# Patient Record
Sex: Male | Born: 1955
Health system: Southern US, Community
[De-identification: ages and names within clinical notes are randomized; demographics above are authoritative.]

## PROBLEM LIST (undated history)

## (undated) DIAGNOSIS — F329 Major depressive disorder, single episode, unspecified: Secondary | ICD-10-CM

## (undated) DIAGNOSIS — G4730 Sleep apnea, unspecified: Secondary | ICD-10-CM

## (undated) DIAGNOSIS — M75102 Unspecified rotator cuff tear or rupture of left shoulder, not specified as traumatic: Secondary | ICD-10-CM

## (undated) DIAGNOSIS — M1990 Unspecified osteoarthritis, unspecified site: Secondary | ICD-10-CM

## (undated) DIAGNOSIS — E039 Hypothyroidism, unspecified: Secondary | ICD-10-CM

## (undated) DIAGNOSIS — M5030 Other cervical disc degeneration, unspecified cervical region: Secondary | ICD-10-CM

## (undated) DIAGNOSIS — R7303 Prediabetes: Secondary | ICD-10-CM

## (undated) DIAGNOSIS — F32A Depression, unspecified: Secondary | ICD-10-CM

## (undated) DIAGNOSIS — I8390 Asymptomatic varicose veins of unspecified lower extremity: Secondary | ICD-10-CM

## (undated) DIAGNOSIS — G5603 Carpal tunnel syndrome, bilateral upper limbs: Secondary | ICD-10-CM

## (undated) DIAGNOSIS — N400 Enlarged prostate without lower urinary tract symptoms: Secondary | ICD-10-CM

## (undated) DIAGNOSIS — E785 Hyperlipidemia, unspecified: Secondary | ICD-10-CM

## (undated) DIAGNOSIS — F909 Attention-deficit hyperactivity disorder, unspecified type: Secondary | ICD-10-CM

## (undated) DIAGNOSIS — R000 Tachycardia, unspecified: Secondary | ICD-10-CM

## (undated) DIAGNOSIS — R7301 Impaired fasting glucose: Secondary | ICD-10-CM

## (undated) DIAGNOSIS — M069 Rheumatoid arthritis, unspecified: Secondary | ICD-10-CM

## (undated) DIAGNOSIS — F419 Anxiety disorder, unspecified: Secondary | ICD-10-CM

## (undated) DIAGNOSIS — R9439 Abnormal result of other cardiovascular function study: Secondary | ICD-10-CM

## (undated) DIAGNOSIS — D649 Anemia, unspecified: Secondary | ICD-10-CM

## (undated) HISTORY — DX: Rheumatoid arthritis, unspecified: M06.9

## (undated) HISTORY — PX: HAMMER TOE SURGERY: SHX385

## (undated) HISTORY — DX: Tachycardia, unspecified: R00.0

## (undated) HISTORY — DX: Unspecified rotator cuff tear or rupture of left shoulder, not specified as traumatic: M75.102

## (undated) HISTORY — DX: Unspecified osteoarthritis, unspecified site: M19.90

## (undated) HISTORY — DX: Carpal tunnel syndrome, bilateral upper limbs: G56.03

## (undated) HISTORY — DX: Depression, unspecified: F32.A

## (undated) HISTORY — DX: Other cervical disc degeneration, unspecified cervical region: M50.30

## (undated) HISTORY — DX: Anxiety disorder, unspecified: F41.9

## (undated) HISTORY — PX: ELBOW SURGERY: SHX618

## (undated) HISTORY — DX: Abnormal result of other cardiovascular function study: R94.39

## (undated) HISTORY — DX: Prediabetes: R73.03

## (undated) HISTORY — DX: Attention-deficit hyperactivity disorder, unspecified type: F90.9

## (undated) HISTORY — DX: Hypothyroidism, unspecified: E03.9

## (undated) HISTORY — PX: WRIST SURGERY: SHX841

## (undated) HISTORY — DX: Anemia, unspecified: D64.9

## (undated) HISTORY — DX: Sleep apnea, unspecified: G47.30

## (undated) HISTORY — DX: Benign prostatic hyperplasia without lower urinary tract symptoms: N40.0

## (undated) HISTORY — DX: Major depressive disorder, single episode, unspecified: F32.9

## (undated) HISTORY — DX: Asymptomatic varicose veins of unspecified lower extremity: I83.90

## (undated) HISTORY — DX: Hyperlipidemia, unspecified: E78.5

## (undated) HISTORY — PX: HUMERUS SURGERY: SHX672

## (undated) HISTORY — PX: BUNIONECTOMY: SHX129

## (undated) HISTORY — DX: Impaired fasting glucose: R73.01

---

## 2002-11-12 ENCOUNTER — Encounter: Admission: RE | Admit: 2002-11-12 | Discharge: 2002-11-12 | Payer: Self-pay | Admitting: Family Medicine

## 2002-11-12 ENCOUNTER — Encounter: Payer: Self-pay | Admitting: Family Medicine

## 2003-04-28 ENCOUNTER — Ambulatory Visit (HOSPITAL_COMMUNITY): Admission: RE | Admit: 2003-04-28 | Discharge: 2003-04-28 | Payer: Self-pay | Admitting: Gastroenterology

## 2004-01-18 ENCOUNTER — Encounter: Admission: RE | Admit: 2004-01-18 | Discharge: 2004-04-17 | Payer: Self-pay | Admitting: Family Medicine

## 2010-12-03 HISTORY — PX: SHOULDER ARTHROSCOPY W/ ROTATOR CUFF REPAIR: SHX2400

## 2010-12-19 ENCOUNTER — Ambulatory Visit
Admission: RE | Admit: 2010-12-19 | Discharge: 2010-12-20 | Payer: Self-pay | Source: Home / Self Care | Attending: Orthopedic Surgery | Admitting: Orthopedic Surgery

## 2011-04-03 HISTORY — PX: SHOULDER ARTHROSCOPY W/ ROTATOR CUFF REPAIR: SHX2400

## 2011-04-05 ENCOUNTER — Other Ambulatory Visit: Payer: Self-pay | Admitting: Orthopedic Surgery

## 2011-04-05 ENCOUNTER — Ambulatory Visit (HOSPITAL_BASED_OUTPATIENT_CLINIC_OR_DEPARTMENT_OTHER)
Admission: RE | Admit: 2011-04-05 | Discharge: 2011-04-06 | Disposition: A | Discharge status: Home / Self Care | Payer: BC Managed Care – PPO | Source: Ambulatory Visit | Attending: Orthopedic Surgery | Admitting: Orthopedic Surgery

## 2011-04-05 DIAGNOSIS — M75110 Incomplete rotator cuff tear or rupture of unspecified shoulder, not specified as traumatic: Secondary | ICD-10-CM | POA: Insufficient documentation

## 2011-04-05 DIAGNOSIS — Z01812 Encounter for preprocedural laboratory examination: Secondary | ICD-10-CM | POA: Insufficient documentation

## 2011-04-05 DIAGNOSIS — Z5333 Arthroscopic surgical procedure converted to open procedure: Secondary | ICD-10-CM | POA: Insufficient documentation

## 2011-04-05 LAB — POCT HEMOGLOBIN-HEMACUE: Hemoglobin: 14.1 g/dL (ref 13.0–17.0)

## 2011-04-12 NOTE — Op Note (Signed)
Paul [name], [name] NO.:  [id number]  MEDICAL RECORD NO.:  [id number]          PATIENT TYPE:  LOCATION:                                 FACILITY:  PHYSICIAN:  Katy Fitch. Kein Carlberg, M.D. DATE OF BIRTH:  [birth year]  DATE OF PROCEDURE:  04/05/2011 DATE OF DISCHARGE:                              OPERATIVE REPORT   PREOPERATIVE DIAGNOSIS:  Left rotator cuff tear with MRI documented partial tear of long head of biceps.  POSTOPERATIVE DIAGNOSES: 1. Grade 1-2 tear of subscapularis with minimal retraction. 2. Complete tear of supraspinatus with more than 3-cm retraction. 3. Partial tear of infraspinatus with more than 2-cm retraction with a     chronic degenerative pattern. 4. Also, inspection of acromioclavicular joint identifying no evidence     of acromioclavicular impingement.  OPERATIONS: 1. Diagnostic arthroscopy, left shoulder. 2. Arthroscopic debridement of intraarticular fragments of long head     of biceps and deep surface of rotator cuff tear including     subscapularis, supraspinatus, infraspinatus. 3. Subacromial decompression with bursectomy and partial     coracoacromial ligament release. 4. Open long head of biceps tenodesis to bicipital groove with a     grasping suture technique to rotator cuff. 5. Open reconstruction of retracted 2 tendon rotator cuff tear to     decorticated and profile-lowered greater tuberosity.  OPERATIONS:  Katy Fitch. Elvert Cumpton, MD  ASSISTANT:  Marveen Reeks Dasnoit, PA  ANESTHESIA:  General endotracheal supplemented by a left interscalene block.  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 Catha Gosselin for evaluation and management of bilateral shoulder pain.  Paul [name] was noted to have significant weakness of abduction, external rotation of both shoulders, and underwent in a staged manner bilateral  MRIs.  On the right, he had a retracted rotator cuff tear with subscapularis partial tear, very unfavorable AC anatomy, and underwent an arthroscopic decompression, subacromial debridement, open reconstruction of the rotator cuff, and open distal clavicle resection. He is doing very well on the right.  He then requested similar surgery on the left side after an MRI documented a large retracted rotator cuff tear.  His AC anatomy was more favorable on the left.  After detailed informed consent, he was brought to the operating room at this time, anticipating diagnostic arthroscopy of the left shoulder. Arthroscopic debridement of his long head of biceps, open reconstruction of the rotator cuff, and management of labral pathology and tendon pathology as our findings indicate.  Preoperatively, he was reminded of the potential risks and benefits of surgery.  Paul [name] is a Jehovah's Witness, and we have agreed to respect his choice to have no blood products.  We had discussed in detail with Paul [name] the plan for surgery which gently does not ever require blood transfusion.  Should we encounter very difficult bleeding, our choice would be to terminate surgery rather than to proceed with use of blood products.  Questions were invited and answered in detail at the office and in the holding area prior to surgery.  DESCRIPTION OF PROCEDURE:  Paul [name] was interviewed by Dr. Gypsy Balsam of Anesthesia and after detailed informed consent, general anesthesia by endotracheal technique supplemented by a left interscalene block was recommended and accepted by Paul [name].  He was brought to room #1 of the [facility] where under Dr. Burnett Corrente direct supervision, general anesthesia by endotracheal technique was induced.  He was carefully positioned in a beach-chair position with aid of a torso and head holder designed for shoulder arthroscopy.  One gram of Ancef was administered as  an IV prophylactic antibiotic.  Paul [name] was carefully positioned in a beach-chair position with aid of a torso and head holder designed for shoulder arthroscopy with passive compression devices on his calves for prevention of deep vein thrombosis.  The entire left upper extremity forequarter was prepped with DuraPrep and draped with impervious arthroscopy drapes.  The procedure was commenced with a routine surgical time-out.  Once this was accomplished, the arthroscope was introduced through a standard posterior viewing portal.  Diagnostic arthroscopy confirmed a near complete tear of long head of the biceps with fragments of the tendon hanging within the joint.  The rotator cuff was inspected and the subscapularis noted to have a 25% partial-thickness tear superiorly that was retracted.  There is reactive synovitis.  The supraspinatus was completely avulsed and retracted about 3 cm.  The infraspinatus was degenerative and retracted with the anterior 50% off the greater tuberosity.  There was a large reactive osteophyte at the greater tuberosity.  An anterior portal was created under direct vision followed by use of a 5.5-mm suction shaver to debride the rotator cuff.  The stump of long head of the biceps and to perform an anterior synovectomy, the free margin of the subscapularis were smoothed to a stable margin.  We elected not to perform a proper subscapularis repair as it was a partial tear.  The scope was then removed glenohumeral joint and placed in a subacromial space.  Florid bursitis was noted.  Thorough bursectomy was accomplished followed by meticulous inspection of the [facility] joint and subacromial anatomy.  There was anterolateral osteophyte that was reactive.  This was leveled to a type 1 morphology with a suction shaver and hemostasis achieved with bipolar cautery.  We noted no contour issues with the distal clavicle or medial acromion.  Therefore, I elected not to  perform distal clavicle resection.  Preoperative plain films and MRI did not document AC impingement.  After hemostasis was achieved with bipolar cautery, the scope was removed from the subacromial space.  A 4-cm anterior middle third deltoid splitting incision was fashioned followed by identification of a very hypertrophic bursa.  We encountered some problematic bleeding from the acromial branch of the coracoacromial artery which was dissected free, identified, clamped, and electrocauterized.  The bursa was extremely hypertrophic, therefore a bursectomy was accomplished with scissors and forceps followed by hemostasis.  The feeding vessels of the bursa were identified and electrocauterized.  The greater tuberosity had a large reactive osteophyte which was lowered 3-4 mm with a power burr.  A bleeding bone surface was created from the bicipital groove posteriorly to the posterior third of the infraspinatus.  The cuff morphology was studied and found to be a wide retracted U- shaped tear.  We are able to converge the anterior leaflet and the posterior leaflet by placement of fiber tape, 2 mattress sutures that were then used as a baseball stitch to close the margins of the tear and lateralize the tear.  A bowel corkscrew was placed  at the posterior aspect of the supraspinatus insertion to create a medial footprint.  A pair of mattress sutures were placed for proper medial inset.  The rotator cuff was then advanced with margin convergence and lateralization followed by inset of the cuff with a swivel lock laterally.  The 2 mattress sutures were then tied, creating excellent medial footprint.  The tails were secured with swivel lock and push lock fixation laterally.  A very satisfactory low-profile repair was achieved.  A hand rasp was used to feather the edge of the lateral acromion to a smooth and blunt margin.  Hemostasis was confirmed followed by repair of the deltoid split with  interrupted suture of 0 Vicryl followed by repair of the skin and subcutaneous suture of 2-0 Vicryl and intradermal 3-0 Prolene with Steri-Strips.  There were no apparent complications.  For aftercare, Paul [name] was placed in a large sling.  He was awakened from general anesthesia and transferred to the recovery room with stable vital signs.  He will be admitted to the recovery care center for observation of his vital signs.  IV antibiotics in the form of Ancef 1 g IV q.8 hours x3 doses and appropriate p.o. and IV analgesics.     Katy Fitch Julien Oscar, M.D.     RVS/MEDQ  D:  04/05/2011  T:  04/06/2011  Job:  [id number]  cc:   Caryn Bee L. Little, M.D.  Electronically Signed by Josephine Igo M.D. on 04/12/2011 12:20:24 PM
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
## 2014-09-24 ENCOUNTER — Encounter: Payer: Self-pay | Admitting: *Deleted

## 2016-06-06 ENCOUNTER — Emergency Department (HOSPITAL_COMMUNITY): Payer: BLUE CROSS/BLUE SHIELD

## 2016-06-06 ENCOUNTER — Encounter (HOSPITAL_COMMUNITY): Payer: Self-pay | Admitting: Emergency Medicine

## 2016-06-06 ENCOUNTER — Emergency Department (HOSPITAL_COMMUNITY)
Admission: EM | Admit: 2016-06-06 | Discharge: 2016-06-06 | Disposition: A | Discharge status: Home / Self Care | Payer: BLUE CROSS/BLUE SHIELD | Attending: Emergency Medicine | Admitting: Emergency Medicine

## 2016-06-06 DIAGNOSIS — E785 Hyperlipidemia, unspecified: Secondary | ICD-10-CM | POA: Diagnosis not present

## 2016-06-06 DIAGNOSIS — Z87891 Personal history of nicotine dependence: Secondary | ICD-10-CM | POA: Insufficient documentation

## 2016-06-06 DIAGNOSIS — R0789 Other chest pain: Secondary | ICD-10-CM | POA: Diagnosis not present

## 2016-06-06 DIAGNOSIS — E039 Hypothyroidism, unspecified: Secondary | ICD-10-CM | POA: Insufficient documentation

## 2016-06-06 DIAGNOSIS — Z79899 Other long term (current) drug therapy: Secondary | ICD-10-CM | POA: Diagnosis not present

## 2016-06-06 DIAGNOSIS — R0602 Shortness of breath: Secondary | ICD-10-CM | POA: Diagnosis not present

## 2016-06-06 DIAGNOSIS — Z7982 Long term (current) use of aspirin: Secondary | ICD-10-CM | POA: Insufficient documentation

## 2016-06-06 LAB — CBC
HEMATOCRIT: 38.1 % — AB (ref 39.0–52.0)
HEMOGLOBIN: 13.3 g/dL (ref 13.0–17.0)
MCH: 31.1 pg (ref 26.0–34.0)
MCHC: 34.9 g/dL (ref 30.0–36.0)
MCV: 89 fL (ref 78.0–100.0)
Platelets: 269 10*3/uL (ref 150–400)
RBC: 4.28 MIL/uL (ref 4.22–5.81)
RDW: 12.6 % (ref 11.5–15.5)
WBC: 5.2 10*3/uL (ref 4.0–10.5)

## 2016-06-06 LAB — BASIC METABOLIC PANEL
ANION GAP: 5 (ref 5–15)
BUN: 19 mg/dL (ref 6–20)
CHLORIDE: 101 mmol/L (ref 101–111)
CO2: 28 mmol/L (ref 22–32)
Calcium: 9.1 mg/dL (ref 8.9–10.3)
Creatinine, Ser: 1.05 mg/dL (ref 0.61–1.24)
GFR calc non Af Amer: >60 mL/min (ref >60)
Glucose, Bld: 109 mg/dL — ABNORMAL HIGH (ref 65–99)
POTASSIUM: 5.1 mmol/L (ref 3.5–5.1)
Sodium: 134 mmol/L — ABNORMAL LOW (ref 135–145)

## 2016-06-06 LAB — I-STAT TROPONIN, ED: Troponin i, poc: 0 ng/mL (ref 0.00–0.08)

## 2016-06-06 NOTE — ED Notes (Signed)
Patient transported to X-ray 

## 2016-06-06 NOTE — ED Notes (Signed)
Pt c/o sharp centralized chest pain since Sunday. Pt reports SOB but denies N/V.

## 2016-06-06 NOTE — ED Provider Notes (Signed)
CSN: [id number]     Arrival date & time 06/06/16  1009 History   First MD Initiated Contact with Patient 06/06/16 1028     Chief Complaint  Patient presents with  . Chest Pain     (Consider location/radiation/quality/duration/timing/severity/associated sxs/prior Treatment) Patient is a 60 y.o. male presenting with chest pain. The history is provided by the patient.  Chest Pain Associated symptoms: no abdominal pain, no back pain, no cough, no fever, no headache, no palpitations, no shortness of breath and not vomiting   Patient c/o a few episodes sudden sharp pain in mid chest, in the past week, each episode lasting 2-3 seconds. Occurred at rest, once while driving, another time at rest. No exertional cp or discomfort. Felt mildly sob.  No palpitations. No syncope. No hx cad. Father w hx cabg in his mid 6750's. Patient reports neg stress test several yrs ago. No hx cath. Non smoker. No cough or uri c/o. No fever or chills.       Past Medical History  Diagnosis Date  . Dyslipidemia   . Hypothyroidism   . BPH (benign prostatic hyperplasia)   . Varicose veins    History reviewed. No pertinent past surgical history. Family History  Problem Relation Age of Onset  . Family history unknown: Yes   Social History  Substance Use Topics  . Smoking status: Former Games developermoker  . Smokeless tobacco: None  . Alcohol Use: Yes    Review of Systems  Constitutional: Negative for fever and chills.  HENT: Negative for sore throat.   Eyes: Negative for redness.  Respiratory: Negative for cough and shortness of breath.   Cardiovascular: Positive for chest pain. Negative for palpitations and leg swelling.  Gastrointestinal: Negative for vomiting, abdominal pain and diarrhea.  Genitourinary: Negative for flank pain.  Musculoskeletal: Negative for back pain and neck pain.  Skin: Negative for rash.  Neurological: Negative for headaches.  Hematological: Does not bruise/bleed easily.   Psychiatric/Behavioral: Negative for confusion.      Allergies  Review of patient's allergies indicates no known allergies.  Home Medications   Prior to Admission medications   Medication Sig Start Date End Date Taking? Authorizing Provider  ALPRAZolam Prudy Feeler(XANAX) 0.25 MG tablet Take 0.25 mg by mouth at bedtime as needed for anxiety.    Historical Provider, MD  aspirin 81 MG chewable tablet Chew by mouth daily.    Historical Provider, MD  atorvastatin (LIPITOR) 20 MG tablet Take 20 mg by mouth daily.    Historical Provider, MD  b complex vitamins tablet Take 1 tablet by mouth daily.    Historical Provider, MD  Coenzyme Q10 10 MG capsule Take 10 mg by mouth daily.    Historical Provider, MD  levothyroxine (SYNTHROID, LEVOTHROID) 100 MCG tablet Take 100 mcg by mouth daily before breakfast.    Historical Provider, MD  naproxen (NAPROSYN) 500 MG tablet Take 500 mg by mouth 2 (two) times daily with a meal.    Historical Provider, MD   BP 133/90 mmHg  Pulse 58  Temp(Src) 97.3 F (36.3 C) (Oral)  Resp 14  SpO2 99% Physical Exam  Constitutional: He is oriented to person, place, and time. He appears well-developed and well-nourished. No distress.  HENT:  Mouth/Throat: Oropharynx is clear and moist.  Eyes: Conjunctivae are normal. No scleral icterus.  Neck: Neck supple. No tracheal deviation present.  Cardiovascular: Normal rate, regular rhythm, normal heart sounds and intact distal pulses.  Exam reveals no gallop and no friction rub.  No murmur heard. Pulmonary/Chest: Effort normal and breath sounds normal. No accessory muscle usage. No respiratory distress. He exhibits no tenderness.  Abdominal: Soft. Bowel sounds are normal. He exhibits no distension. There is no tenderness.  Musculoskeletal: Normal range of motion. He exhibits no edema or tenderness.  Neurological: He is alert and oriented to person, place, and time.  Skin: Skin is warm and dry. He is not diaphoretic.  Psychiatric: He  has a normal mood and affect.  Nursing note and vitals reviewed.   ED Course  Procedures (including critical care time) Labs Review   Results for orders placed or performed during the hospital encounter of 06/06/16  Basic metabolic panel  Result Value Ref Range   Sodium 134 (L) 135 - 145 mmol/L   Potassium 5.1 3.5 - 5.1 mmol/L   Chloride 101 101 - 111 mmol/L   CO2 28 22 - 32 mmol/L   Glucose, Bld 109 (H) 65 - 99 mg/dL   BUN 19 6 - 20 mg/dL   Creatinine, Ser 1.611.05 0.61 - 1.24 mg/dL   Calcium 9.1 8.9 - 09.610.3 mg/dL   GFR calc non Af Amer >60 >60 mL/min   GFR calc Af Amer >60 >60 mL/min   Anion gap 5 5 - 15  CBC  Result Value Ref Range   WBC 5.2 4.0 - 10.5 K/uL   RBC 4.28 4.22 - 5.81 MIL/uL   Hemoglobin 13.3 13.0 - 17.0 g/dL   HCT 04.538.1 (L) 40.939.0 - 81.152.0 %   MCV 89.0 78.0 - 100.0 fL   MCH 31.1 26.0 - 34.0 pg   MCHC 34.9 30.0 - 36.0 g/dL   RDW 91.412.6 78.211.5 - 95.615.5 %   Platelets 269 150 - 400 K/uL  I-stat troponin, ED  Result Value Ref Range   Troponin i, poc 0.00 0.00 - 0.08 ng/mL   Comment 3           Dg Chest 2 View  06/06/2016  CLINICAL DATA:  Shortness of breath EXAM: CHEST  2 VIEW COMPARISON:  None. FINDINGS: Normal heart size and mediastinal contours. No acute infiltrate or edema. No effusion or pneumothorax. No acute osseous findings. IMPRESSION: No active cardiopulmonary disease. Electronically Signed   By: Marnee SpringJonathon  Watts M.D.   On: 06/06/2016 10:40       I have personally reviewed and evaluated these images and lab results as part of my medical decision-making.   EKG Interpretation   Date/Time:  Wednesday June 06 2016 10:19:39 EDT Ventricular Rate:  55 PR Interval:    QRS Duration: 104 QT Interval:  412 QTC Calculation: 394 R Axis:   26 Text Interpretation:  Sinus rhythm No significant change since last  tracing Confirmed by Denton LankSTEINL  MD, Caryn BeeKEVIN ([id number]) on 06/06/2016 10:29:20 AM  Also confirmed by Denton LankSTEINL  MD, Kyrel Leighton ([id number]), editor BREWER, CCT, GLENN  ([id number])  on  06/06/2016 10:30:12 AM      MDM   Iv ns. Monitor. Labs. Ecg.  Reviewed nursing notes and prior charts for additional history.   Recheck, no current cp or discomfort, and prior chest pain very atypical, lasting about 3 seconds.  Patient current symptom free, trop 0, and appears stable for d/c.   Given fam hx, will refer to cardiology f/u, possible outpatient stress testing.       Cathren LaineKevin Kayleena Eke, MD 06/06/16 [id number]

## 2016-06-06 NOTE — Discharge Instructions (Signed)
It was our pleasure to provide your ER care today - we hope that you feel better.  Follow up with cardiologist in the next 1-2 weeks - see referral - call office to arrange appointment.  Return to ER if worse, recurrent or persistent chest pain, increased difficulty breathing, weak/fainting, or other medical emergency.      Nonspecific Chest Pain  Chest pain can be caused by many different conditions. There is always a chance that your pain could be related to something serious, such as a heart attack or a blood clot in your lungs. Chest pain can also be caused by conditions that are not life-threatening. If you have chest pain, it is very important to follow up with your health care provider. CAUSES  Chest pain can be caused by:  Heartburn.  Pneumonia or bronchitis.  Anxiety or stress.  Inflammation around your heart (pericarditis) or lung (pleuritis or pleurisy).  A blood clot in your lung.  A collapsed lung (pneumothorax). It can develop suddenly on its own (spontaneous pneumothorax) or from trauma to the chest.  Shingles infection (varicella-zoster virus).  Heart attack.  Damage to the bones, muscles, and cartilage that make up your chest wall. This can include:  Bruised bones due to injury.  Strained muscles or cartilage due to frequent or repeated coughing or overwork.  Fracture to one or more ribs.  Sore cartilage due to inflammation (costochondritis). RISK FACTORS  Risk factors for chest pain may include:  Activities that increase your risk for trauma or injury to your chest.  Respiratory infections or conditions that cause frequent coughing.  Medical conditions or overeating that can cause heartburn.  Heart disease or family history of heart disease.  Conditions or health behaviors that increase your risk of developing a blood clot.  Having had chicken pox (varicella zoster). SIGNS AND SYMPTOMS Chest pain can feel like:  Burning or tingling on the  surface of your chest or deep in your chest.  Crushing, pressure, aching, or squeezing pain.  Dull or sharp pain that is worse when you move, cough, or take a deep breath.  Pain that is also felt in your back, neck, shoulder, or arm, or pain that spreads to any of these areas. Your chest pain may come and go, or it may stay constant. DIAGNOSIS Lab tests or other studies may be needed to find the cause of your pain. Your health care provider may have you take a test called an ambulatory ECG (electrocardiogram). An ECG records your heartbeat patterns at the time the test is performed. You may also have other tests, such as:  Transthoracic echocardiogram (TTE). During echocardiography, sound waves are used to create a picture of all of the heart structures and to look at how blood flows through your heart.  Transesophageal echocardiogram (TEE).This is a more advanced imaging test that obtains images from inside your body. It allows your health care provider to see your heart in finer detail.  Cardiac monitoring. This allows your health care provider to monitor your heart rate and rhythm in real time.  Holter monitor. This is a portable device that records your heartbeat and can help to diagnose abnormal heartbeats. It allows your health care provider to track your heart activity for several days, if needed.  Stress tests. These can be done through exercise or by taking medicine that makes your heart beat more quickly.  Blood tests.  Imaging tests. TREATMENT  Your treatment depends on what is causing your chest pain. Treatment  may include:  Medicines. These may include:  Acid blockers for heartburn.  Anti-inflammatory medicine.  Pain medicine for inflammatory conditions.  Antibiotic medicine, if an infection is present.  Medicines to dissolve blood clots.  Medicines to treat coronary artery disease.  Supportive care for conditions that do not require medicines. This may  include:  Resting.  Applying heat or cold packs to injured areas.  Limiting activities until pain decreases. HOME CARE INSTRUCTIONS  If you were prescribed an antibiotic medicine, finish it all even if you start to feel better.  Avoid any activities that bring on chest pain.  Do not use any tobacco products, including cigarettes, chewing tobacco, or electronic cigarettes. If you need help quitting, ask your health care provider.  Do not drink alcohol.  Take medicines only as directed by your health care provider.  Keep all follow-up visits as directed by your health care provider. This is important. This includes any further testing if your chest pain does not go away.  If heartburn is the cause for your chest pain, you may be told to keep your head raised (elevated) while sleeping. This reduces the chance that acid will go from your stomach into your esophagus.  Make lifestyle changes as directed by your health care provider. These may include:  Getting regular exercise. Ask your health care provider to suggest some activities that are safe for you.  Eating a heart-healthy diet. A registered dietitian can help you to learn healthy eating options.  Maintaining a healthy weight.  Managing diabetes, if necessary.  Reducing stress. SEEK MEDICAL CARE IF:  Your chest pain does not go away after treatment.  You have a rash with blisters on your chest.  You have a fever. SEEK IMMEDIATE MEDICAL CARE IF:   Your chest pain is worse.  You have an increasing cough, or you cough up blood.  You have severe abdominal pain.  You have severe weakness.  You faint.  You have chills.  You have sudden, unexplained chest discomfort.  You have sudden, unexplained discomfort in your arms, back, neck, or jaw.  You have shortness of breath at any time.  You suddenly start to sweat, or your skin gets clammy.  You feel nauseous or you vomit.  You suddenly feel light-headed or  dizzy.  Your heart begins to beat quickly, or it feels like it is skipping beats. These symptoms may represent a serious problem that is an emergency. Do not wait to see if the symptoms will go away. Get medical help right away. Call your local emergency services (911 in the [place] Do not drive yourself to the hospital.   This information is not intended to replace advice given to you by your health care provider. Make sure you discuss any questions you have with your health care provider.   Document Released: 08/29/2005 Document Revised: 12/10/2014 Document Reviewed: 06/25/2014 Elsevier Interactive Patient Education [employer]2016 Elsevier Inc.

## 2016-06-06 NOTE — ED Notes (Signed)
Discharge instructions and follow up care reviewed with patient. Patient verbalized understanding. 

## 2016-06-12 DIAGNOSIS — R7301 Impaired fasting glucose: Secondary | ICD-10-CM | POA: Diagnosis not present

## 2016-06-22 ENCOUNTER — Encounter: Payer: Self-pay | Admitting: Cardiovascular Disease
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
## 2016-07-11 ENCOUNTER — Telehealth: Payer: Self-pay | Admitting: Cardiovascular Disease

## 2016-07-11 ENCOUNTER — Ambulatory Visit (INDEPENDENT_AMBULATORY_CARE_PROVIDER_SITE_OTHER)
Admission: RE | Admit: 2016-07-11 | Discharge: 2016-07-11 | Disposition: A | Discharge status: Home / Self Care | Payer: Self-pay | Source: Ambulatory Visit | Attending: Cardiovascular Disease | Admitting: Cardiovascular Disease

## 2016-07-11 ENCOUNTER — Encounter: Payer: Self-pay | Admitting: Cardiovascular Disease

## 2016-07-11 ENCOUNTER — Ambulatory Visit (INDEPENDENT_AMBULATORY_CARE_PROVIDER_SITE_OTHER): Payer: BLUE CROSS/BLUE SHIELD | Admitting: Cardiovascular Disease

## 2016-07-11 VITALS — BP 115/72 | HR 69 | Ht 73.0 in | Wt 191.0 lb

## 2016-07-11 DIAGNOSIS — Z7689 Persons encountering health services in other specified circumstances: Secondary | ICD-10-CM

## 2016-07-11 DIAGNOSIS — Z7189 Other specified counseling: Secondary | ICD-10-CM | POA: Diagnosis not present

## 2016-07-11 DIAGNOSIS — R079 Chest pain, unspecified: Secondary | ICD-10-CM | POA: Diagnosis not present

## 2016-07-11 NOTE — Telephone Encounter (Signed)
New message      Pt would like to discuss the coronary calcium score. Please call.

## 2016-07-11 NOTE — Telephone Encounter (Signed)
Called patient about CT cardiac Score results. Per Dr. Eden EmmsNishan calcium score is 109 which is higher than average for age will see how ETT looks but may be worthwhile to take statin 3x/week at very low dose and try to get LDL under 100. Start taking baby aspirin. Patient is concerned. Informed patient that Dr. Eden EmmsNishan wants to see how his ETT looks first. Dr. Eden EmmsNishan is fine with patient coming in to discuss after ETT test is done.

## 2016-07-11 NOTE — Patient Instructions (Addendum)
Medication Instructions:  Your physician recommends that you continue on your current medications as directed. Please refer to the Current Medication list given to you today.  Labwork: NONE  Testing/Procedures: Your physician has requested that you have cardiac CT calcium score. Cardiac computed tomography (CT) is a painless test that uses an x-ray machine to take clear, detailed pictures of your heart. For further information please visit [email or web address]. Please follow instruction sheet as given.  Your physician has requested that you have an exercise tolerance test. For further information please visit [email or web address]. Please also follow instruction sheet, as given.  Follow-Up: Your physician wants you to follow-up in: 6 months with Dr. Eden EmmsNishan. You will receive a reminder letter in the mail two months in advance. If you don't receive a letter, please call our office to schedule the follow-up appointment.   If you need a refill on your cardiac medications before your next appointment, please call your pharmacy.

## 2016-07-12 NOTE — Telephone Encounter (Signed)
Called patient back to schedule an appointment with Dr. Eden EmmsNishan after his ETT. Patient does not have time right now to make an appointment. Will call patient back to schedule.

## 2016-07-13 MED ORDER — ASPIRIN EC 81 MG PO TBEC: 81.0000 mg | DELAYED_RELEASE_TABLET | Freq: Every day | ORAL | 3 refills | Status: AC

## 2016-07-13 NOTE — Telephone Encounter (Signed)
Called patient back with an appointment on 08/03/16 follow up after ETT. Patient verbalized understanding.

## 2016-07-24 ENCOUNTER — Telehealth: Payer: Self-pay | Admitting: Cardiovascular Disease

## 2016-07-24 NOTE — Telephone Encounter (Signed)
New Message  Pt wife was called to reschedule appt. Pt wife refused next avail for provider and PA. Pt wife asked to speak with RN to get pt seen sooner. Please call back to discuss

## 2016-07-25 NOTE — Telephone Encounter (Signed)
Follow up     Patient wife calling wants the nurse to call her back regarding r/s appt  With Dr. Eden EmmsNishan on  9.1.2017 due to MD will not be in the office.

## 2016-07-25 NOTE — Telephone Encounter (Signed)
Called patient's wife back. Dr. Eden EmmsNishan will be in the office in the morning of 08/03/16, and there is no reason for the appt to be moved. Patient is keeping his appt.

## 2016-07-26 ENCOUNTER — Ambulatory Visit (INDEPENDENT_AMBULATORY_CARE_PROVIDER_SITE_OTHER): Payer: BLUE CROSS/BLUE SHIELD

## 2016-07-26 DIAGNOSIS — R079 Chest pain, unspecified: Secondary | ICD-10-CM

## 2016-07-26 LAB — EXERCISE TOLERANCE TEST
CHL CUP MPHR: 162 {beats}/min
CHL CUP RESTING HR STRESS: 54 {beats}/min
CSEPEDS: 16 s
CSEPHR: 87 %
Estimated workload: 12.1 METS
Exercise duration (min): 10 min
Peak HR: 142 {beats}/min
RPE: 17
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
## 2016-08-03 ENCOUNTER — Ambulatory Visit (INDEPENDENT_AMBULATORY_CARE_PROVIDER_SITE_OTHER): Payer: BLUE CROSS/BLUE SHIELD | Admitting: Cardiovascular Disease

## 2016-08-03 ENCOUNTER — Encounter: Payer: Self-pay | Admitting: Cardiovascular Disease

## 2016-08-03 VITALS — BP 120/74 | HR 61 | Ht 72.0 in | Wt 192.1 lb

## 2016-08-03 DIAGNOSIS — Z79899 Other long term (current) drug therapy: Secondary | ICD-10-CM

## 2016-08-03 DIAGNOSIS — Z09 Encounter for follow-up examination after completed treatment for conditions other than malignant neoplasm: Secondary | ICD-10-CM | POA: Diagnosis not present

## 2016-08-03 MED ORDER — EZETIMIBE 10 MG PO TABS: 10.0000 mg | ORAL_TABLET | Freq: Every day | ORAL | 3 refills | Status: DC

## 2016-08-03 NOTE — Patient Instructions (Addendum)
Medication Instructions:  Your physician has recommended you make the following change in your medication:  1-START- Zetia 10 mg by mouth dialy.  Labwork: Your physician recommends that you return for lab work in: 3 months with fasting lipid and liver panel.  Testing/Procedures: NONE  Follow-Up: Your physician wants you to follow-up in: 6 months with Dr. Eden EmmsNishan. You will receive a reminder letter in the mail two months in advance. If you don't receive a letter, please call our office to schedule the follow-up appointment.  Your physician recommends that you schedule a follow-up appointment in: 3 months with lipid clinic.   If you need a refill on your cardiac medications before your next appointment, please call your pharmacy.

## 2016-09-26 DIAGNOSIS — R7301 Impaired fasting glucose: Secondary | ICD-10-CM | POA: Diagnosis not present

## 2016-09-26 DIAGNOSIS — R413 Other amnesia: Secondary | ICD-10-CM | POA: Diagnosis not present

## 2016-09-26 DIAGNOSIS — Z23 Encounter for immunization: Secondary | ICD-10-CM | POA: Diagnosis not present

## 2016-10-04 ENCOUNTER — Encounter: Payer: BLUE CROSS/BLUE SHIELD | Attending: Family Medicine | Admitting: Dietician

## 2016-10-04 DIAGNOSIS — R7303 Prediabetes: Secondary | ICD-10-CM

## 2016-10-04 DIAGNOSIS — Z6825 Body mass index (BMI) 25.0-25.9, adult: Secondary | ICD-10-CM | POA: Insufficient documentation

## 2016-10-04 DIAGNOSIS — Z713 Dietary counseling and surveillance: Secondary | ICD-10-CM | POA: Insufficient documentation

## 2016-10-04 DIAGNOSIS — R7301 Impaired fasting glucose: Secondary | ICD-10-CM | POA: Insufficient documentation

## 2016-10-04 NOTE — Progress Notes (Signed)
  Medical Nutrition Therapy:  Appt start time: 0815 end time:  0915.   Assessment:  Primary concerns today: Paul [name] is here today with his wife since he is trying to get his Hgb A1c down. It has been high for the past two years. Last reading was 6.2%. [facility] for prediabetes and did better for awhile and has fallen off. Thinks he is getting too many carbs and not exercising.  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 Doing more managing and less hands on work. Working 8-12 hours per day. States that his wife Paul [name] does the meal preparation at home. Does not miss or skip meals and snacks often. Eats about about 8 meals per week though trying to cut back. Trying to eat better this week.   Feels like he is "always hungry" and his portions might be too big. Would like to know how to make proper food choices.   Preferred Learning Style:   No preference indicated   Learning Readiness:   Ready  MEDICATIONS: see list   DIETARY INTAKE:  Usual eating pattern includes 3 meals and 3 snacks per day.  Avoided foods include: none    24-hr recall:  B ( AM): 1 cup whole milk greek yogurt with berries with 1/2 cup walnuts and stevia    Snk ( AM): peanuts   L ( PM): low carb wrap with meat and hummus or if he goes out burgers or [place] spinach wrap or subway foot long [place]key sandwich  Snk ( PM): peanuts or sunflower seeds or Atkins bars  D ( PM): burger or pizza with salad or salmon/chicken or salad if cooks at home Snk ( PM): parmesan crisps or bean crisps or beanito chips with hummus/guacamole and vegetables  Beverages: coffee, decaf coffee with stevia, diet Dr. Eli HosePepper/Coke, seltzer, water  Usual physical activity: none recently, was working more  Estimated energy needs: 2000 calories 225 g carbohydrates 150 g protein 56 g fat  Progress Towards Goal(s):  In progress.   Nutritional Diagnosis:  NB-1.1 Food and nutrition-related knowledge  deficit As related to excess consumption of carbs and indequate exercise.  As evidenced by Hgb A1c of 6.2%.    Intervention:  Nutrition counseling provided. Goals:  Follow Diabetes Meal Plan as instructed  Eat 3 meals and 2 snacks, every 3-5 hrs  Limit carbohydrate intake to 45-60 grams carbohydrate/meal  Limit carbohydrate intake to 0-15 grams carbohydrate/snack  Add lean protein foods to meals/snacks  Aim to fill half of your with vegetables   Aim for 30 mins of physical activity daily (150 minutes for week)  Bring food record and glucose log to your next nutrition visit  Check out Calorie Brooke Dare[employer] app for looking up carbs in food  Teaching Method Utilized:  Visual Auditory Hands on  Handouts given during visit include:  Living Well With Diabetes  Meal Card  15 g CHO Snacks  Barriers to learning/adherence to lifestyle change: none  Demonstrated degree of understanding via:  Teach Back   Monitoring/Evaluation:  Dietary intake, exercise, and body weight in 3 month(s).

## 2016-10-04 NOTE — Patient Instructions (Signed)
Goals:  Follow Diabetes Meal Plan as instructed  Eat 3 meals and 2 snacks, every 3-5 hrs  Limit carbohydrate intake to 45-60 grams carbohydrate/meal  Limit carbohydrate intake to 0-15 grams carbohydrate/snack  Add lean protein foods to meals/snacks  Aim to fill half of your with vegetables   Aim for 30 mins of physical activity daily (150 minutes for week)  Bring food record and glucose log to your next nutrition visit  Check out Calorie Brooke DareKing app for looking up carbs in food

## 2016-10-16 ENCOUNTER — Encounter: Payer: Self-pay | Admitting: Family Medicine

## 2016-10-19 ENCOUNTER — Encounter: Payer: Self-pay | Admitting: Neurology

## 2016-10-19 ENCOUNTER — Ambulatory Visit (INDEPENDENT_AMBULATORY_CARE_PROVIDER_SITE_OTHER): Payer: BLUE CROSS/BLUE SHIELD | Admitting: Neurology

## 2016-10-19 VITALS — BP 105/70 | HR 67 | Resp 14 | Ht 73.0 in | Wt 189.5 lb

## 2016-10-19 DIAGNOSIS — R413 Other amnesia: Secondary | ICD-10-CM | POA: Insufficient documentation
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
## 2016-10-19 NOTE — Patient Instructions (Signed)
   We will check blood work today and get MRI of the brain. 

## 2016-10-26 LAB — METHYLMALONIC ACID, SERUM: Methylmalonic Acid: 160 nmol/L (ref 0–378)

## 2016-10-26 LAB — HIV ANTIBODY (ROUTINE TESTING W REFLEX): HIV SCREEN 4TH GENERATION: NONREACTIVE

## 2016-10-26 LAB — SEDIMENTATION RATE: SED RATE: 2 mm/h (ref 0–30)

## 2016-10-26 LAB — VITAMIN B12: VITAMIN B 12: 561 pg/mL (ref 211–946)

## 2016-10-26 LAB — RPR: RPR Ser Ql: NONREACTIVE

## 2016-10-26 LAB — COPPER, SERUM: Copper: 122 ug/dL (ref 72–166)

## 2016-10-31 ENCOUNTER — Ambulatory Visit (INDEPENDENT_AMBULATORY_CARE_PROVIDER_SITE_OTHER): Payer: BLUE CROSS/BLUE SHIELD

## 2016-10-31 DIAGNOSIS — R413 Other amnesia: Secondary | ICD-10-CM | POA: Diagnosis not present

## 2016-11-02 ENCOUNTER — Telehealth: Payer: Self-pay | Admitting: Neurology

## 2016-11-02 NOTE — Telephone Encounter (Signed)
I called the patient. MRI the brain was normal. If he desires to go on Aricept, he will call our office.   MRI brain 01/02/16:  IMPRESSION:  This is a normal MRI of the brain without contrast. There are no acute findings.

## 2016-11-06 ENCOUNTER — Other Ambulatory Visit (INDEPENDENT_AMBULATORY_CARE_PROVIDER_SITE_OTHER): Payer: BLUE CROSS/BLUE SHIELD

## 2016-11-06 DIAGNOSIS — Z79899 Other long term (current) drug therapy: Secondary | ICD-10-CM | POA: Diagnosis not present

## 2016-11-06 LAB — LIPID PANEL
CHOL/HDL RATIO: 2.2 ratio (ref <5.0)
CHOLESTEROL: 168 mg/dL (ref <200)
HDL: 75 mg/dL (ref >40)
LDL Cholesterol: 85 mg/dL (ref <100)
TRIGLYCERIDES: 41 mg/dL (ref <150)
VLDL: 8 mg/dL (ref <30)

## 2016-11-06 LAB — HEPATIC FUNCTION PANEL
ALT: 28 U/L (ref 9–46)
AST: 31 U/L (ref 10–35)
Albumin: 4.2 g/dL (ref 3.6–5.1)
Alkaline Phosphatase: 51 U/L (ref 40–115)
BILIRUBIN DIRECT: 0.1 mg/dL (ref <=0.2)
BILIRUBIN INDIRECT: 0.5 mg/dL (ref 0.2–1.2)
BILIRUBIN TOTAL: 0.6 mg/dL (ref 0.2–1.2)
Total Protein: 6.6 g/dL (ref 6.1–8.1)

## 2016-11-12 DIAGNOSIS — M25562 Pain in left knee: Secondary | ICD-10-CM | POA: Diagnosis not present

## 2016-11-13 ENCOUNTER — Ambulatory Visit: Payer: BLUE CROSS/BLUE SHIELD

## 2016-12-27 DIAGNOSIS — Z0000 Encounter for general adult medical examination without abnormal findings: Secondary | ICD-10-CM | POA: Diagnosis not present

## 2016-12-28 DIAGNOSIS — E039 Hypothyroidism, unspecified: Secondary | ICD-10-CM | POA: Diagnosis not present

## 2016-12-28 DIAGNOSIS — E785 Hyperlipidemia, unspecified: Secondary | ICD-10-CM | POA: Diagnosis not present

## 2016-12-28 DIAGNOSIS — Z0000 Encounter for general adult medical examination without abnormal findings: Secondary | ICD-10-CM | POA: Diagnosis not present

## 2016-12-28 DIAGNOSIS — N400 Enlarged prostate without lower urinary tract symptoms: Secondary | ICD-10-CM | POA: Diagnosis not present

## 2016-12-28 DIAGNOSIS — R7301 Impaired fasting glucose: Secondary | ICD-10-CM | POA: Diagnosis not present
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
## 2017-01-21 ENCOUNTER — Encounter: Payer: Self-pay | Admitting: Cardiovascular Disease

## 2017-01-22 ENCOUNTER — Ambulatory Visit: Payer: BLUE CROSS/BLUE SHIELD | Admitting: Dietician

## 2017-01-22 ENCOUNTER — Encounter: Payer: BLUE CROSS/BLUE SHIELD | Attending: Family Medicine | Admitting: Registered"

## 2017-01-22 DIAGNOSIS — Z713 Dietary counseling and surveillance: Secondary | ICD-10-CM | POA: Insufficient documentation

## 2017-01-22 DIAGNOSIS — R7301 Impaired fasting glucose: Secondary | ICD-10-CM | POA: Diagnosis not present

## 2017-01-22 DIAGNOSIS — R739 Hyperglycemia, unspecified: Secondary | ICD-10-CM

## 2017-01-22 DIAGNOSIS — Z6824 Body mass index (BMI) 24.0-24.9, adult: Secondary | ICD-10-CM | POA: Diagnosis not present

## 2017-01-22 NOTE — Patient Instructions (Addendum)
Plan: Consider adding additional physical activity in your daily routine, breaks at work Consider adding oatmeal and almonds to your diet Do the Feta and Blue cheese in moderation

## 2017-01-22 NOTE — Progress Notes (Signed)
  Medical Nutrition Therapy:  Appt start time: 1030 end time:  1100.   Assessment:  Primary concerns today: Follow-up appointment. Patient would like to know if he is making the right choices to bring down his A1c. Patient reports that his last A1c reading was 6.2% and has not had an updated test. Patient brought in food diary. Looks like choices are appropriate for diabetes. Patient reports increased physical activity in the last 3 weeks.  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    Preferred Learning Style:   No preference indicated   Learning Readiness:   Ready  MEDICATIONS: see list   DIETARY INTAKE:  Usual eating pattern includes 3 meals and 3 snacks per day.  Avoided foods include: none    24-hr recall:  B ( AM): [place] plain yogurt, berries, walnuts  Snk ( AM): peanuts   L ( PM): low carb wrap with meat and hummus or if he goes out burgers or [place] spinach wrap or subway foot long [place]key sandwich  Snk ( PM): peanuts or sunflower seeds or Atkins bars  D ( PM): salad, feta, olives, grilled chicken. OR salmon/chicken or salad if cooks at home. Snk ( PM): parmesan crisps or bean crisps or beanito chips with hummus/guacamole and vegetables  Beverages: coffee, decaf coffee with stevia, diet soda, seltzer, water  Usual physical activity: 3-4 days week on the treadmill / 20-25 min  Estimated energy needs: 2000 calories 225 g carbohydrates 150 g protein 56 g fat  Progress Towards Goal(s):  In progress.   Nutritional Diagnosis:  NB-1.1 Food and nutrition-related knowledge deficit As related to excess consumption of carbs and indequate exercise.  As evidenced by Hgb A1c of 6.2%.    Intervention:  Nutrition counseling provided. Goals: Follow Diabetes Meal Plan as instructed Consider adding oatmeal and almonds to your diet Do the Feta and Blue cheese in moderation Limit carbohydrate intake to 45-60 grams carbohydrate/meal Limit carbohydrate intake  to 0-15 grams carbohydrate/snack Add lean protein foods to meals/snacks Check out Calorie Brooke Dare[employer] app for looking up carbs in food Consider adding additional physical activity in your daily routine, breaks at work Continue 20-25 mins of physical activity daily (150 minutes for week)   Teaching Method Utilized:  Scientific laboratory technicianVisual Auditory  Handouts given during visit include:  (He brought handouts with him from last visit and we reviewed carb counting)  Barriers to learning/adherence to lifestyle change: none  Demonstrated degree of understanding via:  Teach Back   Monitoring/Evaluation:  Dietary intake, exercise, and body weight PRN.

## 2017-01-30 ENCOUNTER — Encounter: Payer: Self-pay | Admitting: Cardiovascular Disease

## 2017-01-30 ENCOUNTER — Ambulatory Visit (INDEPENDENT_AMBULATORY_CARE_PROVIDER_SITE_OTHER): Payer: BLUE CROSS/BLUE SHIELD | Admitting: Cardiovascular Disease

## 2017-01-30 VITALS — BP 100/64 | HR 64 | Ht 73.0 in | Wt 191.1 lb

## 2017-01-30 DIAGNOSIS — Z09 Encounter for follow-up examination after completed treatment for conditions other than malignant neoplasm: Secondary | ICD-10-CM | POA: Diagnosis not present

## 2017-01-30 DIAGNOSIS — Z79899 Other long term (current) drug therapy: Secondary | ICD-10-CM

## 2017-01-30 MED ORDER — EZETIMIBE 10 MG PO TABS: 10.0000 mg | ORAL_TABLET | Freq: Every day | ORAL | 3 refills | Status: DC

## 2017-01-30 NOTE — Patient Instructions (Addendum)

## 2017-02-28 DIAGNOSIS — F900 Attention-deficit hyperactivity disorder, predominantly inattentive type: Secondary | ICD-10-CM | POA: Diagnosis not present

## 2017-03-28 DIAGNOSIS — F900 Attention-deficit hyperactivity disorder, predominantly inattentive type: Secondary | ICD-10-CM | POA: Diagnosis not present

## 2017-04-09 ENCOUNTER — Telehealth: Payer: Self-pay

## 2017-04-09 DIAGNOSIS — Z79899 Other long term (current) drug therapy: Secondary | ICD-10-CM

## 2017-04-09 NOTE — Telephone Encounter (Signed)
Left message for patient to call back. Calling to schedule patient for repeat lab work in June-July time frame.

## 2017-04-09 NOTE — Telephone Encounter (Signed)
Follow up  ° ° °Patient returning call back to nurse  °

## 2017-04-09 NOTE — Telephone Encounter (Signed)
Called patient and made an appointment for repeat lab work for June. Patient verbalized understanding.

## 2017-04-09 NOTE — Telephone Encounter (Signed)
-----   Message from Ethelda ChickPamela Pate Ingalls, RN sent at 11/07/2016  9:19 AM EST ----- Patient needs lab work lipid/liver in June- 6 months from December.

## 2017-04-18 ENCOUNTER — Ambulatory Visit: Payer: BLUE CROSS/BLUE SHIELD | Admitting: Neurology

## 2017-05-22 ENCOUNTER — Other Ambulatory Visit: Payer: BLUE CROSS/BLUE SHIELD | Admitting: *Deleted

## 2017-05-22 DIAGNOSIS — Z79899 Other long term (current) drug therapy: Secondary | ICD-10-CM

## 2017-05-22 LAB — HEPATIC FUNCTION PANEL
ALK PHOS: 59 IU/L (ref 39–117)
ALT: 40 IU/L (ref 0–44)
AST: 51 IU/L — AB (ref 0–40)
Albumin: 4.2 g/dL (ref 3.5–5.5)
BILIRUBIN TOTAL: 0.6 mg/dL (ref 0.0–1.2)
BILIRUBIN, DIRECT: 0.18 mg/dL (ref 0.00–0.40)
Total Protein: 6.5 g/dL (ref 6.0–8.5)

## 2017-05-22 LAB — LIPID PANEL
CHOLESTEROL TOTAL: 165 mg/dL (ref 100–199)
Chol/HDL Ratio: 2.3 ratio (ref 0.0–5.0)
HDL: 73 mg/dL (ref >39)
LDL CALC: 84 mg/dL (ref 0–99)
TRIGLYCERIDES: 39 mg/dL (ref 0–149)
VLDL Cholesterol Cal: 8 mg/dL (ref 5–40)

## 2017-05-27 ENCOUNTER — Telehealth: Payer: Self-pay | Admitting: Cardiovascular Disease

## 2017-05-27 DIAGNOSIS — R945 Abnormal results of liver function studies: Secondary | ICD-10-CM

## 2017-05-27 DIAGNOSIS — R7989 Other specified abnormal findings of blood chemistry: Secondary | ICD-10-CM

## 2017-05-27 DIAGNOSIS — Z79899 Other long term (current) drug therapy: Secondary | ICD-10-CM

## 2017-05-27 NOTE — Telephone Encounter (Signed)
F/u Message  Pt states he is returning RN call from Friday. Please call back to discuss

## 2017-05-27 NOTE — Telephone Encounter (Signed)
Notes recorded by Wendall StadeNishan, Peter C, MD on 05/22/2017 at 8:26 PM EDT LFTls minimally elevated f/u LFTls and cholesterol 3 months LDL at goal  Patient aware of lab results. Patient will come in on 08/27/17 for repeat lab work.

## 2017-06-26 DIAGNOSIS — F900 Attention-deficit hyperactivity disorder, predominantly inattentive type: Secondary | ICD-10-CM | POA: Diagnosis not present

## 2017-07-30 DIAGNOSIS — F900 Attention-deficit hyperactivity disorder, predominantly inattentive type: Secondary | ICD-10-CM | POA: Diagnosis not present

## 2017-07-30 DIAGNOSIS — F410 Panic disorder [episodic paroxysmal anxiety] without agoraphobia: Secondary | ICD-10-CM | POA: Diagnosis not present

## 2017-07-30 DIAGNOSIS — F4322 Adjustment disorder with anxiety: Secondary | ICD-10-CM | POA: Diagnosis not present

## 2017-08-27 ENCOUNTER — Other Ambulatory Visit: Payer: BLUE CROSS/BLUE SHIELD

## 2017-08-27 DIAGNOSIS — R945 Abnormal results of liver function studies: Secondary | ICD-10-CM

## 2017-08-27 DIAGNOSIS — R7989 Other specified abnormal findings of blood chemistry: Secondary | ICD-10-CM | POA: Diagnosis not present

## 2017-08-27 DIAGNOSIS — Z79899 Other long term (current) drug therapy: Secondary | ICD-10-CM | POA: Diagnosis not present

## 2017-08-27 LAB — HEPATIC FUNCTION PANEL
ALBUMIN: 4.4 g/dL (ref 3.5–5.5)
ALK PHOS: 65 IU/L (ref 39–117)
ALT: 30 IU/L (ref 0–44)
AST: 34 IU/L (ref 0–40)
BILIRUBIN TOTAL: 0.9 mg/dL (ref 0.0–1.2)
Bilirubin, Direct: 0.2 mg/dL (ref 0.00–0.40)
TOTAL PROTEIN: 6.8 g/dL (ref 6.0–8.5)

## 2017-10-04 DIAGNOSIS — Z23 Encounter for immunization: Secondary | ICD-10-CM | POA: Diagnosis not present

## 2017-12-08 DIAGNOSIS — H02843 Edema of right eye, unspecified eyelid: Secondary | ICD-10-CM | POA: Diagnosis not present

## 2017-12-17 DIAGNOSIS — F410 Panic disorder [episodic paroxysmal anxiety] without agoraphobia: Secondary | ICD-10-CM | POA: Diagnosis not present

## 2017-12-17 DIAGNOSIS — F900 Attention-deficit hyperactivity disorder, predominantly inattentive type: Secondary | ICD-10-CM | POA: Diagnosis not present

## 2017-12-31 DIAGNOSIS — H00011 Hordeolum externum right upper eyelid: Secondary | ICD-10-CM | POA: Diagnosis not present
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
## 2018-01-17 ENCOUNTER — Ambulatory Visit (INDEPENDENT_AMBULATORY_CARE_PROVIDER_SITE_OTHER): Payer: BLUE CROSS/BLUE SHIELD | Admitting: Cardiovascular Disease

## 2018-01-17 ENCOUNTER — Encounter: Payer: Self-pay | Admitting: Cardiovascular Disease

## 2018-01-17 VITALS — BP 128/60 | HR 61 | Ht 73.0 in | Wt 182.5 lb

## 2018-01-17 DIAGNOSIS — Z09 Encounter for follow-up examination after completed treatment for conditions other than malignant neoplasm: Secondary | ICD-10-CM | POA: Diagnosis not present

## 2018-01-17 NOTE — Patient Instructions (Signed)

## 2018-01-23 ENCOUNTER — Encounter: Payer: Self-pay | Admitting: Rheumatology

## 2018-01-23 DIAGNOSIS — Z0000 Encounter for general adult medical examination without abnormal findings: Secondary | ICD-10-CM | POA: Diagnosis not present

## 2018-01-23 DIAGNOSIS — R7301 Impaired fasting glucose: Secondary | ICD-10-CM | POA: Diagnosis not present

## 2018-03-31 DIAGNOSIS — R799 Abnormal finding of blood chemistry, unspecified: Secondary | ICD-10-CM | POA: Diagnosis not present

## 2018-03-31 DIAGNOSIS — E039 Hypothyroidism, unspecified: Secondary | ICD-10-CM | POA: Diagnosis not present

## 2018-04-21 ENCOUNTER — Other Ambulatory Visit: Payer: Self-pay | Admitting: Cardiovascular Disease

## 2018-04-24 ENCOUNTER — Other Ambulatory Visit: Payer: Self-pay | Admitting: Cardiovascular Disease

## 2018-05-05 ENCOUNTER — Encounter: Payer: Self-pay | Admitting: Cardiovascular Disease

## 2018-05-19 ENCOUNTER — Encounter (HOSPITAL_COMMUNITY): Payer: Self-pay | Admitting: Emergency Medicine

## 2018-05-19 ENCOUNTER — Other Ambulatory Visit: Payer: Self-pay

## 2018-05-19 ENCOUNTER — Emergency Department (HOSPITAL_COMMUNITY)
Admission: EM | Admit: 2018-05-19 | Discharge: 2018-05-19 | Disposition: A | Discharge status: Home / Self Care | Payer: BLUE CROSS/BLUE SHIELD | Attending: Emergency Medicine | Admitting: Emergency Medicine

## 2018-05-19 DIAGNOSIS — M6281 Muscle weakness (generalized): Secondary | ICD-10-CM | POA: Insufficient documentation

## 2018-05-19 DIAGNOSIS — R531 Weakness: Secondary | ICD-10-CM | POA: Diagnosis not present

## 2018-05-19 DIAGNOSIS — Z87891 Personal history of nicotine dependence: Secondary | ICD-10-CM | POA: Insufficient documentation

## 2018-05-19 DIAGNOSIS — R5381 Other malaise: Secondary | ICD-10-CM | POA: Diagnosis not present

## 2018-05-19 DIAGNOSIS — R5383 Other fatigue: Secondary | ICD-10-CM

## 2018-05-19 DIAGNOSIS — Z79899 Other long term (current) drug therapy: Secondary | ICD-10-CM | POA: Diagnosis not present

## 2018-05-19 DIAGNOSIS — R252 Cramp and spasm: Secondary | ICD-10-CM | POA: Diagnosis not present

## 2018-05-19 DIAGNOSIS — E039 Hypothyroidism, unspecified: Secondary | ICD-10-CM | POA: Diagnosis not present

## 2018-05-19 DIAGNOSIS — R748 Abnormal levels of other serum enzymes: Secondary | ICD-10-CM | POA: Diagnosis not present

## 2018-05-19 LAB — URINALYSIS, ROUTINE W REFLEX MICROSCOPIC
BILIRUBIN URINE: NEGATIVE
GLUCOSE, UA: NEGATIVE mg/dL
HGB URINE DIPSTICK: NEGATIVE
Ketones, ur: NEGATIVE mg/dL
Leukocytes, UA: NEGATIVE
Nitrite: NEGATIVE
PH: 5 (ref 5.0–8.0)
Protein, ur: NEGATIVE mg/dL
SPECIFIC GRAVITY, URINE: 1.014 (ref 1.005–1.030)

## 2018-05-19 LAB — BASIC METABOLIC PANEL
ANION GAP: 7 (ref 5–15)
BUN: 17 mg/dL (ref 6–20)
CO2: 27 mmol/L (ref 22–32)
Calcium: 9.1 mg/dL (ref 8.9–10.3)
Chloride: 103 mmol/L (ref 101–111)
Creatinine, Ser: 1.2 mg/dL (ref 0.61–1.24)
GFR calc Af Amer: >60 mL/min (ref >60)
GFR calc non Af Amer: >60 mL/min (ref >60)
GLUCOSE: 97 mg/dL (ref 65–99)
POTASSIUM: 4.3 mmol/L (ref 3.5–5.1)
Sodium: 137 mmol/L (ref 135–145)

## 2018-05-19 LAB — CBC
HEMATOCRIT: 39.3 % (ref 39.0–52.0)
HEMOGLOBIN: 13 g/dL (ref 13.0–17.0)
MCH: 30.2 pg (ref 26.0–34.0)
MCHC: 33.1 g/dL (ref 30.0–36.0)
MCV: 91.4 fL (ref 78.0–100.0)
Platelets: 309 10*3/uL (ref 150–400)
RBC: 4.3 MIL/uL (ref 4.22–5.81)
RDW: 12.5 % (ref 11.5–15.5)
WBC: 6.5 10*3/uL (ref 4.0–10.5)

## 2018-05-19 LAB — HEPATIC FUNCTION PANEL
ALBUMIN: 3.4 g/dL — AB (ref 3.5–5.0)
ALK PHOS: 63 U/L (ref 38–126)
ALT: 49 U/L (ref 17–63)
AST: 46 U/L — AB (ref 15–41)
BILIRUBIN TOTAL: 1 mg/dL (ref 0.3–1.2)
Bilirubin, Direct: 0.2 mg/dL (ref 0.1–0.5)
Indirect Bilirubin: 0.8 mg/dL (ref 0.3–0.9)
Total Protein: 6.3 g/dL — ABNORMAL LOW (ref 6.5–8.1)

## 2018-05-19 LAB — CK: Total CK: 629 U/L — ABNORMAL HIGH (ref 49–397)

## 2018-05-19 LAB — CBG MONITORING, ED: Glucose-Capillary: 94 mg/dL (ref 65–99)

## 2018-05-19 LAB — TROPONIN I
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
## 2018-05-19 NOTE — ED Provider Notes (Signed)
Patient placed in Quick Look pathway, seen and evaluated   Chief Complaint: Pt  Has recent weakness  HPI:   Pt saw his primary care Md and was told to come here because his CK was 503.    ROS: nausea, abdominal discomfort, decreased appetite  Physical Exam:   Gen: No distress  Neuro: Awake and Alert  Skin: Warm    Focused Exam: heart rrr, lungs normal resp rate   Initiation of care has begun. The patient has been counseled on the process, plan, and necessity for staying for the completion/evaluation, and the remainder of the medical screening examination   Osie CheeksSofia, Leslie K, PA-C 05/19/18 1924    Wynetta FinesMessick, Peter C, MD 05/19/18 [phone]

## 2018-05-19 NOTE — Discharge Instructions (Addendum)
It was our pleasure to provide your ER care today - we hope that you feel better.  Rest. Drink plenty of fluids.  At times, your medication zetia can cause similar symptoms - hold/stop the zetia, and follow up with your doctor/cardiologist in the coming week for recheck.   Return to ER if worse, new symptoms, fevers, new or severe pain, other concern.   re

## 2018-05-19 NOTE — ED Triage Notes (Signed)
Patient is from home, states that he has been feeling fatigued, weakness and upset stomach for the last week.  No vomiting, but does have some nausea that comes and goes.  He went into his PCP today and had some bloodwork drawn, he has an elevated CPK at 503.  Denies any chest pain at this time.

## 2018-05-22 ENCOUNTER — Ambulatory Visit (INDEPENDENT_AMBULATORY_CARE_PROVIDER_SITE_OTHER): Payer: BLUE CROSS/BLUE SHIELD | Admitting: Cardiology

## 2018-05-22 ENCOUNTER — Encounter: Payer: Self-pay | Admitting: Cardiology

## 2018-05-22 VITALS — BP 106/68 | HR 64 | Ht 72.0 in | Wt 176.4 lb

## 2018-05-22 DIAGNOSIS — Z8249 Family history of ischemic heart disease and other diseases of the circulatory system: Secondary | ICD-10-CM

## 2018-05-22 DIAGNOSIS — R413 Other amnesia: Secondary | ICD-10-CM | POA: Diagnosis not present

## 2018-05-22 DIAGNOSIS — M6281 Muscle weakness (generalized): Secondary | ICD-10-CM
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
## 2018-05-22 NOTE — Patient Instructions (Addendum)
Medication Instructions: Your physician recommends that you continue on your current medications as directed. Please refer to the Current Medication list given to you today.   Labwork: TODAY: CK  Procedures/Testing: None  Follow-Up: Your physician recommends that you schedule a follow-up appointment with Dr.Nishan ( 1st available )   Any Additional Special Instructions Will Be Listed Below (If Applicable).     If you need a refill on your cardiac medications before your next appointment, please call your pharmacy.

## 2018-05-23 ENCOUNTER — Other Ambulatory Visit: Payer: Self-pay

## 2018-05-23 ENCOUNTER — Telehealth: Payer: Self-pay | Admitting: Cardiology

## 2018-05-23 DIAGNOSIS — E039 Hypothyroidism, unspecified: Secondary | ICD-10-CM | POA: Diagnosis not present

## 2018-05-23 DIAGNOSIS — R748 Abnormal levels of other serum enzymes: Secondary | ICD-10-CM | POA: Diagnosis not present

## 2018-05-23 DIAGNOSIS — Z8249 Family history of ischemic heart disease and other diseases of the circulatory system: Secondary | ICD-10-CM | POA: Diagnosis not present

## 2018-05-23 DIAGNOSIS — R899 Unspecified abnormal finding in specimens from other organs, systems and tissues: Secondary | ICD-10-CM | POA: Diagnosis not present

## 2018-05-23 LAB — CK: CK TOTAL: 388 U/L — AB (ref 24–204)

## 2018-05-23 NOTE — Telephone Encounter (Signed)
New Message   Pt's wife states that the pt had labs done and they received the results but want to know if we can put then on my chart or provide her a copy via her personal fax: [phone] to take to the pt's appointment he has today at 3:00pm with his pcp. Please call

## 2018-05-23 NOTE — Telephone Encounter (Signed)
Done

## 2018-05-27 DIAGNOSIS — M19039 Primary osteoarthritis, unspecified wrist: Secondary | ICD-10-CM | POA: Diagnosis not present

## 2018-05-27 DIAGNOSIS — M25531 Pain in right wrist: Secondary | ICD-10-CM | POA: Diagnosis not present

## 2018-06-09 DIAGNOSIS — R748 Abnormal levels of other serum enzymes: Secondary | ICD-10-CM | POA: Diagnosis not present

## 2018-06-10 DIAGNOSIS — F900 Attention-deficit hyperactivity disorder, predominantly inattentive type: Secondary | ICD-10-CM | POA: Diagnosis not present

## 2018-06-10 DIAGNOSIS — F411 Generalized anxiety disorder: Secondary | ICD-10-CM | POA: Diagnosis not present

## 2018-07-07 ENCOUNTER — Encounter: Payer: Self-pay | Admitting: Rheumatology

## 2018-07-07 DIAGNOSIS — E039 Hypothyroidism, unspecified: Secondary | ICD-10-CM | POA: Diagnosis not present

## 2018-07-22 DIAGNOSIS — F411 Generalized anxiety disorder: Secondary | ICD-10-CM | POA: Diagnosis not present

## 2018-07-22 DIAGNOSIS — F900 Attention-deficit hyperactivity disorder, predominantly inattentive type: Secondary | ICD-10-CM | POA: Diagnosis not present
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
## 2018-08-06 ENCOUNTER — Encounter

## 2018-08-07 ENCOUNTER — Encounter: Payer: Self-pay | Admitting: Cardiovascular Disease

## 2018-08-07 ENCOUNTER — Ambulatory Visit (INDEPENDENT_AMBULATORY_CARE_PROVIDER_SITE_OTHER): Payer: BLUE CROSS/BLUE SHIELD | Admitting: Cardiovascular Disease

## 2018-08-07 ENCOUNTER — Ambulatory Visit (INDEPENDENT_AMBULATORY_CARE_PROVIDER_SITE_OTHER)
Admission: RE | Admit: 2018-08-07 | Discharge: 2018-08-07 | Disposition: A | Discharge status: Home / Self Care | Payer: BLUE CROSS/BLUE SHIELD | Source: Ambulatory Visit | Attending: Cardiovascular Disease | Admitting: Cardiovascular Disease

## 2018-08-07 VITALS — BP 122/68 | HR 59 | Ht 72.0 in | Wt 181.5 lb

## 2018-08-07 DIAGNOSIS — Z8249 Family history of ischemic heart disease and other diseases of the circulatory system: Secondary | ICD-10-CM

## 2018-08-07 DIAGNOSIS — M791 Myalgia, unspecified site: Secondary | ICD-10-CM

## 2018-08-07 DIAGNOSIS — M6281 Muscle weakness (generalized): Secondary | ICD-10-CM | POA: Diagnosis not present

## 2018-08-07 MED ORDER — ROSUVASTATIN CALCIUM 5 MG PO TABS: 5.0000 mg | ORAL_TABLET | ORAL | 3 refills | Status: DC

## 2018-08-07 NOTE — Patient Instructions (Addendum)
Medication Instructions:  Your physician has recommended you make the following change in your medication:  1-START Crestor 5 mg by mouth Monday, Wednesday, and Friday.  Labwork: Your physician recommends that you have lab work today-   Testing/Procedures: Cardiac CT scanning for calcium score, (CAT scanning), is a noninvasive, special x-ray that produces cross-sectional images of the body using x-rays and a computer. CT scans help physicians diagnose and treat medical conditions. For some CT exams, a contrast material is used to enhance visibility in the area of the body being studied. CT scans provide greater clarity and reveal more details than regular x-ray exams.  Follow-Up: Your physician wants you to follow-up in: 3 months with Dr. Eden Emms.   If you need a refill on your cardiac medications before your next appointment, please call your pharmacy.

## 2018-08-08 LAB — CK: CK TOTAL: 394 U/L — AB (ref 24–204)
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
## 2018-09-02 ENCOUNTER — Ambulatory Visit (INDEPENDENT_AMBULATORY_CARE_PROVIDER_SITE_OTHER): Payer: Self-pay

## 2018-09-02 ENCOUNTER — Ambulatory Visit (HOSPITAL_COMMUNITY)
Admission: RE | Admit: 2018-09-02 | Discharge: 2018-09-02 | Disposition: A | Discharge status: Home / Self Care | Payer: BLUE CROSS/BLUE SHIELD | Source: Ambulatory Visit | Attending: Rheumatology | Admitting: Rheumatology

## 2018-09-02 ENCOUNTER — Encounter: Payer: Self-pay | Admitting: Rheumatology

## 2018-09-02 ENCOUNTER — Ambulatory Visit (INDEPENDENT_AMBULATORY_CARE_PROVIDER_SITE_OTHER): Payer: BLUE CROSS/BLUE SHIELD | Admitting: Rheumatology

## 2018-09-02 ENCOUNTER — Telehealth: Payer: Self-pay | Admitting: *Deleted

## 2018-09-02 VITALS — BP 101/65 | HR 63 | Resp 15 | Ht 71.5 in | Wt 180.0 lb

## 2018-09-02 DIAGNOSIS — M79671 Pain in right foot: Secondary | ICD-10-CM

## 2018-09-02 DIAGNOSIS — R911 Solitary pulmonary nodule: Secondary | ICD-10-CM | POA: Diagnosis not present

## 2018-09-02 DIAGNOSIS — M79672 Pain in left foot: Secondary | ICD-10-CM

## 2018-09-02 DIAGNOSIS — M79642 Pain in left hand: Secondary | ICD-10-CM | POA: Diagnosis not present

## 2018-09-02 DIAGNOSIS — M25512 Pain in left shoulder: Secondary | ICD-10-CM | POA: Diagnosis not present

## 2018-09-02 DIAGNOSIS — Z8679 Personal history of other diseases of the circulatory system: Secondary | ICD-10-CM | POA: Insufficient documentation

## 2018-09-02 DIAGNOSIS — G8929 Other chronic pain: Secondary | ICD-10-CM

## 2018-09-02 DIAGNOSIS — R748 Abnormal levels of other serum enzymes: Secondary | ICD-10-CM | POA: Diagnosis not present

## 2018-09-02 DIAGNOSIS — R252 Cramp and spasm: Secondary | ICD-10-CM | POA: Diagnosis not present

## 2018-09-02 DIAGNOSIS — F419 Anxiety disorder, unspecified: Secondary | ICD-10-CM

## 2018-09-02 DIAGNOSIS — R5383 Other fatigue: Secondary | ICD-10-CM

## 2018-09-02 DIAGNOSIS — M545 Low back pain, unspecified: Secondary | ICD-10-CM

## 2018-09-02 DIAGNOSIS — F339 Major depressive disorder, recurrent, unspecified: Secondary | ICD-10-CM | POA: Insufficient documentation

## 2018-09-02 DIAGNOSIS — R918 Other nonspecific abnormal finding of lung field: Secondary | ICD-10-CM | POA: Diagnosis not present

## 2018-09-02 DIAGNOSIS — Z8639 Personal history of other endocrine, nutritional and metabolic disease: Secondary | ICD-10-CM | POA: Insufficient documentation

## 2018-09-02 DIAGNOSIS — F329 Major depressive disorder, single episode, unspecified: Secondary | ICD-10-CM

## 2018-09-02 DIAGNOSIS — F32A Depression, unspecified: Secondary | ICD-10-CM

## 2018-09-02 DIAGNOSIS — M79641 Pain in right hand: Secondary | ICD-10-CM

## 2018-09-02 DIAGNOSIS — Z87438 Personal history of other diseases of male genital organs: Secondary | ICD-10-CM | POA: Insufficient documentation

## 2018-09-02 DIAGNOSIS — Z8659 Personal history of other mental and behavioral disorders: Secondary | ICD-10-CM | POA: Insufficient documentation

## 2018-09-02 NOTE — Progress Notes (Signed)
I discussed the chest x-ray finding with Dr. Margo Aye.  He described that obesity is consistent with a fecalith.  Patient was notified.

## 2018-09-02 NOTE — Telephone Encounter (Signed)
I called patient with chest x-ray results - negative, area of concern is stool per radiologist. Dr. Corliss Skains spoke with Dr. Margo Aye.

## 2018-09-08 ENCOUNTER — Telehealth: Payer: Self-pay | Admitting: *Deleted

## 2018-09-08 NOTE — Telephone Encounter (Signed)
Please place referral to [facility] for nerve conduction velocity. Thanks!

## 2018-09-08 NOTE — Progress Notes (Signed)
All the labs are normal except for elevated CK.  Please refer patient to [facility] for EMG nerve conduction velocity.

## 2018-09-08 NOTE — Telephone Encounter (Signed)
-----   Message from Pollyann Savoy, MD sent at 09/08/2018  1:00 PM EDT ----- All the labs are normal except for elevated CK.  Please refer patient to [facility] for EMG nerve conduction velocity.

## 2018-09-09 NOTE — Telephone Encounter (Signed)
Patient is scheduled at [facility]

## 2018-09-15 LAB — MYOSITIS ASSESSR PLUS JO-1 AUTOABS
EJ AUTOABS: NOT DETECTED
JO-1 AUTOABS: NEGATIVE AI
KU AUTOABS: NOT DETECTED
Mi-2 Autoabs: NOT DETECTED
OJ Autoabs: NOT DETECTED
PL-12 Autoabs: NOT DETECTED
PL-7 Autoabs: NOT DETECTED
SRP AUTOABS: NOT DETECTED

## 2018-09-15 LAB — PROTEIN ELECTROPHORESIS, SERUM, WITH REFLEX
ALBUMIN ELP: 4.5 g/dL (ref 3.8–4.8)
Alpha 1: 0.3 g/dL (ref 0.2–0.3)
Alpha 2: 0.7 g/dL (ref 0.5–0.9)
BETA 2: 0.4 g/dL (ref 0.2–0.5)
Beta Globulin: 0.5 g/dL (ref 0.4–0.6)
GAMMA GLOBULIN: 1.1 g/dL (ref 0.8–1.7)
Total Protein: 7.5 g/dL (ref 6.1–8.1)

## 2018-09-15 LAB — ALDOLASE: Aldolase: 5.1 U/L (ref <=8.1)

## 2018-09-15 LAB — ANA: Anti Nuclear Antibody(ANA): NEGATIVE

## 2018-09-15 LAB — VITAMIN D 25 HYDROXY (VIT D DEFICIENCY, FRACTURES): Vit D, 25-Hydroxy: 47 ng/mL (ref 30–100)

## 2018-09-15 LAB — HEPATITIS B CORE ANTIBODY, IGM: Hep B C IgM: NONREACTIVE

## 2018-09-15 LAB — HEPATITIS B SURFACE ANTIGEN: HEP B S AG: NONREACTIVE

## 2018-09-15 LAB — SEDIMENTATION RATE: SED RATE: 17 mm/h (ref 0–20)

## 2018-09-15 LAB — HEPATITIS C ANTIBODY
Hepatitis C Ab: NONREACTIVE
SIGNAL TO CUT-OFF: 0.01 (ref <1.00)

## 2018-09-15 LAB — URIC ACID: Uric Acid, Serum: 4.7 mg/dL (ref 4.0–8.0)

## 2018-09-15 LAB — TSH: TSH: 0.41 mIU/L (ref 0.40–4.50)

## 2018-09-15 LAB — CYCLIC CITRUL PEPTIDE ANTIBODY, IGG

## 2018-09-15 LAB — THIOPURINE METHYLTRANSFERASE (TPMT), RBC: Thiopurine Methyltransferase, RBC: 13 nmol/hr/mL RBC

## 2018-09-15 LAB — RHEUMATOID FACTOR: Rhuematoid fact SerPl-aCnc: <14 IU/mL (ref <14)

## 2018-09-15 LAB — IGG, IGA, IGM
IGG (IMMUNOGLOBIN G), SERUM: 1153 mg/dL (ref 600–1640)
IMMUNOGLOBULIN A: 210 mg/dL (ref 47–310)
IgM, Serum: 44 mg/dL — ABNORMAL LOW (ref 50–300)

## 2018-09-25 DIAGNOSIS — L821 Other seborrheic keratosis: Secondary | ICD-10-CM | POA: Diagnosis not present

## 2018-09-25 DIAGNOSIS — L570 Actinic keratosis: Secondary | ICD-10-CM | POA: Diagnosis not present

## 2018-09-26 DIAGNOSIS — M19072 Primary osteoarthritis, left ankle and foot: Secondary | ICD-10-CM

## 2018-09-26 DIAGNOSIS — M19041 Primary osteoarthritis, right hand: Secondary | ICD-10-CM | POA: Insufficient documentation

## 2018-09-26 DIAGNOSIS — M19071 Primary osteoarthritis, right ankle and foot: Secondary | ICD-10-CM | POA: Insufficient documentation

## 2018-09-26 DIAGNOSIS — M19042 Primary osteoarthritis, left hand: Secondary | ICD-10-CM | POA: Insufficient documentation

## 2018-09-26 DIAGNOSIS — M5136 Other intervertebral disc degeneration, lumbar region: Secondary | ICD-10-CM | POA: Insufficient documentation

## 2018-09-26 DIAGNOSIS — R748 Abnormal levels of other serum enzymes: Secondary | ICD-10-CM | POA: Insufficient documentation
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
## 2018-10-02 ENCOUNTER — Ambulatory Visit (INDEPENDENT_AMBULATORY_CARE_PROVIDER_SITE_OTHER): Payer: BLUE CROSS/BLUE SHIELD | Admitting: Rheumatology

## 2018-10-02 ENCOUNTER — Encounter: Payer: Self-pay | Admitting: Rheumatology

## 2018-10-02 VITALS — BP 124/71 | HR 59 | Resp 13 | Ht 72.0 in | Wt 181.0 lb

## 2018-10-02 DIAGNOSIS — M19072 Primary osteoarthritis, left ankle and foot: Secondary | ICD-10-CM

## 2018-10-02 DIAGNOSIS — M19071 Primary osteoarthritis, right ankle and foot: Secondary | ICD-10-CM | POA: Diagnosis not present

## 2018-10-02 DIAGNOSIS — R748 Abnormal levels of other serum enzymes: Secondary | ICD-10-CM | POA: Diagnosis not present

## 2018-10-02 DIAGNOSIS — M19042 Primary osteoarthritis, left hand: Secondary | ICD-10-CM

## 2018-10-02 DIAGNOSIS — M19041 Primary osteoarthritis, right hand: Secondary | ICD-10-CM | POA: Diagnosis not present

## 2018-10-02 DIAGNOSIS — M5136 Other intervertebral disc degeneration, lumbar region: Secondary | ICD-10-CM | POA: Diagnosis not present

## 2018-10-03 NOTE — Progress Notes (Signed)
CK is better

## 2018-10-07 LAB — CK: CK TOTAL: 290 U/L — AB (ref 44–196)

## 2018-10-07 LAB — ANTI-HMGCR AB IGG

## 2018-10-08 NOTE — Progress Notes (Signed)
Anti-statin antibody was negative.  CK is better.  I plan to repeat CK at the follow-up visit.

## 2018-10-14 ENCOUNTER — Ambulatory Visit (INDEPENDENT_AMBULATORY_CARE_PROVIDER_SITE_OTHER): Payer: BLUE CROSS/BLUE SHIELD | Admitting: Neurology

## 2018-10-14 ENCOUNTER — Telehealth: Payer: Self-pay | Admitting: *Deleted

## 2018-10-14 ENCOUNTER — Encounter: Payer: Self-pay | Admitting: Neurology

## 2018-10-14 DIAGNOSIS — G5603 Carpal tunnel syndrome, bilateral upper limbs: Secondary | ICD-10-CM

## 2018-10-14 DIAGNOSIS — M75102 Unspecified rotator cuff tear or rupture of left shoulder, not specified as traumatic: Secondary | ICD-10-CM | POA: Insufficient documentation

## 2018-10-14 DIAGNOSIS — R748 Abnormal levels of other serum enzymes: Secondary | ICD-10-CM

## 2018-10-14 DIAGNOSIS — R252 Cramp and spasm: Secondary | ICD-10-CM

## 2018-10-14 DIAGNOSIS — M12812 Other specific arthropathies, not elsewhere classified, left shoulder: Secondary | ICD-10-CM | POA: Insufficient documentation

## 2018-10-14 HISTORY — DX: Carpal tunnel syndrome, bilateral upper limbs: G56.03

## 2018-10-14 NOTE — Telephone Encounter (Signed)
Attempted to contact the patient and left message for patient to call the office regarding NVC test.

## 2018-10-14 NOTE — Procedures (Signed)
HISTORY:  Paul [name] is a 62 year old gentleman with a history of dyslipidemia, the patient has had troubles with leg cramps frequently at night.  He has been noted to have elevated muscle enzyme levels in the 250-500 range.  He is being evaluated for this issue.  He has not noted any muscle weakness.  NERVE CONDUCTION STUDIES:  Nerve conduction studies were performed on both upper extremities.  The distal motor latencies for the median nerves were prolonged bilaterally with normal motor amplitudes for these nerves bilaterally.  Slowing was seen for the right median nerve, normal on the left.  The distal motor latency and motor amplitudes for the right ulnar nerve was normal with normal nerve conduction velocities seen for this nerve.  The sensory latencies for the median nerves were prolonged bilaterally, normal for the right ulnar nerve.  The right ulnar F-wave latency was within normal limits.  Nerve conduction studies were performed on the right lower extremity.  The distal motor latencies and motor amplitudes for the peroneal and posterior tibial nerves were within normal limits with normal nerve conduction velocities seen for these nerves.  The right sural and superficial peroneal nerve sensory latencies were normal.  The F-wave latency for the right posterior tibial nerve was slightly prolonged.  EMG STUDIES:  EMG study was performed on the right upper extremity:  The first dorsal interosseous muscle reveals 2 to 4 K units with full recruitment. No fibrillations or positive waves were noted. The abductor pollicis brevis muscle reveals 2 to 5 K units with decreased recruitment. No fibrillations or positive waves were noted. The extensor indicis proprius muscle reveals 1 to 3 K units with full recruitment. No fibrillations or positive waves were noted. The biceps muscle reveals 1 to 2 K units with full recruitment. No fibrillations or positive waves were noted. The triceps muscle  reveals 2 to 4 K units with full recruitment. No fibrillations or positive waves were noted. The anterior deltoid muscle reveals 2 to 3 K units with full recruitment. No fibrillations or positive waves were noted. The cervical paraspinal muscles were tested at 2 levels. No abnormalities of insertional activity were seen at either level tested. There was good relaxation.  EMG study was performed on the right lower extremity:  The tibialis anterior muscle reveals 2 to 4K motor units with full recruitment. No fibrillations or positive waves were seen. The peroneus tertius muscle reveals 2 to 4K motor units with full recruitment. No fibrillations or positive waves were seen. The medial gastrocnemius muscle reveals 1 to 3K motor units with full recruitment. No fibrillations or positive waves were seen. The vastus lateralis muscle reveals 2 to 4K motor units with full recruitment. No fibrillations or positive waves were seen. The iliopsoas muscle reveals 2 to 4K motor units with full recruitment. No fibrillations or positive waves were seen. The biceps femoris muscle (long head) reveals 2 to 4K motor units with full recruitment. No fibrillations or positive waves were seen. The lumbosacral paraspinal muscles were tested at 3 levels, and revealed no abnormalities of insertional activity at all 3 levels tested. There was good relaxation.   IMPRESSION:  Nerve conduction studies done on both upper extremities and the right lower extremity shows evidence of bilateral carpal tunnel syndrome of mild to moderate severity.  No clear evidence of a generalized peripheral neuropathy is seen.  EMG evaluation of the right upper and right lower extremities show no evidence of a myopathic disorder and no evidence of a  right cervical or lumbosacral radiculopathy.  [name] MD 10/14/2018 1:10 PM  [facility] [address] [place], [place] [address]  Phone [phone] Fax  [phone]

## 2018-10-14 NOTE — Progress Notes (Signed)
Please refer to EMG and nerve conduction procedure note.  

## 2018-10-15 NOTE — Progress Notes (Signed)
[facility]    Nerve / Sites Muscle Latency Ref. Amplitude Ref. Rel Amp Segments Distance Velocity Ref. Area    ms ms mV mV %  cm m/s m/s mVms  R Median - APB     Wrist APB 4.5 ?4.4 4.2 ?4.0 100 Wrist - APB 7   13.5     Upper arm APB 10.5  3.3  78.9 Upper arm - Wrist 25 41 ?49 10.9  L Median - APB     Wrist APB 6.0 ?4.4 5.8 ?4.0 100 Wrist - APB 7   18.0     Upper arm APB 11.4  5.0  85.4 Upper arm - Wrist 26 49 ?49 16.1  R Ulnar - ADM     Wrist ADM 2.9 ?3.3 12.3 ?6.0 100 Wrist - ADM 7   42.5     B.Elbow ADM 7.0  11.1  90 B.Elbow - Wrist 22 53 ?49 41.3     A.Elbow ADM 9.1  11.3  103 A.Elbow - B.Elbow 10 49 ?49 41.3         A.Elbow - Wrist      R Peroneal - EDB     Ankle EDB 5.1 ?6.5 7.3 ?2.0 100 Ankle - EDB 9   19.5     Fib head EDB 13.4  7.0  95.9 Fib head - Ankle 37 44 ?44 20.4     Pop fossa EDB 15.7  6.8  96.9 Pop fossa - Fib head 10 44 ?44 20.3         Pop fossa - Ankle      R Tibial - AH     Ankle AH 5.5 ?5.8 5.0 ?4.0 100 Ankle - AH 9   13.0     Pop fossa AH 16.3  3.7  72.9 Pop fossa - Ankle 44 41 ?41 10.9               SNC    Nerve / Sites Rec. Site Peak Lat Ref.  Amp Ref. Segments Distance    ms ms V V  cm  R Sural - Ankle (Calf)     Calf Ankle 4.4 ?4.4 12 ?6 Calf - Ankle 14  R Superficial peroneal - Ankle     Lat leg Ankle 4.1 ?4.4 10 ?6 Lat leg - Ankle 14  R Median - Orthodromic (Dig II, Mid palm)     Dig II Wrist 3.8 ?3.4 5 ?10 Dig II - Wrist 13  L Median - Orthodromic (Dig II, Mid palm)     Dig II Wrist 4.8 ?3.4 4 ?10 Dig II - Wrist 13  R Ulnar - Orthodromic, (Dig V, Mid palm)     Dig V Wrist 2.9 ?3.1 6 ?5 Dig V - Wrist 3211               F  Wave    Nerve F Lat Ref.   ms ms  R Tibial - AH 57.0 ?56.0  R Ulnar - ADM 30.9 ?32.0

## 2018-10-16 ENCOUNTER — Telehealth: Payer: Self-pay

## 2018-10-16 NOTE — Telephone Encounter (Signed)
Advised patient's wife (who is on DPR) of results. She verbalized understanding.

## 2018-10-16 NOTE — Telephone Encounter (Signed)
-----   Message from Pollyann SavoyShaili Deveshwar, MD sent at 10/15/2018  4:48 PM EST ----- EMG and nerve conduction velocities were consistent with bilateral mild to moderate carpal tunnel syndrome.  Lower extremity studies were negative.  Please notify patient. SD ----- Message ----- From: York SpanielWillis, Charles K, MD Sent: 10/14/2018   1:15 PM EST To: Pollyann SavoyShaili Deveshwar, MD

## 2018-10-21 DIAGNOSIS — L989 Disorder of the skin and subcutaneous tissue, unspecified: Secondary | ICD-10-CM | POA: Diagnosis not present
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
## 2018-11-07 ENCOUNTER — Encounter: Payer: Self-pay | Admitting: Cardiovascular Disease

## 2018-11-07 ENCOUNTER — Ambulatory Visit (INDEPENDENT_AMBULATORY_CARE_PROVIDER_SITE_OTHER): Payer: BLUE CROSS/BLUE SHIELD | Admitting: Cardiovascular Disease

## 2018-11-07 VITALS — BP 118/68 | HR 62 | Ht 72.0 in | Wt 183.2 lb

## 2018-11-07 DIAGNOSIS — R748 Abnormal levels of other serum enzymes: Secondary | ICD-10-CM | POA: Diagnosis not present

## 2018-11-07 DIAGNOSIS — E785 Hyperlipidemia, unspecified: Secondary | ICD-10-CM

## 2018-11-07 NOTE — Patient Instructions (Addendum)
Medication Instructions:   If you need a refill on your cardiac medications before your next appointment, please call your pharmacy.   Lab work: Your physician recommends that you return for lab work next Wednesday for fasting lipid and liver panel and CKMB.  If you have labs (blood work) drawn today and your tests are completely normal, you will receive your results only by: Marland Kitchen. MyChart Message (if you have MyChart) OR . A paper copy in the mail If you have any lab test that is abnormal or we need to change your treatment, we will call you to review the results.  Testing/Procedures: NONE ordered today.  Follow-Up: At [facility], you and your health needs are our priority.  As part of our continuing mission to provide you with exceptional heart care, we have created designated Provider Care Teams.  These Care Teams include your primary Cardiologist (physician) and Advanced Practice Providers (APPs -  Physician Assistants and Nurse Practitioners) who all work together to provide you with the care you need, when you need it. You will need a follow up appointment in 12 months.  Please call our office 2 months in advance to schedule this appointment.  You may see Charlton HawsPeter Nishan, MD or one of the following Advanced Practice Providers on your designated Care Team:   Norma FredricksonLori Gerhardt, NP Nada BoozerLaura Ingold, NP . Georgie ChardJill McDaniel, NP

## 2018-11-12 ENCOUNTER — Other Ambulatory Visit: Payer: BLUE CROSS/BLUE SHIELD | Admitting: *Deleted

## 2018-11-12 DIAGNOSIS — E785 Hyperlipidemia, unspecified: Secondary | ICD-10-CM | POA: Diagnosis not present

## 2018-11-12 DIAGNOSIS — R748 Abnormal levels of other serum enzymes: Secondary | ICD-10-CM | POA: Diagnosis not present

## 2018-11-12 LAB — LIPID PANEL
Chol/HDL Ratio: 2.1 ratio (ref 0.0–5.0)
Cholesterol, Total: 155 mg/dL (ref 100–199)
HDL: 74 mg/dL (ref >39)
LDL Calculated: 73 mg/dL (ref 0–99)
TRIGLYCERIDES: 39 mg/dL (ref 0–149)
VLDL Cholesterol Cal: 8 mg/dL (ref 5–40)

## 2018-11-12 LAB — HEPATIC FUNCTION PANEL
ALT: 37 IU/L (ref 0–44)
AST: 38 IU/L (ref 0–40)
Albumin: 4.3 g/dL (ref 3.6–4.8)
Alkaline Phosphatase: 68 IU/L (ref 39–117)
Bilirubin Total: 0.9 mg/dL (ref 0.0–1.2)
Bilirubin, Direct: 0.24 mg/dL (ref 0.00–0.40)
Total Protein: 6.7 g/dL (ref 6.0–8.5)

## 2018-11-12 LAB — CK TOTAL AND CKMB (NOT AT ARMC)
CK-MB Index: 7.9 ng/mL (ref 0.0–10.4)
Total CK: 419 U/L — ABNORMAL HIGH (ref 24–204)

## 2018-11-24 DIAGNOSIS — R7303 Prediabetes: Secondary | ICD-10-CM | POA: Diagnosis not present

## 2018-11-24 DIAGNOSIS — Z8249 Family history of ischemic heart disease and other diseases of the circulatory system: Secondary | ICD-10-CM | POA: Diagnosis not present

## 2018-11-24 DIAGNOSIS — Z8349 Family history of other endocrine, nutritional and metabolic diseases: Secondary | ICD-10-CM | POA: Diagnosis not present

## 2018-11-24 DIAGNOSIS — E039 Hypothyroidism, unspecified: Secondary | ICD-10-CM | POA: Diagnosis not present
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
## 2018-12-04 DIAGNOSIS — L814 Other melanin hyperpigmentation: Secondary | ICD-10-CM | POA: Diagnosis not present

## 2018-12-04 DIAGNOSIS — L821 Other seborrheic keratosis: Secondary | ICD-10-CM | POA: Diagnosis not present

## 2018-12-04 DIAGNOSIS — D225 Melanocytic nevi of trunk: Secondary | ICD-10-CM | POA: Diagnosis not present

## 2018-12-04 DIAGNOSIS — D1801 Hemangioma of skin and subcutaneous tissue: Secondary | ICD-10-CM | POA: Diagnosis not present

## 2018-12-11 ENCOUNTER — Ambulatory Visit: Payer: BLUE CROSS/BLUE SHIELD | Admitting: Physician Assistant
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
## 2018-12-17 ENCOUNTER — Ambulatory Visit (INDEPENDENT_AMBULATORY_CARE_PROVIDER_SITE_OTHER): Payer: BLUE CROSS/BLUE SHIELD | Admitting: Rheumatology

## 2018-12-17 ENCOUNTER — Encounter: Payer: Self-pay | Admitting: Physician Assistant

## 2018-12-17 VITALS — BP 110/67 | HR 64 | Resp 14 | Ht 72.0 in | Wt 179.6 lb

## 2018-12-17 DIAGNOSIS — M5136 Other intervertebral disc degeneration, lumbar region: Secondary | ICD-10-CM

## 2018-12-17 DIAGNOSIS — Z8659 Personal history of other mental and behavioral disorders: Secondary | ICD-10-CM

## 2018-12-17 DIAGNOSIS — F419 Anxiety disorder, unspecified: Secondary | ICD-10-CM

## 2018-12-17 DIAGNOSIS — Z87438 Personal history of other diseases of male genital organs: Secondary | ICD-10-CM

## 2018-12-17 DIAGNOSIS — Z8639 Personal history of other endocrine, nutritional and metabolic disease: Secondary | ICD-10-CM

## 2018-12-17 DIAGNOSIS — F32A Depression, unspecified: Secondary | ICD-10-CM

## 2018-12-17 DIAGNOSIS — M19042 Primary osteoarthritis, left hand: Secondary | ICD-10-CM

## 2018-12-17 DIAGNOSIS — M19071 Primary osteoarthritis, right ankle and foot: Secondary | ICD-10-CM

## 2018-12-17 DIAGNOSIS — M19072 Primary osteoarthritis, left ankle and foot: Secondary | ICD-10-CM

## 2018-12-17 DIAGNOSIS — F329 Major depressive disorder, single episode, unspecified: Secondary | ICD-10-CM

## 2018-12-17 DIAGNOSIS — R748 Abnormal levels of other serum enzymes: Secondary | ICD-10-CM

## 2018-12-17 DIAGNOSIS — M19041 Primary osteoarthritis, right hand: Secondary | ICD-10-CM

## 2018-12-17 DIAGNOSIS — Z8679 Personal history of other diseases of the circulatory system: Secondary | ICD-10-CM

## 2018-12-17 MED ORDER — DICLOFENAC SODIUM 1 % TD GEL: TRANSDERMAL | 3 refills | Status: AC

## 2018-12-22 ENCOUNTER — Telehealth: Payer: Self-pay | Admitting: *Deleted

## 2018-12-22 NOTE — Telephone Encounter (Signed)
Prior Authorization submitted via cover my meds for Voltaren Gel. Prior Authorization has been approved. Effective from 12/22/2018 through 12/20/2021

## 2018-12-28 DIAGNOSIS — H0289 Other specified disorders of eyelid: Secondary | ICD-10-CM | POA: Diagnosis not present

## 2018-12-28 DIAGNOSIS — H5711 Ocular pain, right eye: Secondary | ICD-10-CM | POA: Diagnosis not present

## 2019-01-01 DIAGNOSIS — F908 Attention-deficit hyperactivity disorder, other type: Secondary | ICD-10-CM | POA: Diagnosis not present

## 2019-01-14 DIAGNOSIS — F419 Anxiety disorder, unspecified: Secondary | ICD-10-CM | POA: Diagnosis not present

## 2019-01-14 DIAGNOSIS — F900 Attention-deficit hyperactivity disorder, predominantly inattentive type: Secondary | ICD-10-CM | POA: Diagnosis not present

## 2019-01-14 DIAGNOSIS — F902 Attention-deficit hyperactivity disorder, combined type: Secondary | ICD-10-CM | POA: Diagnosis not present

## 2019-01-14 DIAGNOSIS — Z79899 Other long term (current) drug therapy: Secondary | ICD-10-CM | POA: Diagnosis not present

## 2019-01-14 DIAGNOSIS — R41840 Attention and concentration deficit: Secondary | ICD-10-CM | POA: Diagnosis not present

## 2019-01-20 DIAGNOSIS — E039 Hypothyroidism, unspecified: Secondary | ICD-10-CM | POA: Diagnosis not present

## 2019-01-20 DIAGNOSIS — R7309 Other abnormal glucose: Secondary | ICD-10-CM | POA: Diagnosis not present

## 2019-01-27 DIAGNOSIS — F908 Attention-deficit hyperactivity disorder, other type: Secondary | ICD-10-CM | POA: Diagnosis not present

## 2019-01-27 DIAGNOSIS — F411 Generalized anxiety disorder: Secondary | ICD-10-CM | POA: Diagnosis not present

## 2019-01-27 DIAGNOSIS — F900 Attention-deficit hyperactivity disorder, predominantly inattentive type: Secondary | ICD-10-CM | POA: Diagnosis not present

## 2019-02-12 DIAGNOSIS — M25512 Pain in left shoulder: Secondary | ICD-10-CM | POA: Insufficient documentation

## 2019-02-18 DIAGNOSIS — M25512 Pain in left shoulder: Secondary | ICD-10-CM | POA: Diagnosis not present

## 2019-02-25 DIAGNOSIS — F411 Generalized anxiety disorder: Secondary | ICD-10-CM | POA: Diagnosis not present

## 2019-02-25 DIAGNOSIS — F908 Attention-deficit hyperactivity disorder, other type: Secondary | ICD-10-CM | POA: Diagnosis not present

## 2019-02-26 DIAGNOSIS — F900 Attention-deficit hyperactivity disorder, predominantly inattentive type: Secondary | ICD-10-CM | POA: Diagnosis not present

## 2019-02-26 DIAGNOSIS — Z79899 Other long term (current) drug therapy: Secondary | ICD-10-CM | POA: Diagnosis not present

## 2019-03-04 DIAGNOSIS — M25512 Pain in left shoulder: Secondary | ICD-10-CM | POA: Diagnosis not present

## 2019-03-10 DIAGNOSIS — M25512 Pain in left shoulder: Secondary | ICD-10-CM | POA: Diagnosis not present

## 2019-03-10 DIAGNOSIS — M6281 Muscle weakness (generalized): Secondary | ICD-10-CM | POA: Diagnosis not present

## 2019-03-10 DIAGNOSIS — S46012D Strain of muscle(s) and tendon(s) of the rotator cuff of left shoulder, subsequent encounter: Secondary | ICD-10-CM | POA: Diagnosis not present

## 2019-04-01 DIAGNOSIS — M19012 Primary osteoarthritis, left shoulder: Secondary | ICD-10-CM | POA: Diagnosis not present

## 2019-04-02 DIAGNOSIS — F411 Generalized anxiety disorder: Secondary | ICD-10-CM | POA: Diagnosis not present

## 2019-04-02 DIAGNOSIS — F908 Attention-deficit hyperactivity disorder, other type: Secondary | ICD-10-CM | POA: Diagnosis not present

## 2019-04-08 DIAGNOSIS — F419 Anxiety disorder, unspecified: Secondary | ICD-10-CM | POA: Diagnosis not present

## 2019-04-08 DIAGNOSIS — F900 Attention-deficit hyperactivity disorder, predominantly inattentive type: Secondary | ICD-10-CM | POA: Diagnosis not present

## 2019-04-08 DIAGNOSIS — Z79899 Other long term (current) drug therapy: Secondary | ICD-10-CM | POA: Diagnosis not present

## 2019-05-12 ENCOUNTER — Other Ambulatory Visit: Payer: Self-pay | Admitting: Cardiovascular Disease

## 2019-05-12 MED ORDER — ROSUVASTATIN CALCIUM 5 MG PO TABS: 5.0000 mg | ORAL_TABLET | ORAL | 1 refills | Status: DC

## 2019-05-13 DIAGNOSIS — M25512 Pain in left shoulder: Secondary | ICD-10-CM | POA: Diagnosis not present

## 2019-05-18 DIAGNOSIS — M12812 Other specific arthropathies, not elsewhere classified, left shoulder: Secondary | ICD-10-CM | POA: Diagnosis not present

## 2019-05-18 DIAGNOSIS — M75122 Complete rotator cuff tear or rupture of left shoulder, not specified as traumatic: Secondary | ICD-10-CM | POA: Insufficient documentation

## 2019-05-18 DIAGNOSIS — M25512 Pain in left shoulder: Secondary | ICD-10-CM | POA: Diagnosis not present

## 2019-05-20 DIAGNOSIS — M25512 Pain in left shoulder: Secondary | ICD-10-CM | POA: Diagnosis not present

## 2019-05-28 DIAGNOSIS — M25512 Pain in left shoulder: Secondary | ICD-10-CM | POA: Diagnosis not present
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
## 2019-06-11 DIAGNOSIS — M25512 Pain in left shoulder: Secondary | ICD-10-CM | POA: Diagnosis not present

## 2019-06-12 DIAGNOSIS — R7301 Impaired fasting glucose: Secondary | ICD-10-CM | POA: Diagnosis not present

## 2019-06-12 DIAGNOSIS — E785 Hyperlipidemia, unspecified: Secondary | ICD-10-CM | POA: Diagnosis not present

## 2019-06-12 DIAGNOSIS — N400 Enlarged prostate without lower urinary tract symptoms: Secondary | ICD-10-CM | POA: Diagnosis not present

## 2019-06-12 DIAGNOSIS — Z0000 Encounter for general adult medical examination without abnormal findings: Secondary | ICD-10-CM | POA: Diagnosis not present

## 2019-06-12 DIAGNOSIS — D649 Anemia, unspecified: Secondary | ICD-10-CM | POA: Diagnosis not present

## 2019-06-17 ENCOUNTER — Other Ambulatory Visit: Payer: Self-pay

## 2019-06-17 ENCOUNTER — Ambulatory Visit: Payer: Self-pay

## 2019-06-17 ENCOUNTER — Encounter: Payer: Self-pay | Admitting: Physician Assistant

## 2019-06-17 ENCOUNTER — Ambulatory Visit (INDEPENDENT_AMBULATORY_CARE_PROVIDER_SITE_OTHER): Payer: BC Managed Care – PPO | Admitting: Physician Assistant

## 2019-06-17 VITALS — BP 107/64 | HR 58 | Resp 14 | Ht 72.0 in | Wt 185.2 lb

## 2019-06-17 DIAGNOSIS — M5136 Other intervertebral disc degeneration, lumbar region: Secondary | ICD-10-CM

## 2019-06-17 DIAGNOSIS — Z8679 Personal history of other diseases of the circulatory system: Secondary | ICD-10-CM

## 2019-06-17 DIAGNOSIS — Z8639 Personal history of other endocrine, nutritional and metabolic disease: Secondary | ICD-10-CM

## 2019-06-17 DIAGNOSIS — M19041 Primary osteoarthritis, right hand: Secondary | ICD-10-CM

## 2019-06-17 DIAGNOSIS — Z87438 Personal history of other diseases of male genital organs: Secondary | ICD-10-CM

## 2019-06-17 DIAGNOSIS — F329 Major depressive disorder, single episode, unspecified: Secondary | ICD-10-CM

## 2019-06-17 DIAGNOSIS — M19042 Primary osteoarthritis, left hand: Secondary | ICD-10-CM

## 2019-06-17 DIAGNOSIS — R748 Abnormal levels of other serum enzymes: Secondary | ICD-10-CM

## 2019-06-17 DIAGNOSIS — M19072 Primary osteoarthritis, left ankle and foot: Secondary | ICD-10-CM

## 2019-06-17 DIAGNOSIS — M79671 Pain in right foot: Secondary | ICD-10-CM

## 2019-06-17 DIAGNOSIS — M19071 Primary osteoarthritis, right ankle and foot: Secondary | ICD-10-CM

## 2019-06-17 DIAGNOSIS — F32A Depression, unspecified: Secondary | ICD-10-CM

## 2019-06-17 DIAGNOSIS — Z8659 Personal history of other mental and behavioral disorders: Secondary | ICD-10-CM

## 2019-06-17 DIAGNOSIS — F419 Anxiety disorder, unspecified: Secondary | ICD-10-CM

## 2019-06-23 ENCOUNTER — Telehealth: Payer: Self-pay | Admitting: Rheumatology

## 2019-06-23 NOTE — Telephone Encounter (Signed)
Patient's wife Denny Peon called checking on the status of Paul [name]'s referral for an MRI.   Denny Peon requested a return call [phone]

## 2019-06-24 ENCOUNTER — Telehealth: Payer: Self-pay | Admitting: Rheumatology

## 2019-06-24 NOTE — Telephone Encounter (Signed)
Patient's wife calling to ask if MRI doctor wants patient to have could be ordered at [facility], [place]. Per wife, they will have to pay triple the amount at [facility] for MRI. Wife request order to be faxed to [facility] at [phone]. Wife also request a call back to confirm order can be sent there.

## 2019-06-24 NOTE — Telephone Encounter (Signed)
Does this require an authorization? Please advise. Thank you.

## 2019-06-24 NOTE — Telephone Encounter (Signed)
Already authorized, just need to change the facility to [facility]

## 2019-06-24 NOTE — Telephone Encounter (Signed)
Already authorized, just need to [facility] the [facility] 

## 2019-06-25 NOTE — Telephone Encounter (Signed)
Thank you so much

## 2019-06-29 DIAGNOSIS — R7303 Prediabetes: Secondary | ICD-10-CM | POA: Diagnosis not present

## 2019-06-29 DIAGNOSIS — E039 Hypothyroidism, unspecified: Secondary | ICD-10-CM | POA: Diagnosis not present

## 2019-06-29 DIAGNOSIS — Z8349 Family history of other endocrine, nutritional and metabolic diseases: Secondary | ICD-10-CM | POA: Diagnosis not present

## 2019-06-29 DIAGNOSIS — Z8249 Family history of ischemic heart disease and other diseases of the circulatory system: Secondary | ICD-10-CM | POA: Diagnosis not present

## 2019-07-02 DIAGNOSIS — Z79899 Other long term (current) drug therapy: Secondary | ICD-10-CM | POA: Diagnosis not present

## 2019-07-02 DIAGNOSIS — F419 Anxiety disorder, unspecified: Secondary | ICD-10-CM | POA: Diagnosis not present

## 2019-07-02 DIAGNOSIS — F900 Attention-deficit hyperactivity disorder, predominantly inattentive type: Secondary | ICD-10-CM | POA: Diagnosis not present

## 2019-07-06 ENCOUNTER — Encounter: Payer: Self-pay | Admitting: Rheumatology

## 2019-07-06 DIAGNOSIS — M79671 Pain in right foot: Secondary | ICD-10-CM | POA: Diagnosis not present

## 2019-07-07 ENCOUNTER — Other Ambulatory Visit: Payer: Self-pay | Admitting: Physician Assistant
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
## 2019-07-14 ENCOUNTER — Telehealth: Payer: Self-pay | Admitting: Pharmacist

## 2019-07-14 ENCOUNTER — Ambulatory Visit (INDEPENDENT_AMBULATORY_CARE_PROVIDER_SITE_OTHER): Payer: BC Managed Care – PPO | Admitting: Rheumatology

## 2019-07-14 ENCOUNTER — Other Ambulatory Visit: Payer: Self-pay

## 2019-07-14 ENCOUNTER — Encounter: Payer: Self-pay | Admitting: Rheumatology

## 2019-07-14 VITALS — BP 109/64 | HR 63 | Resp 14 | Ht 72.0 in | Wt 185.8 lb

## 2019-07-14 DIAGNOSIS — F329 Major depressive disorder, single episode, unspecified: Secondary | ICD-10-CM

## 2019-07-14 DIAGNOSIS — Z79899 Other long term (current) drug therapy: Secondary | ICD-10-CM

## 2019-07-14 DIAGNOSIS — M19041 Primary osteoarthritis, right hand: Secondary | ICD-10-CM | POA: Diagnosis not present

## 2019-07-14 DIAGNOSIS — Z8639 Personal history of other endocrine, nutritional and metabolic disease: Secondary | ICD-10-CM

## 2019-07-14 DIAGNOSIS — M1990 Unspecified osteoarthritis, unspecified site: Secondary | ICD-10-CM

## 2019-07-14 DIAGNOSIS — M79671 Pain in right foot: Secondary | ICD-10-CM

## 2019-07-14 DIAGNOSIS — M51369 Other intervertebral disc degeneration, lumbar region without mention of lumbar back pain or lower extremity pain: Secondary | ICD-10-CM

## 2019-07-14 DIAGNOSIS — M5136 Other intervertebral disc degeneration, lumbar region: Secondary | ICD-10-CM

## 2019-07-14 DIAGNOSIS — Z8679 Personal history of other diseases of the circulatory system: Secondary | ICD-10-CM

## 2019-07-14 DIAGNOSIS — M1380 Other specified arthritis, unspecified site: Secondary | ICD-10-CM

## 2019-07-14 DIAGNOSIS — M19071 Primary osteoarthritis, right ankle and foot: Secondary | ICD-10-CM

## 2019-07-14 DIAGNOSIS — Z87438 Personal history of other diseases of male genital organs: Secondary | ICD-10-CM

## 2019-07-14 DIAGNOSIS — M19042 Primary osteoarthritis, left hand: Secondary | ICD-10-CM

## 2019-07-14 DIAGNOSIS — M19072 Primary osteoarthritis, left ankle and foot: Secondary | ICD-10-CM

## 2019-07-14 DIAGNOSIS — R748 Abnormal levels of other serum enzymes: Secondary | ICD-10-CM

## 2019-07-14 DIAGNOSIS — F32A Depression, unspecified: Secondary | ICD-10-CM

## 2019-07-14 DIAGNOSIS — Z8659 Personal history of other mental and behavioral disorders: Secondary | ICD-10-CM

## 2019-07-14 DIAGNOSIS — F419 Anxiety disorder, unspecified: Secondary | ICD-10-CM

## 2019-07-14 MED ORDER — PREDNISONE 5 MG PO TABS: ORAL_TABLET | ORAL | 0 refills | Status: AC

## 2019-07-14 NOTE — Progress Notes (Signed)
Pharmacy Note  Subjective: Patient [facility] to see Dr. Corliss Skainseveshwar.  Patient seen by the pharmacist for counseling on methotrexate for inflammatory arthritis. He is naive to therapy.  Objective: CBC    Component Value Date/Time   WBC 6.5 05/19/2018 1924   RBC 4.30 05/19/2018 1924   HGB 13.0 05/19/2018 1924   HCT 39.3 05/19/2018 1924   PLT 309 05/19/2018 1924   MCV 91.4 05/19/2018 1924   MCH 30.2 05/19/2018 1924   MCHC 33.1 05/19/2018 1924   RDW 12.5 05/19/2018 1924    CMP     Component Value Date/Time   NA 137 05/19/2018 1924   K 4.3 05/19/2018 1924   CL 103 05/19/2018 1924   CO2 27 05/19/2018 1924   GLUCOSE 97 05/19/2018 1924   BUN 17 05/19/2018 1924   CREATININE 1.20 05/19/2018 1924   CALCIUM 9.1 05/19/2018 1924   PROT 6.7 11/12/2018 0826   ALBUMIN 4.3 11/12/2018 0826   AST 38 11/12/2018 0826   ALT 37 11/12/2018 0826   ALKPHOS 68 11/12/2018 0826   BILITOT 0.9 11/12/2018 0826   GFRNONAA >60 05/19/2018 1924   GFRAA >60 05/19/2018 1924    Baseline Immunosuppressant Therapy Labs TB GOLD: pending 07/14/2019  Hepatitis Panel Hepatitis Latest Ref Rng & Units 09/02/2018  Hep B Surface Ag NON-REACTI NON-REACTIVE  Hep B IgM NON-REACTI NON-REACTIVE  Hep C Ab NON-REACTI NON-REACTIVE  Hep C Ab NON-REACTI NON-REACTIVE   HIV Lab Results  Component Value Date   HIV Non Reactive 10/19/2016   Immunoglobulins Immunoglobulin Electrophoresis Latest Ref Rng & Units 09/02/2018  IgA  47 - 310 mg/dL 914210  IgG 782600 - 9,5621,640 mg/dL 1,3081,153  IgM 50 - 657300 mg/dL 84(O44(L)   SPEP Serum Protein Electrophoresis Latest Ref Rng & Units 11/12/2018  Total Protein 6.0 - 8.5 g/dL 6.7  Albumin 3.8 - 4.8 g/dL -  Alpha-1 0.2 - 0.3 g/dL -  Alpha-2 0.5 - 0.9 g/dL -  Beta Globulin 0.4 - 0.6 g/dL -  Beta 2 0.2 - 0.5 g/dL -  Gamma Globulin 0.8 - 1.7 g/dL -   [id number] No results found for: G6PDH TPMT Lab Results  Component Value Date   TPMT 13 09/02/2018      Chest-xray:  No acute disease 09/02/2018  Alcohol use: He drinks 1 alcoholic beverage daily.  He was advised to abstain if able or at least limit to 2 drinks a week per ACR recommendation.  Assessment/Plan:   Patient was counseled on the purpose, proper use, and adverse effects of methotrexate including nausea, infection, and signs and symptoms of pneumonitis. Discussed that there is the possibility of an increased risk of malignancy, specifically lymphomas, but it is not well understood if this increased risk is due to the medication or the disease state.  Instructed patient that medication should be held for infection and prior to surgery.  Advised patient to avoid live vaccines. Recommend annual influenza, Pneumovax 23, Prevnar 13, and Shingrix as indicated.   Reviewed instructions with patient to take methotrexate weekly along with folic acid daily.  Discussed the importance of frequent monitoring of kidney and liver function and blood counts, and provided patient with standing lab instructions.  Counseled patient to avoid NSAIDs and alcohol while on methotrexate.  Provided patient with educational materials on methotrexate and answered all questions.   Patient voiced understanding.  Patient consented to methotrexate use.  Will upload into chart.    Dose of methotrexate will be methotrexate 2.5  mg 6 tablets weekly for 2 weeks then increase to 8 tablets weekly if tolerated along with folic acid 1 mg 2 tablets daily. Prescription pending TB gold/CMP/CBC.  All questions encouraged and answered.  Instructed patient to call with any further questions or concerns.  Mariella Saa, PharmD, [place], [place] Clinical Specialty Pharmacist [phone]  07/14/2019 4:37 PM

## 2019-07-14 NOTE — Patient Instructions (Addendum)
Stop taking over the counter pain relievers like ibuprofen, Advil, and Aleve.  Start taking prednisone taper.  Prescriptions for methotrexate will be called in once lab results come back.  Standing Labs We placed an order today for your standing lab work.    Please come back and get your standing labs in 2 weeks after starting methotrexate, 4 weeks, 8 weeks, then every 3 months.  We have open lab daily Monday through Thursday from 8:30-12:30 PM and 1:30-4:30 PM and Friday from 8:30-12:30 PM and 1:30 -4:00 PM at the office of Dr. Bo Merino.   You may experience shorter wait times on Monday and Friday afternoons. The office is located at [address], [place], [place], [place] [address] No appointment is necessary.   Labs are drawn by [facility].  You may receive a bill from [place] for your lab work.  If you wish to have your labs drawn at another location, please call the office 24 hours in advance to send orders.  If you have any questions regarding directions or hours of operation,  please call [phone].   Just as a reminder please drink plenty of water prior to coming for your lab work. Thanks!  Vaccines You are taking a medication(s) that can suppress your immune system.  The following immunizations are recommended: . Flu annually . Pneumonia (Pneumovax 23 and Prevnar 13 spaced at least 1 year apart) . Shingrix  Please check with your PCP to make sure you are up to date.  Methotrexate tablets What is this medicine? METHOTREXATE (METH oh TREX ate) is a chemotherapy drug used to treat cancer including breast cancer, leukemia, and lymphoma. This medicine can also be used to treat psoriasis and certain kinds of arthritis. This medicine may be used for other purposes; ask your health care provider or pharmacist if you have questions. COMMON BRAND NAME(S): Rheumatrex, Trexall What should I tell my health care provider before I take this medicine? They need to know if  you have any of these conditions:  fluid in the stomach area or lungs  if you often drink alcohol  infection or immune system problems  kidney disease or on hemodialysis  liver disease  low blood counts, like low white cell, platelet, or red cell counts  lung disease  radiation therapy  stomach ulcers  ulcerative colitis  an unusual or allergic reaction to methotrexate, other medicines, foods, dyes, or preservatives  pregnant or trying to get pregnant  breast-feeding How should I use this medicine? Take this medicine by mouth with a glass of water. Follow the directions on the prescription label. Take your medicine at regular intervals. Do not take it more often than directed. Do not stop taking except on your doctor's advice. Make sure you know why you are taking this medicine and how often you should take it. If this medicine is used for a condition that is not cancer, like arthritis or psoriasis, it should be taken weekly, NOT daily. Taking this medicine more often than directed can cause serious side effects, even death. Talk to your healthcare provider about safe handling and disposal of this medicine. You may need to take special precautions. Talk to your pediatrician regarding the use of this medicine in children. While this drug may be prescribed for selected conditions, precautions do apply. Overdosage: If you think you have taken too much of this medicine contact a poison control center or emergency room at once. NOTE: This medicine is only for you. Do not share  this medicine with others. What if I miss a dose? If you miss a dose, talk with your doctor or health care professional. Do not take double or extra doses. What may interact with this medicine? This medicine may interact with the following medication:  acitretin  aspirin and aspirin-like medicines including salicylates  azathioprine  certain antibiotics like penicillins, tetracycline, and  chloramphenicol  cyclosporine  gold  hydroxychloroquine  live virus vaccines  NSAIDs, medicines for pain and inflammation, like ibuprofen or naproxen  other cytotoxic agents  penicillamine  phenylbutazone  phenytoin  probenecid  retinoids such as isotretinoin and tretinoin  steroid medicines like prednisone or cortisone  sulfonamides like sulfasalazine and trimethoprim/sulfamethoxazole  theophylline This list may not describe all possible interactions. Give your health care provider a list of all the medicines, herbs, non-prescription drugs, or dietary supplements you use. Also tell them if you smoke, drink alcohol, or use illegal drugs. Some items may interact with your medicine. What should I watch for while using this medicine? Avoid alcoholic drinks. This medicine can make you more sensitive to the sun. Keep out of the sun. If you cannot avoid being in the sun, wear protective clothing and use sunscreen. Do not use sun lamps or tanning beds/booths. You may need blood work done while you are taking this medicine. Call your doctor or health care professional for advice if you get a fever, chills or sore throat, or other symptoms of a cold or flu. Do not treat yourself. This drug decreases your body's ability to fight infections. Try to avoid being around people who are sick. This medicine may increase your risk to bruise or bleed. Call your doctor or health care professional if you notice any unusual bleeding. Check with your doctor or health care professional if you get an attack of severe diarrhea, nausea and vomiting, or if you sweat a lot. The loss of too much body fluid can make it dangerous for you to take this medicine. Talk to your doctor about your risk of cancer. You may be more at risk for certain types of cancers if you take this medicine. Both men and women must use effective birth control with this medicine. Do not become pregnant while taking this medicine or  until at least 1 normal menstrual cycle has occurred after stopping it. Women should inform their doctor if they wish to become pregnant or think they might be pregnant. Men should not father a child while taking this medicine and for 3 months after stopping it. There is a potential for serious side effects to an unborn child. Talk to your health care professional or pharmacist for more information. Do not breast-feed an infant while taking this medicine. What side effects may I notice from receiving this medicine? Side effects that you should report to your doctor or health care professional as soon as possible:  allergic reactions like skin rash, itching or hives, swelling of the face, lips, or tongue  breathing problems or shortness of breath  diarrhea  dry, nonproductive cough  low blood counts - this medicine may decrease the number of white blood cells, red blood cells and platelets. You may be at increased risk for infections and bleeding.  mouth sores  redness, blistering, peeling or loosening of the skin, including inside the mouth  signs of infection - fever or chills, cough, sore throat, pain or trouble passing urine  signs and symptoms of bleeding such as bloody or black, tarry stools; red or dark-brown urine; spitting  up blood or brown material that looks like coffee grounds; red spots on the skin; unusual bruising or bleeding from the eye, gums, or nose  signs and symptoms of kidney injury like trouble passing urine or change in the amount of urine  signs and symptoms of liver injury like dark yellow or brown urine; general ill feeling or flu-like symptoms; light-colored stools; loss of appetite; nausea; right upper belly pain; unusually weak or tired; yellowing of the eyes or skin Side effects that usually do not require medical attention (report to your doctor or health care professional if they continue or are bothersome):  dizziness  hair loss  tiredness  upset  stomach  vomiting This list may not describe all possible side effects. Call your doctor for medical advice about side effects. You may report side effects to FDA at 1-800-FDA-1088. Where should I keep my medicine? Keep out of the reach of children. Store at room temperature between 20 and 25 degrees C (68 and 77 degrees F). Protect from light. Throw away any unused medicine after the expiration date. NOTE: This sheet is a summary. It may not cover all possible information. If you have questions about this medicine, talk to your doctor, pharmacist, or health care provider.  2020 Elsevier/Gold Standard (2017-07-11 13:38:43)

## 2019-07-14 NOTE — Telephone Encounter (Signed)
Dose of methotrexate will be methotrexate 2.5 mg 6 tablets weekly for 2 weeks then increase to 8 tablets weekly if tolerated along with folic acid 1 mg 2 tablets daily. Prescription pending TB gold/CMP/CBC.

## 2019-07-17 LAB — COMPLETE METABOLIC PANEL WITH GFR
AG Ratio: 1.8 (calc) (ref 1.0–2.5)
ALT: 28 U/L (ref 9–46)
AST: 27 U/L (ref 10–35)
Albumin: 4.2 g/dL (ref 3.6–5.1)
Alkaline phosphatase (APISO): 65 U/L (ref 35–144)
BUN: 25 mg/dL (ref 7–25)
CO2: 28 mmol/L (ref 20–32)
Calcium: 9.9 mg/dL (ref 8.6–10.3)
Chloride: 103 mmol/L (ref 98–110)
Creat: 1.15 mg/dL (ref 0.70–1.25)
GFR, Est African American: 79 mL/min/{1.73_m2} (ref >=60)
GFR, Est Non African American: 68 mL/min/{1.73_m2} (ref >=60)
Globulin: 2.3 g/dL (calc) (ref 1.9–3.7)
Glucose, Bld: 97 mg/dL (ref 65–99)
Potassium: 4.6 mmol/L (ref 3.5–5.3)
Sodium: 138 mmol/L (ref 135–146)
Total Bilirubin: 0.7 mg/dL (ref 0.2–1.2)
Total Protein: 6.5 g/dL (ref 6.1–8.1)

## 2019-07-17 LAB — CBC WITH DIFFERENTIAL/PLATELET
Absolute Monocytes: 640 cells/uL (ref 200–950)
Basophils Absolute: 53 cells/uL (ref 0–200)
Basophils Relative: 0.8 %
Eosinophils Absolute: 257 cells/uL (ref 15–500)
Eosinophils Relative: 3.9 %
HCT: 36.6 % — ABNORMAL LOW (ref 38.5–50.0)
Hemoglobin: 12.5 g/dL — ABNORMAL LOW (ref 13.2–17.1)
Lymphs Abs: 1247 cells/uL (ref 850–3900)
MCH: 31.7 pg (ref 27.0–33.0)
MCHC: 34.2 g/dL (ref 32.0–36.0)
MCV: 92.9 fL (ref 80.0–100.0)
MPV: 9.9 fL (ref 7.5–12.5)
Monocytes Relative: 9.7 %
Neutro Abs: 4402 cells/uL (ref 1500–7800)
Neutrophils Relative %: 66.7 %
Platelets: 303 10*3/uL (ref 140–400)
RBC: 3.94 10*6/uL — ABNORMAL LOW (ref 4.20–5.80)
RDW: 11.8 % (ref 11.0–15.0)
Total Lymphocyte: 18.9 %
WBC: 6.6 10*3/uL (ref 3.8–10.8)

## 2019-07-17 LAB — QUANTIFERON-TB GOLD PLUS
Mitogen-NIL: >10 IU/mL
NIL: 0.02 IU/mL
QuantiFERON-TB Gold Plus: NEGATIVE
TB1-NIL: 0 IU/mL
TB2-NIL: 0 IU/mL

## 2019-07-21 ENCOUNTER — Telehealth: Payer: Self-pay | Admitting: Rheumatology

## 2019-07-21 NOTE — Telephone Encounter (Signed)
Patient's wife called to request a call back to discuss patient's lab results from labs patient had done this past Tuesday. Please call to advise.

## 2019-07-22 MED ORDER — FOLIC ACID 1 MG PO TABS: 2.0000 mg | ORAL_TABLET | Freq: Every day | ORAL | 3 refills | Status: DC

## 2019-07-22 MED ORDER — METHOTREXATE 2.5 MG PO TABS: ORAL_TABLET | ORAL | 0 refills | Status: DC

## 2019-07-22 NOTE — Telephone Encounter (Signed)
Patient labs resulted. Prescription sent to the pharmacy and lab schedule reviewed

## 2019-07-22 NOTE — Telephone Encounter (Signed)
He will be returning for lab work 2 weeks after starting on MTX, so please make sure he has CK drawn at that time.

## 2019-07-22 NOTE — Telephone Encounter (Signed)
Ok to add CK

## 2019-07-22 NOTE — Telephone Encounter (Signed)
Mild anemia, CMP is normal, CK is pending ? Please check why?

## 2019-07-22 NOTE — Telephone Encounter (Signed)
Spoke with Paul [name] and advised her patient's labs show Mild anemia, CMP is normal. She will advise patient. Unable to add CK as  The lab only keeps blood for 7 days and it has been 8 days since labs were drawn.

## 2019-07-22 NOTE — Telephone Encounter (Signed)
Noted  
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
## 2019-08-04 ENCOUNTER — Other Ambulatory Visit: Payer: Self-pay | Admitting: *Deleted

## 2019-08-04 DIAGNOSIS — Z79899 Other long term (current) drug therapy: Secondary | ICD-10-CM

## 2019-08-04 DIAGNOSIS — R748 Abnormal levels of other serum enzymes: Secondary | ICD-10-CM | POA: Diagnosis not present

## 2019-08-05 LAB — CBC WITH DIFFERENTIAL/PLATELET
Absolute Monocytes: 462 cells/uL (ref 200–950)
Basophils Absolute: 50 cells/uL (ref 0–200)
Basophils Relative: 0.7 %
Eosinophils Absolute: 92 cells/uL (ref 15–500)
Eosinophils Relative: 1.3 %
HCT: 38.4 % — ABNORMAL LOW (ref 38.5–50.0)
Hemoglobin: 13 g/dL — ABNORMAL LOW (ref 13.2–17.1)
Lymphs Abs: 994 cells/uL (ref 850–3900)
MCH: 31.6 pg (ref 27.0–33.0)
MCHC: 33.9 g/dL (ref 32.0–36.0)
MCV: 93.4 fL (ref 80.0–100.0)
MPV: 9.9 fL (ref 7.5–12.5)
Monocytes Relative: 6.5 %
Neutro Abs: 5503 cells/uL (ref 1500–7800)
Neutrophils Relative %: 77.5 %
Platelets: 308 10*3/uL (ref 140–400)
RBC: 4.11 10*6/uL — ABNORMAL LOW (ref 4.20–5.80)
RDW: 11.9 % (ref 11.0–15.0)
Total Lymphocyte: 14 %
WBC: 7.1 10*3/uL (ref 3.8–10.8)

## 2019-08-05 LAB — COMPLETE METABOLIC PANEL WITH GFR
AG Ratio: 1.7 (calc) (ref 1.0–2.5)
ALT: 28 U/L (ref 9–46)
AST: 27 U/L (ref 10–35)
Albumin: 4.2 g/dL (ref 3.6–5.1)
Alkaline phosphatase (APISO): 53 U/L (ref 35–144)
BUN: 23 mg/dL (ref 7–25)
CO2: 29 mmol/L (ref 20–32)
Calcium: 9.9 mg/dL (ref 8.6–10.3)
Chloride: 103 mmol/L (ref 98–110)
Creat: 0.98 mg/dL (ref 0.70–1.25)
GFR, Est African American: 96 mL/min/{1.73_m2} (ref >=60)
GFR, Est Non African American: 83 mL/min/{1.73_m2} (ref >=60)
Globulin: 2.5 g/dL (calc) (ref 1.9–3.7)
Glucose, Bld: 110 mg/dL — ABNORMAL HIGH (ref 65–99)
Potassium: 5.3 mmol/L (ref 3.5–5.3)
Sodium: 140 mmol/L (ref 135–146)
Total Bilirubin: 1.4 mg/dL — ABNORMAL HIGH (ref 0.2–1.2)
Total Protein: 6.7 g/dL (ref 6.1–8.1)

## 2019-08-05 LAB — CK: Total CK: 277 U/L — ABNORMAL HIGH (ref 44–196)

## 2019-08-05 NOTE — Progress Notes (Signed)
CK is better. Rest of the labs are stable.

## 2019-08-11 ENCOUNTER — Ambulatory Visit (INDEPENDENT_AMBULATORY_CARE_PROVIDER_SITE_OTHER): Payer: BC Managed Care – PPO | Admitting: Rheumatology

## 2019-08-11 ENCOUNTER — Encounter: Payer: Self-pay | Admitting: Rheumatology

## 2019-08-11 ENCOUNTER — Other Ambulatory Visit: Payer: Self-pay

## 2019-08-11 VITALS — BP 115/70 | HR 60 | Resp 12 | Ht 72.0 in | Wt 187.2 lb

## 2019-08-11 DIAGNOSIS — M5136 Other intervertebral disc degeneration, lumbar region: Secondary | ICD-10-CM

## 2019-08-11 DIAGNOSIS — M19071 Primary osteoarthritis, right ankle and foot: Secondary | ICD-10-CM | POA: Diagnosis not present

## 2019-08-11 DIAGNOSIS — F329 Major depressive disorder, single episode, unspecified: Secondary | ICD-10-CM

## 2019-08-11 DIAGNOSIS — Z8639 Personal history of other endocrine, nutritional and metabolic disease: Secondary | ICD-10-CM

## 2019-08-11 DIAGNOSIS — R748 Abnormal levels of other serum enzymes: Secondary | ICD-10-CM

## 2019-08-11 DIAGNOSIS — F32A Depression, unspecified: Secondary | ICD-10-CM

## 2019-08-11 DIAGNOSIS — M0600 Rheumatoid arthritis without rheumatoid factor, unspecified site: Secondary | ICD-10-CM

## 2019-08-11 DIAGNOSIS — Z79899 Other long term (current) drug therapy: Secondary | ICD-10-CM | POA: Diagnosis not present

## 2019-08-11 DIAGNOSIS — M19041 Primary osteoarthritis, right hand: Secondary | ICD-10-CM | POA: Diagnosis not present

## 2019-08-11 DIAGNOSIS — Z87438 Personal history of other diseases of male genital organs: Secondary | ICD-10-CM

## 2019-08-11 DIAGNOSIS — M19042 Primary osteoarthritis, left hand: Secondary | ICD-10-CM

## 2019-08-11 DIAGNOSIS — Z8659 Personal history of other mental and behavioral disorders: Secondary | ICD-10-CM

## 2019-08-11 DIAGNOSIS — Z8679 Personal history of other diseases of the circulatory system: Secondary | ICD-10-CM

## 2019-08-11 DIAGNOSIS — M19072 Primary osteoarthritis, left ankle and foot: Secondary | ICD-10-CM

## 2019-08-11 DIAGNOSIS — F419 Anxiety disorder, unspecified: Secondary | ICD-10-CM

## 2019-08-11 MED ORDER — METHOTREXATE 2.5 MG PO TABS: ORAL_TABLET | ORAL | 0 refills | Status: DC

## 2019-08-11 NOTE — Patient Instructions (Signed)
Standing Labs We placed an order today for your standing lab work.    Please come back and get your standing labs in 2 weeks, 2 months, then every 3 months   We have open lab daily Monday through Thursday from 8:30-12:30 PM and 1:30-4:30 PM and Friday from 8:30-12:30 PM and 1:30 -4:00 PM at the office of Dr. Bo Merino.   You may experience shorter wait times on Monday and Friday afternoons. The office is located at [address] [place], [place], [place] [address] No appointment is necessary.   Labs are drawn by [facility].  You may receive a bill from [facility] for your lab work.  If you wish to have your labs drawn at another location, please call the office 24 hours in advance to send orders.  If you have any questions regarding directions or hours of operation,  please call [phone].   Just as a reminder please drink plenty of water prior to coming for your lab work. Thanks!

## 2019-08-14 DIAGNOSIS — Z23 Encounter for immunization: Secondary | ICD-10-CM | POA: Diagnosis not present

## 2019-08-18 ENCOUNTER — Other Ambulatory Visit: Payer: Self-pay

## 2019-08-18 DIAGNOSIS — Z79899 Other long term (current) drug therapy: Secondary | ICD-10-CM | POA: Diagnosis not present

## 2019-08-19 LAB — COMPLETE METABOLIC PANEL WITH GFR
AG Ratio: 1.8 (calc) (ref 1.0–2.5)
ALT: 29 U/L (ref 9–46)
AST: 28 U/L (ref 10–35)
Albumin: 4.1 g/dL (ref 3.6–5.1)
Alkaline phosphatase (APISO): 63 U/L (ref 35–144)
BUN: 23 mg/dL (ref 7–25)
CO2: 24 mmol/L (ref 20–32)
Calcium: 9.4 mg/dL (ref 8.6–10.3)
Chloride: 105 mmol/L (ref 98–110)
Creat: 1.14 mg/dL (ref 0.70–1.25)
GFR, Est African American: 80 mL/min/{1.73_m2} (ref >=60)
GFR, Est Non African American: 69 mL/min/{1.73_m2} (ref >=60)
Globulin: 2.3 g/dL (calc) (ref 1.9–3.7)
Glucose, Bld: 125 mg/dL — ABNORMAL HIGH (ref 65–99)
Potassium: 4.7 mmol/L (ref 3.5–5.3)
Sodium: 137 mmol/L (ref 135–146)
Total Bilirubin: 1 mg/dL (ref 0.2–1.2)
Total Protein: 6.4 g/dL (ref 6.1–8.1)

## 2019-08-19 LAB — CBC WITH DIFFERENTIAL/PLATELET
Absolute Monocytes: 529 cells/uL (ref 200–950)
Basophils Absolute: 32 cells/uL (ref 0–200)
Basophils Relative: 0.5 %
Eosinophils Absolute: 170 cells/uL (ref 15–500)
Eosinophils Relative: 2.7 %
HCT: 35.8 % — ABNORMAL LOW (ref 38.5–50.0)
Hemoglobin: 12.2 g/dL — ABNORMAL LOW (ref 13.2–17.1)
Lymphs Abs: 1084 cells/uL (ref 850–3900)
MCH: 31.6 pg (ref 27.0–33.0)
MCHC: 34.1 g/dL (ref 32.0–36.0)
MCV: 92.7 fL (ref 80.0–100.0)
MPV: 9.9 fL (ref 7.5–12.5)
Monocytes Relative: 8.4 %
Neutro Abs: 4486 cells/uL (ref 1500–7800)
Neutrophils Relative %: 71.2 %
Platelets: 269 10*3/uL (ref 140–400)
RBC: 3.86 10*6/uL — ABNORMAL LOW (ref 4.20–5.80)
RDW: 12.2 % (ref 11.0–15.0)
Total Lymphocyte: 17.2 %
WBC: 6.3 10*3/uL (ref 3.8–10.8)

## 2019-08-19 NOTE — Progress Notes (Signed)
Glu is mildly elevated, normal for non-fasting.Mild anemia noted.

## 2019-09-13 ENCOUNTER — Encounter: Payer: Self-pay | Admitting: Rheumatology

## 2019-09-14 NOTE — Telephone Encounter (Signed)
Please call this patient to discuss natural anti-inflammatories.  There is nothing else he can take for pain.  He can take Tylenol on as needed basis.

## 2019-09-14 NOTE — Telephone Encounter (Signed)
Called patient and left voicemail.  Replied via MyChart with OA supplement list.  Instructed patient to call with any questions.

## 2019-09-14 NOTE — Telephone Encounter (Signed)
Wife returned call.  Advised I sent the supplement list via MyChart. She wanted to know reputable brands as she is concerned about quality.  Recommended thorne, jarrow, and naturemade.  Advised he can take Tylenol up to 3 G daily for pain.  Wife verbalized understanding.  Instructed patient/wife to call with any other questions or concerns.   Mariella Saa, PharmD, [place], [place] [facility] Pharmacist [phone]  09/14/2019 4:24 PM

## 2019-09-15 ENCOUNTER — Other Ambulatory Visit: Payer: Self-pay

## 2019-09-15 DIAGNOSIS — Z79899 Other long term (current) drug therapy: Secondary | ICD-10-CM

## 2019-09-16 LAB — CBC WITH DIFFERENTIAL/PLATELET
Absolute Monocytes: 637 cells/uL (ref 200–950)
Basophils Absolute: 40 cells/uL (ref 0–200)
Basophils Relative: 0.6 %
Eosinophils Absolute: 188 cells/uL (ref 15–500)
Eosinophils Relative: 2.8 %
HCT: 36 % — ABNORMAL LOW (ref 38.5–50.0)
Hemoglobin: 12.2 g/dL — ABNORMAL LOW (ref 13.2–17.1)
Lymphs Abs: 1246 cells/uL (ref 850–3900)
MCH: 31.4 pg (ref 27.0–33.0)
MCHC: 33.9 g/dL (ref 32.0–36.0)
MCV: 92.8 fL (ref 80.0–100.0)
MPV: 10.2 fL (ref 7.5–12.5)
Monocytes Relative: 9.5 %
Neutro Abs: 4590 cells/uL (ref 1500–7800)
Neutrophils Relative %: 68.5 %
Platelets: 303 10*3/uL (ref 140–400)
RBC: 3.88 10*6/uL — ABNORMAL LOW (ref 4.20–5.80)
RDW: 12.8 % (ref 11.0–15.0)
Total Lymphocyte: 18.6 %
WBC: 6.7 10*3/uL (ref 3.8–10.8)

## 2019-09-16 LAB — COMPLETE METABOLIC PANEL WITH GFR
AG Ratio: 1.6 (calc) (ref 1.0–2.5)
ALT: 25 U/L (ref 9–46)
AST: 25 U/L (ref 10–35)
Albumin: 3.9 g/dL (ref 3.6–5.1)
Alkaline phosphatase (APISO): 66 U/L (ref 35–144)
BUN: 21 mg/dL (ref 7–25)
CO2: 29 mmol/L (ref 20–32)
Calcium: 9.3 mg/dL (ref 8.6–10.3)
Chloride: 103 mmol/L (ref 98–110)
Creat: 0.97 mg/dL (ref 0.70–1.25)
GFR, Est African American: 97 mL/min/{1.73_m2} (ref >=60)
GFR, Est Non African American: 84 mL/min/{1.73_m2} (ref >=60)
Globulin: 2.5 g/dL (calc) (ref 1.9–3.7)
Glucose, Bld: 110 mg/dL — ABNORMAL HIGH (ref 65–99)
Potassium: 4.7 mmol/L (ref 3.5–5.3)
Sodium: 134 mmol/L — ABNORMAL LOW (ref 135–146)
Total Bilirubin: 0.8 mg/dL (ref 0.2–1.2)
Total Protein: 6.4 g/dL (ref 6.1–8.1)

## 2019-10-02 DIAGNOSIS — F4321 Adjustment disorder with depressed mood: Secondary | ICD-10-CM | POA: Diagnosis not present

## 2019-10-02 DIAGNOSIS — F900 Attention-deficit hyperactivity disorder, predominantly inattentive type: Secondary | ICD-10-CM | POA: Diagnosis not present

## 2019-10-06 DIAGNOSIS — F900 Attention-deficit hyperactivity disorder, predominantly inattentive type: Secondary | ICD-10-CM | POA: Diagnosis not present

## 2019-10-06 DIAGNOSIS — F419 Anxiety disorder, unspecified: Secondary | ICD-10-CM | POA: Diagnosis not present

## 2019-10-06 DIAGNOSIS — Z79899 Other long term (current) drug therapy: Secondary | ICD-10-CM | POA: Diagnosis not present

## 2019-10-08 DIAGNOSIS — L821 Other seborrheic keratosis: Secondary | ICD-10-CM | POA: Diagnosis not present

## 2019-10-08 DIAGNOSIS — D225 Melanocytic nevi of trunk: Secondary | ICD-10-CM | POA: Diagnosis not present

## 2019-10-08 DIAGNOSIS — D1801 Hemangioma of skin and subcutaneous tissue: Secondary | ICD-10-CM | POA: Diagnosis not present

## 2019-11-06 ENCOUNTER — Telehealth: Payer: Self-pay | Admitting: Rheumatology

## 2019-11-06 NOTE — Telephone Encounter (Signed)
Opened in error

## 2019-11-06 NOTE — Progress Notes (Signed)
Virtual Visit via Video Note  I connected with Paul [name] on 11/09/19 at 10:45 AM EST by a video enabled telemedicine application and verified that I am speaking with the correct person using two identifiers.  Location: Patient: Home  Provider: Clinic  This service was conducted via virtual visit.  Both audio and visual tools were used.  The patient was located at home. I was located in my office.  Consent was obtained prior to the virtual visit and is aware of possible charges through their insurance for this visit.  The patient is an established patient.  Dr. Estanislado Pandy, MD conducted the virtual visit and Hazel Sams, PA-C acted as scribe during the service.  Office staff helped with scheduling follow up visits after the service was conducted.   I discussed the limitations of evaluation and management by telemedicine and the availability of in person appointments. The patient expressed understanding and agreed to proceed.  CC: Pain in multiple joints  History of Present Illness: Patient is a 63 year old male with a past medical history of seronegative rheumatoid arthritis and osteoarthritis.  Patient is taking methotrexate 8 tablets by mouth once weekly and folic acid 2 mg po daily.   He has ongoing pain and intermittent swelling in the right wrist joint.  He experiences left shoulder joint pain with cooler and rainy weather.  He has noticed improvement in the discomfort in his feet.  He has been having increased lower back pain.  His nocturnal pain has been significant.  He is not having symptoms of radiculopathy. His fatigue has been significant.     Review of Systems  Constitutional: Positive for malaise/fatigue. Negative for fever.  HENT:       +Dry mouth  Eyes: Negative for photophobia, pain, discharge and redness.  Respiratory: Negative for cough, shortness of breath and wheezing.   Cardiovascular: Negative for chest pain and palpitations.  Gastrointestinal: Positive for  constipation. Negative for blood in stool and diarrhea.  Genitourinary: Negative for dysuria.  Musculoskeletal: Positive for back pain and joint pain. Negative for myalgias and neck pain.  Skin: Negative for rash.  Neurological: Negative for dizziness and headaches.  Psychiatric/Behavioral: Negative for depression. The patient is not nervous/anxious and does not have insomnia.     Observations/Objective: Physical Exam  Constitutional: He is oriented to person, place, and time and well-developed, well-nourished, and in no distress.  HENT:  Head: Normocephalic and atraumatic.  Eyes: Conjunctivae are normal.  Pulmonary/Chest: Effort normal.  Neurological: He is alert and oriented to person, place, and time.  Psychiatric: Mood, memory, affect and judgment normal.   Patient reports morning stiffness for several hours.   Patient reports nocturnal pain.  Difficulty dressing/grooming: Denies Difficulty climbing stairs: Reports Difficulty getting out of chair: Reports Difficulty using hands for taps, buttons, cutlery, and/or writing: Reports  He rates his RA a 2/10.    Assessment and Plan: Visit Diagnoses: Seronegative rheumatoid arthritis ([place]) - RF-, CCP-:  He has persistent pain and intermittent inflammation in the right wrist joint.  He has noticed improvement in the discomfort in both feet since starting on MTX.  He is taking Methotrexate 8 tablets by mouth once weekly and folic acid 2 mg po daily, which he started on 07/22/19.  We discussed switching to the injectable form of MTX to increase the efficacy but he declined at this time.  We discussed adding on Plaquenil 200 mg 1 tablet by mouth twice daily.  Indications, contraindications, and potential side effects of  PLQ were discussed.  All questions were addressed.  He will need to sign the consent form prior to sending in the prescription. He was advised to notify us if he cannot tolerate taking PLQ.  He will follow up in 2 months.    Patient was counseled on the purpose, proper use, and adverse effects of hydroxychloroquine including nausea/diarrhea, skin rash, headaches, and sun sensitivity.  Discussed importance of annual eye exams while on hydroxychloroquine to monitor to ocular toxicity and discussed importance of frequent laboratory monitoring.  Provided patient with eye exam form for baseline ophthalmologic exam.  Provided patient with educational materials on hydroxychloroquine and answered all questions.  Patient consented to hydroxychloroquine.  Will upload consent in the media tab.    Dose will be Plaquenil 200 mg twice daily.  Prescription pending lab results.    High risk medication use - Methotrexate 8 tablets every 7 days and folic acid 1 mg 2 tablets daily started on 07/22/2019.  CBC and CMP were drawn on 09/15/19. He will be due to update lab work in January (1 month after starting PLQ) and every 3 months.   Primary osteoarthritis of both hands: He has ongoing pain and intermittent inflammation in the right wrist joint.  Joint protection and muscle strengthening were discussed.   Primary osteoarthritis of both feet: The discomfort in both feet have improved since starting on MTX.  He wears proper fitting shoes.   Elevated CK - CK in 400s in the past.  Aldolase and myositis panel was negative in the past.  He also had EMG nerve conduction velocity done by neurology which did not show myopathic process. CK was 277 on 08/04/2019. He is not having increased muscle weakness at this time.   DDD (degenerative disc disease), lumbar: He is having increased lower back pain. He has been experiencing significant nocturnal pain.  X-rays of the lumbar spine were obtained on 09/02/18 which revealed multilevel spondylosis and facet joint arthropathy.  He is having severe midline lower back pain.  No symptoms of radiculopathy. We discussed a referral to physical therapist, but he would like to see an orthopedist first. We will  refer the patient to [facility] for further evaluation and treatment by Dr. Otelia Sergeant.  We will place a order for a MRI of the lumbar spine. We will provide a handout of back exercises to perform.  Other medical conditions are listed as follows:   Anxiety and depression  History of BPH  History of hyperlipidemia  History of varicose veins  History of ADHD  History of hypothyroidism   Follow Up Instructions: He will follow up in 2 months.    I discussed the assessment and treatment plan with the patient. The patient was provided an opportunity to ask questions and all were answered. The patient agreed with the plan and demonstrated an understanding of the instructions.   The patient was advised to call back or seek an in-person evaluation if the symptoms worsen or if the condition fails to improve as anticipated.  I provided 25 minutes of non-face-to-face time during this encounter.   Pollyann Savoy, MD   Scribed by-  Sherron Ales, PA-C

## 2019-11-09 ENCOUNTER — Encounter: Payer: Self-pay | Admitting: Rheumatology

## 2019-11-09 ENCOUNTER — Other Ambulatory Visit: Payer: Self-pay

## 2019-11-09 ENCOUNTER — Other Ambulatory Visit: Payer: Self-pay | Admitting: Physician Assistant

## 2019-11-09 ENCOUNTER — Telehealth (INDEPENDENT_AMBULATORY_CARE_PROVIDER_SITE_OTHER): Payer: BC Managed Care – PPO | Admitting: Rheumatology

## 2019-11-09 DIAGNOSIS — F419 Anxiety disorder, unspecified: Secondary | ICD-10-CM

## 2019-11-09 DIAGNOSIS — M19041 Primary osteoarthritis, right hand: Secondary | ICD-10-CM | POA: Diagnosis not present

## 2019-11-09 DIAGNOSIS — M19071 Primary osteoarthritis, right ankle and foot: Secondary | ICD-10-CM

## 2019-11-09 DIAGNOSIS — Z8659 Personal history of other mental and behavioral disorders: Secondary | ICD-10-CM

## 2019-11-09 DIAGNOSIS — R748 Abnormal levels of other serum enzymes: Secondary | ICD-10-CM

## 2019-11-09 DIAGNOSIS — Z87438 Personal history of other diseases of male genital organs: Secondary | ICD-10-CM

## 2019-11-09 DIAGNOSIS — M0600 Rheumatoid arthritis without rheumatoid factor, unspecified site: Secondary | ICD-10-CM | POA: Diagnosis not present

## 2019-11-09 DIAGNOSIS — Z79899 Other long term (current) drug therapy: Secondary | ICD-10-CM

## 2019-11-09 DIAGNOSIS — Z8679 Personal history of other diseases of the circulatory system: Secondary | ICD-10-CM

## 2019-11-09 DIAGNOSIS — M51369 Other intervertebral disc degeneration, lumbar region without mention of lumbar back pain or lower extremity pain: Secondary | ICD-10-CM

## 2019-11-09 DIAGNOSIS — M19042 Primary osteoarthritis, left hand: Secondary | ICD-10-CM

## 2019-11-09 DIAGNOSIS — F32A Depression, unspecified: Secondary | ICD-10-CM

## 2019-11-09 DIAGNOSIS — M19072 Primary osteoarthritis, left ankle and foot: Secondary | ICD-10-CM

## 2019-11-09 DIAGNOSIS — M5136 Other intervertebral disc degeneration, lumbar region: Secondary | ICD-10-CM

## 2019-11-09 DIAGNOSIS — Z8639 Personal history of other endocrine, nutritional and metabolic disease: Secondary | ICD-10-CM

## 2019-11-09 DIAGNOSIS — F329 Major depressive disorder, single episode, unspecified: Secondary | ICD-10-CM

## 2019-11-09 NOTE — Telephone Encounter (Signed)
Last Visit: 11/09/2019 telemedicine  Next Visit: 12/16/2019 Labs: 09/15/2019 stable   Okay to refill per Dr. Estanislado Pandy.

## 2019-11-09 NOTE — Patient Instructions (Addendum)
Standing Labs We placed an order today for your standing lab work.    Please come back and get your standing labs in 1 month and then every 3 months.   We have open lab daily Monday through Thursday from 8:30-12:30 PM and 1:30-4:30 PM and Friday from 8:30-12:30 PM and 1:30-4:00 PM at the office of Dr. Bo Merino.   You may experience shorter wait times on Monday and Friday afternoons. The office is located at [address], [place], [place], [place] [address] No appointment is necessary.   Labs are drawn by [facility].  You may receive a bill from [facility] for your lab work.  If you wish to have your labs drawn at another location, please call the office 24 hours in advance to send orders.  If you have any questions regarding directions or hours of operation,  please call [phone].   Just as a reminder please drink plenty of water prior to coming for your lab work. Thanks!    Hydroxychloroquine tablets What is this medicine? HYDROXYCHLOROQUINE (hye drox ee KLOR oh kwin) is used to treat rheumatoid arthritis and systemic lupus erythematosus. It is also used to treat malaria. This medicine may be used for other purposes; ask your health care provider or pharmacist if you have questions. COMMON BRAND NAME(S): Plaquenil, Quineprox What should I tell my health care provider before I take this medicine? They need to know if you have any of these conditions:  diabetes  eye disease, vision problems  G6PD deficiency  heart disease  history of irregular heartbeat  if you often drink alcohol  kidney disease  liver disease  porphyria  psoriasis  an unusual or allergic reaction to chloroquine, hydroxychloroquine, other medicines, foods, dyes, or preservatives  pregnant or trying to get pregnant  breast-feeding How should I use this medicine? Take this medicine by mouth with a glass of water. Follow the directions on the prescription label. Do not cut, crush or chew  this medicine. Swallow the tablets whole. Take this medicine with food. Avoid taking antacids within 4 hours of taking this medicine. It is best to separate these medicines by at least 4 hours. Take your medicine at regular intervals. Do not take it more often than directed. Take all of your medicine as directed even if you think you are better. Do not skip doses or stop your medicine early. Talk to your pediatrician regarding the use of this medicine in children. While this drug may be prescribed for selected conditions, precautions do apply. Overdosage: If you think you have taken too much of this medicine contact a poison control center or emergency room at once. NOTE: This medicine is only for you. Do not share this medicine with others. What if I miss a dose? If you miss a dose, take it as soon as you can. If it is almost time for your next dose, take only that dose. Do not take double or extra doses. What may interact with this medicine? Do not take this medicine with any of the following medications:  cisapride  dronedarone  pimozide  thioridazine This medicine may also interact with the following medications:  ampicillin  antacids  cimetidine  cyclosporine  digoxin  kaolin  medicines for diabetes, like insulin, glipizide, glyburide  medicines for seizures like carbamazepine, phenobarbital, phenytoin  mefloquine  methotrexate  other medicines that prolong the QT interval (cause an abnormal heart rhythm)  praziquantel This list may not describe all possible interactions. Give your health care provider  a list of all the medicines, herbs, non-prescription drugs, or dietary supplements you use. Also tell them if you smoke, drink alcohol, or use illegal drugs. Some items may interact with your medicine. What should I watch for while using this medicine? Visit your health care professional for regular checks on your progress. Tell your health care professional if your  symptoms do not start to get better or if they get worse. You may need blood work done while you are taking this medicine. If you take other medicines that can affect heart rhythm, you may need more testing. Talk to your health care professional if you have questions. Your vision may be tested before and during use of this medicine. Tell your health care professional right away if you have any change in your eyesight. What side effects may I notice from receiving this medicine? Side effects that you should report to your doctor or health care professional as soon as possible:  allergic reactions like skin rash, itching or hives, swelling of the face, lips, or tongue  changes in vision  decreased hearing or ringing of the ears  muscle weakness  redness, blistering, peeling or loosening of the skin, including inside the mouth  sensitivity to light  signs and symptoms of a dangerous change in heartbeat or heart rhythm like chest pain; dizziness; fast or irregular heartbeat; palpitations; feeling faint or lightheaded, falls; breathing problems  signs and symptoms of liver injury like dark yellow or brown urine; general ill feeling or flu-like symptoms; light-colored stools; loss of appetite; nausea; right upper belly pain; unusually weak or tired; yellowing of the eyes or skin  signs and symptoms of low blood sugar such as feeling anxious; confusion; dizziness; increased hunger; unusually weak or tired; sweating; shakiness; cold; irritable; headache; blurred vision; fast heartbeat; loss of consciousness  suicidal thoughts  uncontrollable head, mouth, neck, arm, or leg movements Side effects that usually do not require medical attention (report to your doctor or health care professional if they continue or are bothersome):  diarrhea  dizziness  hair loss  headache  irritable  loss of appetite  nausea, vomiting  stomach pain This list may not describe all possible side effects.  Call your doctor for medical advice about side effects. You may report side effects to FDA at 1-800-FDA-1088. Where should I keep my medicine? Keep out of the reach of children. Store at room temperature between 15 and 30 degrees C (59 and 86 degrees F). Protect from moisture and light. Throw away any unused medicine after the expiration date. NOTE: This sheet is a summary. It may not cover all possible information. If you have questions about this medicine, talk to your doctor, pharmacist, or health care provider.  2020 Elsevier/Gold Standard (2019-03-30 12:56:32)     Back Exercises These exercises help to make your trunk and back strong. They also help to keep the lower back flexible. Doing these exercises can help to prevent back pain or lessen existing pain.  If you have back pain, try to do these exercises 2-3 times each day or as told by your doctor.  As you get better, do the exercises once each day. Repeat the exercises more often as told by your doctor.  To stop back pain from coming back, do the exercises once each day, or as told by your doctor. Exercises Single knee to chest Do these steps 3-5 times in a row for each leg: 1. Lie on your back on a firm bed or the  floor with your legs stretched out. 2. Bring one knee to your chest. 3. Grab your knee or thigh with both hands and hold them it in place. 4. Pull on your knee until you feel a gentle stretch in your lower back or buttocks. 5. Keep doing the stretch for 10-30 seconds. 6. Slowly let go of your leg and straighten it. Pelvic tilt Do these steps 5-10 times in a row: 1. Lie on your back on a firm bed or the floor with your legs stretched out. 2. Bend your knees so they point up to the ceiling. Your feet should be flat on the floor. 3. Tighten your lower belly (abdomen) muscles to press your lower back against the floor. This will make your tailbone point up to the ceiling instead of pointing down to your feet or the  floor. 4. Stay in this position for 5-10 seconds while you gently tighten your muscles and breathe evenly. Cat-cow Do these steps until your lower back bends more easily: 1. Get on your hands and knees on a firm surface. Keep your hands under your shoulders, and keep your knees under your hips. You may put padding under your knees. 2. Let your head hang down toward your chest. Tighten (contract) the muscles in your belly. Point your tailbone toward the floor so your lower back becomes rounded like the back of a cat. 3. Stay in this position for 5 seconds. 4. Slowly lift your head. Let the muscles of your belly relax. Point your tailbone up toward the ceiling so your back forms a sagging arch like the back of a cow. 5. Stay in this position for 5 seconds.  Press-ups Do these steps 5-10 times in a row: 1. Lie on your belly (face-down) on the floor. 2. Place your hands near your head, about shoulder-width apart. 3. While you keep your back relaxed and keep your hips on the floor, slowly straighten your arms to raise the top half of your body and lift your shoulders. Do not use your back muscles. You may change where you place your hands in order to make yourself more comfortable. 4. Stay in this position for 5 seconds. 5. Slowly return to lying flat on the floor.  Bridges Do these steps 10 times in a row: 1. Lie on your back on a firm surface. 2. Bend your knees so they point up to the ceiling. Your feet should be flat on the floor. Your arms should be flat at your sides, next to your body. 3. Tighten your butt muscles and lift your butt off the floor until your waist is almost as high as your knees. If you do not feel the muscles working in your butt and the back of your thighs, slide your feet 1-2 inches farther away from your butt. 4. Stay in this position for 3-5 seconds. 5. Slowly lower your butt to the floor, and let your butt muscles relax. If this exercise is too easy, try doing it with  your arms crossed over your chest. Belly crunches Do these steps 5-10 times in a row: 1. Lie on your back on a firm bed or the floor with your legs stretched out. 2. Bend your knees so they point up to the ceiling. Your feet should be flat on the floor. 3. Cross your arms over your chest. 4. Tip your chin a little bit toward your chest but do not bend your neck. 5. Tighten your belly muscles and slowly raise your chest  just enough to lift your shoulder blades a tiny bit off of the floor. Avoid raising your body higher than that, because it can put too much stress on your low back. 6. Slowly lower your chest and your head to the floor. Back lifts Do these steps 5-10 times in a row: 1. Lie on your belly (face-down) with your arms at your sides, and rest your forehead on the floor. 2. Tighten the muscles in your legs and your butt. 3. Slowly lift your chest off of the floor while you keep your hips on the floor. Keep the back of your head in line with the curve in your back. Look at the floor while you do this. 4. Stay in this position for 3-5 seconds. 5. Slowly lower your chest and your face to the floor. Contact a doctor if:  Your back pain gets a lot worse when you do an exercise.  Your back pain does not get better 2 hours after you exercise. If you have any of these problems, stop doing the exercises. Do not do them again unless your doctor says it is okay. Get help right away if:  You have sudden, very bad back pain. If this happens, stop doing the exercises. Do not do them again unless your doctor says it is okay. This information is not intended to replace advice given to you by your health care provider. Make sure you discuss any questions you have with your health care provider. Document Released: 12/22/2010 Document Revised: 08/14/2018 Document Reviewed: 08/14/2018 Elsevier Patient Education  2020 [employer].

## 2019-11-10 ENCOUNTER — Other Ambulatory Visit: Payer: Self-pay | Admitting: *Deleted

## 2019-11-10 DIAGNOSIS — M5136 Other intervertebral disc degeneration, lumbar region: Secondary | ICD-10-CM

## 2019-11-10 MED ORDER — HYDROXYCHLOROQUINE SULFATE 200 MG PO TABS: 200.0000 mg | ORAL_TABLET | Freq: Two times a day (BID) | ORAL | 0 refills | Status: DC

## 2019-11-16 ENCOUNTER — Telehealth: Payer: Self-pay | Admitting: Rheumatology

## 2019-11-16 NOTE — Telephone Encounter (Signed)
I spoke with [facility] [employer] in reference for approval for Lspine MRI. Per insurance, patient does not meet criteria for the MRI. Dr. Estanislado Pandy can call to do at [facility] peer at [phone].

## 2019-11-16 NOTE — Telephone Encounter (Signed)
Advised patient of information below, patient verbalized understanding and will see Dr. Louanne Skye and proceed from there.

## 2019-11-16 NOTE — Telephone Encounter (Signed)
We referred patient to Dr. Louanne Skye and he is scheduled with him on 12/17/2019.

## 2019-11-16 NOTE — Telephone Encounter (Signed)
Please notify patient that patient has appointment coming up with Dr. Louanne Skye.

## 2019-11-18 ENCOUNTER — Telehealth: Payer: Self-pay | Admitting: Rheumatology

## 2019-11-18 NOTE — Telephone Encounter (Signed)
Spoke with [place] and plaquenil required a prior authorization.  Completed PA via [email or web address] and it is approved Effective from 11/18/2019 through 11/16/2022.  Advised patient's wife and she verbalized understanding.

## 2019-11-18 NOTE — Telephone Encounter (Signed)
Patient's wife Denny Peon called stating [facility] is still waiting on authorization from Dr. Estanislado Pandy before they can fill patient's prescription of Plaquenil.

## 2019-11-24 DIAGNOSIS — F332 Major depressive disorder, recurrent severe without psychotic features: Secondary | ICD-10-CM | POA: Diagnosis not present

## 2019-12-03 ENCOUNTER — Encounter: Payer: Self-pay | Admitting: Rheumatology

## 2019-12-03 DIAGNOSIS — H35033 Hypertensive retinopathy, bilateral: Secondary | ICD-10-CM | POA: Diagnosis not present

## 2019-12-03 DIAGNOSIS — H2513 Age-related nuclear cataract, bilateral: Secondary | ICD-10-CM | POA: Diagnosis not present

## 2019-12-03 DIAGNOSIS — H25013 Cortical age-related cataract, bilateral: Secondary | ICD-10-CM | POA: Diagnosis not present

## 2019-12-03 DIAGNOSIS — Z79899 Other long term (current) drug therapy: Secondary | ICD-10-CM | POA: Diagnosis not present
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
## 2019-12-16 ENCOUNTER — Ambulatory Visit: Payer: BC Managed Care – PPO | Admitting: Physician Assistant

## 2019-12-17 ENCOUNTER — Ambulatory Visit: Payer: Self-pay

## 2019-12-17 ENCOUNTER — Ambulatory Visit: Payer: BC Managed Care – PPO | Admitting: Specialist

## 2019-12-17 ENCOUNTER — Other Ambulatory Visit: Payer: Self-pay

## 2019-12-17 ENCOUNTER — Other Ambulatory Visit: Payer: Self-pay | Admitting: *Deleted

## 2019-12-17 ENCOUNTER — Encounter: Payer: Self-pay | Admitting: Specialist

## 2019-12-17 VITALS — BP 133/83 | HR 52 | Ht 72.0 in | Wt 188.0 lb

## 2019-12-17 DIAGNOSIS — M5136 Other intervertebral disc degeneration, lumbar region: Secondary | ICD-10-CM

## 2019-12-17 DIAGNOSIS — Z79899 Other long term (current) drug therapy: Secondary | ICD-10-CM | POA: Diagnosis not present

## 2019-12-17 DIAGNOSIS — R748 Abnormal levels of other serum enzymes: Secondary | ICD-10-CM | POA: Diagnosis not present

## 2019-12-17 MED ORDER — TRAMADOL-ACETAMINOPHEN 37.5-325 MG PO TABS: 1.0000 | ORAL_TABLET | Freq: Four times a day (QID) | ORAL | 0 refills | Status: DC | PRN

## 2019-12-17 MED ORDER — GABAPENTIN 300 MG PO CAPS: 300.0000 mg | ORAL_CAPSULE | Freq: Every day | ORAL | 3 refills | Status: DC

## 2019-12-17 NOTE — Patient Instructions (Signed)
Plan: Avoid frequent bending and stooping  No lifting greater than 10 lbs. May use ice or moist heat for pain. Weight loss is of benefit. Best medication for lumbar disc disease is arthritis medications and you are on strong arthritis medications already. Exercise is important to improve your indurance and does allow people to function better inspite of back pain. Referral to Physical therapy for an exercise program. Hemp CBD capsules, amazon.com 5,000-7,000 mg per bottle, 60 capsules per bottle, take one capsule twice a day.
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
## 2019-12-18 LAB — CBC WITH DIFFERENTIAL/PLATELET
Absolute Monocytes: 666 cells/uL (ref 200–950)
Basophils Absolute: 48 cells/uL (ref 0–200)
Basophils Relative: 0.8 %
Eosinophils Absolute: 204 cells/uL (ref 15–500)
Eosinophils Relative: 3.4 %
HCT: 36.1 % — ABNORMAL LOW (ref 38.5–50.0)
Hemoglobin: 12.3 g/dL — ABNORMAL LOW (ref 13.2–17.1)
Lymphs Abs: 1212 cells/uL (ref 850–3900)
MCH: 31.9 pg (ref 27.0–33.0)
MCHC: 34.1 g/dL (ref 32.0–36.0)
MCV: 93.5 fL (ref 80.0–100.0)
MPV: 10 fL (ref 7.5–12.5)
Monocytes Relative: 11.1 %
Neutro Abs: 3870 cells/uL (ref 1500–7800)
Neutrophils Relative %: 64.5 %
Platelets: 311 10*3/uL (ref 140–400)
RBC: 3.86 10*6/uL — ABNORMAL LOW (ref 4.20–5.80)
RDW: 12.7 % (ref 11.0–15.0)
Total Lymphocyte: 20.2 %
WBC: 6 10*3/uL (ref 3.8–10.8)

## 2019-12-18 LAB — COMPLETE METABOLIC PANEL WITH GFR
AG Ratio: 1.7 (calc) (ref 1.0–2.5)
ALT: 35 U/L (ref 9–46)
AST: 31 U/L (ref 10–35)
Albumin: 4 g/dL (ref 3.6–5.1)
Alkaline phosphatase (APISO): 61 U/L (ref 35–144)
BUN: 18 mg/dL (ref 7–25)
CO2: 30 mmol/L (ref 20–32)
Calcium: 9.5 mg/dL (ref 8.6–10.3)
Chloride: 105 mmol/L (ref 98–110)
Creat: 1.08 mg/dL (ref 0.70–1.25)
GFR, Est African American: 85 mL/min/{1.73_m2} (ref >=60)
GFR, Est Non African American: 74 mL/min/{1.73_m2} (ref >=60)
Globulin: 2.4 g/dL (calc) (ref 1.9–3.7)
Glucose, Bld: 118 mg/dL — ABNORMAL HIGH (ref 65–99)
Potassium: 4.3 mmol/L (ref 3.5–5.3)
Sodium: 140 mmol/L (ref 135–146)
Total Bilirubin: 0.9 mg/dL (ref 0.2–1.2)
Total Protein: 6.4 g/dL (ref 6.1–8.1)

## 2019-12-18 LAB — CK: Total CK: 263 U/L — ABNORMAL HIGH (ref 44–196)

## 2019-12-18 NOTE — Progress Notes (Signed)
Labs are stable. Mild anemia, CK is mildly elevated.

## 2019-12-23 ENCOUNTER — Encounter: Payer: Self-pay | Admitting: Cardiovascular Disease

## 2019-12-23 ENCOUNTER — Other Ambulatory Visit: Payer: Self-pay

## 2019-12-23 ENCOUNTER — Ambulatory Visit: Payer: BC Managed Care – PPO | Admitting: Cardiovascular Disease

## 2019-12-23 VITALS — BP 106/64 | HR 57 | Ht 72.0 in | Wt 185.8 lb

## 2019-12-23 DIAGNOSIS — Z8249 Family history of ischemic heart disease and other diseases of the circulatory system: Secondary | ICD-10-CM | POA: Diagnosis not present

## 2019-12-23 DIAGNOSIS — R931 Abnormal findings on diagnostic imaging of heart and coronary circulation: Secondary | ICD-10-CM

## 2019-12-23 NOTE — Patient Instructions (Addendum)

## 2019-12-28 ENCOUNTER — Ambulatory Visit: Payer: BC Managed Care – PPO | Attending: Specialist

## 2019-12-28 ENCOUNTER — Other Ambulatory Visit: Payer: Self-pay

## 2019-12-28 DIAGNOSIS — M25651 Stiffness of right hip, not elsewhere classified: Secondary | ICD-10-CM | POA: Diagnosis not present

## 2019-12-28 DIAGNOSIS — M25652 Stiffness of left hip, not elsewhere classified: Secondary | ICD-10-CM

## 2019-12-28 DIAGNOSIS — M545 Low back pain, unspecified: Secondary | ICD-10-CM

## 2019-12-28 DIAGNOSIS — G8929 Other chronic pain: Secondary | ICD-10-CM | POA: Diagnosis not present
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
## 2019-12-28 NOTE — Patient Instructions (Signed)
Access Code: [email or web address]  URL: [email or web address]  Date: 12/28/2019  Prepared by: Lorrene Reid   Exercises Supine Lower Trunk Rotation - 3 reps - 1 sets - 20 hold - 3x daily - 7x weekly Hooklying Single Knee to Chest - 3 reps - 1 sets - 20 hold - 3x daily - 7x weekly Seated Hamstring Stretch - 3 reps - 1 sets - 20 hold - 3x daily - 7x weekly Seated Piriformis Stretch with Trunk Bend - 10 reps - 3 sets - 1x daily - 7x weekly

## 2019-12-31 ENCOUNTER — Telehealth: Payer: Self-pay | Admitting: Rheumatology

## 2019-12-31 NOTE — Telephone Encounter (Signed)
Patient's wife [name] left a voicemail stating she has questions regarding Paul [name]'s disability forms and requesting a return call.

## 2020-01-01 NOTE — Telephone Encounter (Signed)
Attempted to contact Paul [name] and left message for patient to call the office.

## 2020-01-04 ENCOUNTER — Other Ambulatory Visit: Payer: Self-pay

## 2020-01-04 ENCOUNTER — Ambulatory Visit: Payer: BC Managed Care – PPO | Attending: Specialist

## 2020-01-04 DIAGNOSIS — M25651 Stiffness of right hip, not elsewhere classified: Secondary | ICD-10-CM | POA: Insufficient documentation

## 2020-01-04 DIAGNOSIS — M545 Low back pain: Secondary | ICD-10-CM | POA: Insufficient documentation

## 2020-01-04 DIAGNOSIS — G8929 Other chronic pain: Secondary | ICD-10-CM | POA: Insufficient documentation

## 2020-01-04 DIAGNOSIS — M25652 Stiffness of left hip, not elsewhere classified: Secondary | ICD-10-CM | POA: Insufficient documentation
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
## 2020-01-04 NOTE — Patient Instructions (Signed)
Access Code: [email or web address]  URL: [email or web address]  Date: 01/04/2020  Prepared by: Lorrene Reid    Clamshell - 10 reps - 3 sets - 2x daily - 7x weekly Supine Transversus Abdominis Bracing - Hands on Ground - 10 reps - 1 sets - 5 hold - 2x daily - 7x weekly Seated Transversus Abdominis Bracing - 10 reps - 1 sets - 2x daily - 7x weekly Modified Thomas Stretch - 3 reps - 1 sets - 20 hold - 2x daily - 7x weekly

## 2020-01-05 ENCOUNTER — Encounter: Payer: Self-pay | Admitting: Rheumatology

## 2020-01-11 ENCOUNTER — Other Ambulatory Visit: Payer: Self-pay

## 2020-01-11 ENCOUNTER — Ambulatory Visit: Payer: BC Managed Care – PPO

## 2020-01-11 DIAGNOSIS — M25651 Stiffness of right hip, not elsewhere classified: Secondary | ICD-10-CM

## 2020-01-11 DIAGNOSIS — M545 Low back pain, unspecified: Secondary | ICD-10-CM

## 2020-01-11 DIAGNOSIS — M25652 Stiffness of left hip, not elsewhere classified: Secondary | ICD-10-CM | POA: Diagnosis not present

## 2020-01-11 DIAGNOSIS — G8929 Other chronic pain: Secondary | ICD-10-CM | POA: Diagnosis not present

## 2020-01-11 NOTE — Patient Instructions (Signed)
Access Code: [email or web address]  URL: [email or web address]  Date: 01/11/2020  Prepared by: Lorrene Reid   Exercises  Standing Hip Abduction - 10 reps - 2 sets - 1x daily - 7x weekly Standing Hip Extension - 10 reps - 2 sets - 1x daily - 7x weekly
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
## 2020-01-13 DIAGNOSIS — F900 Attention-deficit hyperactivity disorder, predominantly inattentive type: Secondary | ICD-10-CM | POA: Diagnosis not present
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
## 2020-01-19 ENCOUNTER — Ambulatory Visit: Payer: BC Managed Care – PPO | Admitting: Rheumatology

## 2020-01-20 DIAGNOSIS — F900 Attention-deficit hyperactivity disorder, predominantly inattentive type: Secondary | ICD-10-CM | POA: Diagnosis not present
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
## 2020-01-25 ENCOUNTER — Ambulatory Visit: Payer: BC Managed Care – PPO

## 2020-01-25 ENCOUNTER — Other Ambulatory Visit: Payer: Self-pay

## 2020-01-25 DIAGNOSIS — G8929 Other chronic pain: Secondary | ICD-10-CM | POA: Diagnosis not present

## 2020-01-25 DIAGNOSIS — M25651 Stiffness of right hip, not elsewhere classified: Secondary | ICD-10-CM

## 2020-01-25 DIAGNOSIS — M25652 Stiffness of left hip, not elsewhere classified: Secondary | ICD-10-CM

## 2020-01-25 DIAGNOSIS — M545 Low back pain: Secondary | ICD-10-CM | POA: Diagnosis not present
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
## 2020-01-26 DIAGNOSIS — F900 Attention-deficit hyperactivity disorder, predominantly inattentive type: Secondary | ICD-10-CM | POA: Diagnosis not present

## 2020-01-26 DIAGNOSIS — F321 Major depressive disorder, single episode, moderate: Secondary | ICD-10-CM | POA: Diagnosis not present

## 2020-01-27 ENCOUNTER — Other Ambulatory Visit: Payer: Self-pay

## 2020-01-27 ENCOUNTER — Encounter: Payer: Self-pay | Admitting: Physician Assistant

## 2020-01-27 ENCOUNTER — Ambulatory Visit: Payer: BC Managed Care – PPO | Admitting: Physician Assistant

## 2020-01-27 VITALS — BP 114/66 | HR 59 | Resp 15 | Ht 72.0 in | Wt 190.2 lb

## 2020-01-27 DIAGNOSIS — F32A Depression, unspecified: Secondary | ICD-10-CM

## 2020-01-27 DIAGNOSIS — R748 Abnormal levels of other serum enzymes: Secondary | ICD-10-CM

## 2020-01-27 DIAGNOSIS — F419 Anxiety disorder, unspecified: Secondary | ICD-10-CM

## 2020-01-27 DIAGNOSIS — Z79899 Other long term (current) drug therapy: Secondary | ICD-10-CM

## 2020-01-27 DIAGNOSIS — M19041 Primary osteoarthritis, right hand: Secondary | ICD-10-CM | POA: Diagnosis not present

## 2020-01-27 DIAGNOSIS — M19071 Primary osteoarthritis, right ankle and foot: Secondary | ICD-10-CM | POA: Diagnosis not present

## 2020-01-27 DIAGNOSIS — M19072 Primary osteoarthritis, left ankle and foot: Secondary | ICD-10-CM

## 2020-01-27 DIAGNOSIS — F329 Major depressive disorder, single episode, unspecified: Secondary | ICD-10-CM

## 2020-01-27 DIAGNOSIS — Z8639 Personal history of other endocrine, nutritional and metabolic disease: Secondary | ICD-10-CM

## 2020-01-27 DIAGNOSIS — M5136 Other intervertebral disc degeneration, lumbar region: Secondary | ICD-10-CM

## 2020-01-27 DIAGNOSIS — M19042 Primary osteoarthritis, left hand: Secondary | ICD-10-CM

## 2020-01-27 DIAGNOSIS — Z8679 Personal history of other diseases of the circulatory system: Secondary | ICD-10-CM

## 2020-01-27 DIAGNOSIS — M0600 Rheumatoid arthritis without rheumatoid factor, unspecified site: Secondary | ICD-10-CM | POA: Diagnosis not present

## 2020-01-27 DIAGNOSIS — Z8659 Personal history of other mental and behavioral disorders: Secondary | ICD-10-CM

## 2020-01-27 DIAGNOSIS — Z87438 Personal history of other diseases of male genital organs: Secondary | ICD-10-CM

## 2020-01-27 MED ORDER — METHOTREXATE SODIUM 2.5 MG PO TABS: ORAL_TABLET | ORAL | 0 refills | Status: DC

## 2020-01-27 NOTE — Patient Instructions (Signed)
Standing Labs We placed an order today for your standing lab work.    Please come back and get your standing labs in April and every 3 months   We have open lab daily Monday through Thursday from 8:30-12:30 PM and 1:30-4:30 PM and Friday from 8:30-12:30 PM and 1:30-4:00 PM at the office of Dr. Shaili Deveshwar.   You may experience shorter wait times on Monday and Friday afternoons. The office is located at [address], [address], [place], [place] [address] No appointment is necessary.   Labs are drawn by [facility].  You may receive a bill from [facility] for your lab work.  If you wish to have your labs drawn at another location, please call the office 24 hours in advance to send orders.  If you have any questions regarding directions or hours of operation,  please call [phone].   Just as a reminder please drink plenty of water prior to coming for your lab work. Thanks!   

## 2020-01-28 ENCOUNTER — Encounter: Payer: Self-pay | Admitting: Specialist

## 2020-01-28 ENCOUNTER — Other Ambulatory Visit: Payer: Self-pay

## 2020-01-28 ENCOUNTER — Ambulatory Visit (INDEPENDENT_AMBULATORY_CARE_PROVIDER_SITE_OTHER): Payer: BC Managed Care – PPO | Admitting: Specialist

## 2020-01-28 VITALS — BP 109/67 | HR 64 | Ht 72.0 in | Wt 190.0 lb

## 2020-01-28 DIAGNOSIS — M5136 Other intervertebral disc degeneration, lumbar region: Secondary | ICD-10-CM

## 2020-01-28 DIAGNOSIS — F419 Anxiety disorder, unspecified: Secondary | ICD-10-CM | POA: Diagnosis not present

## 2020-01-28 DIAGNOSIS — Z79899 Other long term (current) drug therapy: Secondary | ICD-10-CM | POA: Diagnosis not present

## 2020-01-28 DIAGNOSIS — F900 Attention-deficit hyperactivity disorder, predominantly inattentive type: Secondary | ICD-10-CM | POA: Diagnosis not present

## 2020-01-28 MED ORDER — TRAMADOL HCL 50 MG PO TABS: 100.0000 mg | ORAL_TABLET | Freq: Four times a day (QID) | ORAL | 0 refills | Status: AC | PRN
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
## 2020-01-28 NOTE — Patient Instructions (Addendum)
Plan: Avoid frequent bending and stooping  No lifting greater than 10 lbs. May use ice or moist heat for pain. Weight loss is of benefit. Best medication for lumbar disc disease is arthritis medications and you are on strong arthritis medications already. Exercise is important to improve your indurance and does allow people to function better inspite of back pain. Referral to Physical therapy for an exercise program. Hemp CBD capsules, amazon.com 5,000-7,000 mg per bottle, 60 capsules per bottle, take one capsule twice a day. Discontinue the tylenol and continue as tramadol 50 mg up to every 6 hours.

## 2020-02-02 DIAGNOSIS — F900 Attention-deficit hyperactivity disorder, predominantly inattentive type: Secondary | ICD-10-CM | POA: Diagnosis not present

## 2020-02-07 DIAGNOSIS — R079 Chest pain, unspecified: Secondary | ICD-10-CM | POA: Diagnosis not present

## 2020-02-08 ENCOUNTER — Other Ambulatory Visit: Payer: Self-pay | Admitting: Cardiovascular Disease

## 2020-02-09 ENCOUNTER — Other Ambulatory Visit: Payer: Self-pay | Admitting: Rheumatology

## 2020-02-09 ENCOUNTER — Other Ambulatory Visit: Payer: Self-pay | Admitting: Specialist

## 2020-02-09 DIAGNOSIS — F900 Attention-deficit hyperactivity disorder, predominantly inattentive type: Secondary | ICD-10-CM | POA: Diagnosis not present

## 2020-02-09 NOTE — Telephone Encounter (Signed)
Last Visit: 01/27/20 Next Visit: 06/22/20 Labs: 12/17/19 Glucose is elevated-118. Rest of CMP WNL. Anemia is stable Eye exam: 12/03/2019 WNL   Okay to refill per Dr. Corliss Skains

## 2020-02-10 ENCOUNTER — Telehealth: Payer: Self-pay | Admitting: Specialist

## 2020-02-10 NOTE — Telephone Encounter (Signed)
The pharmacy is requesting a 90 Day supply for the gabapentin for this patient

## 2020-02-10 NOTE — Telephone Encounter (Signed)
[facility] called. They need a RX for 90 supply for neurontin.

## 2020-02-11 ENCOUNTER — Other Ambulatory Visit: Payer: Self-pay | Admitting: Specialist

## 2020-02-16 DIAGNOSIS — F900 Attention-deficit hyperactivity disorder, predominantly inattentive type: Secondary | ICD-10-CM | POA: Diagnosis not present

## 2020-02-23 DIAGNOSIS — F900 Attention-deficit hyperactivity disorder, predominantly inattentive type: Secondary | ICD-10-CM | POA: Diagnosis not present

## 2020-03-01 DIAGNOSIS — F900 Attention-deficit hyperactivity disorder, predominantly inattentive type: Secondary | ICD-10-CM | POA: Diagnosis not present

## 2020-03-08 DIAGNOSIS — F900 Attention-deficit hyperactivity disorder, predominantly inattentive type: Secondary | ICD-10-CM | POA: Diagnosis not present

## 2020-03-10 ENCOUNTER — Telehealth: Payer: Self-pay | Admitting: Rheumatology

## 2020-03-10 ENCOUNTER — Other Ambulatory Visit: Payer: Self-pay

## 2020-03-10 DIAGNOSIS — Z79899 Other long term (current) drug therapy: Secondary | ICD-10-CM

## 2020-03-10 DIAGNOSIS — R748 Abnormal levels of other serum enzymes: Secondary | ICD-10-CM

## 2020-03-10 NOTE — Telephone Encounter (Signed)
Patient advised he can have up to 2 glasses of wine per week.  We will continue to monitor his lab work closely.

## 2020-03-10 NOTE — Telephone Encounter (Signed)
He can have up to 2 glasses of wine per week.  We will continue to monitor his lab work closely.

## 2020-03-10 NOTE — Telephone Encounter (Signed)
Patient states he is scheduled for his 2nd COVID vaccine on Tuesday, 03/15/20 and is not sure if he should take his Methotrexate injection this afternoon.  Patient is also asking if it is okay to drink alcohol while taking the Methotrexate.

## 2020-03-10 NOTE — Telephone Encounter (Signed)
Patient advised to hold his Methotrexate for 1 week after his Covid vaccine. Patient states his wife would like to know if he can have a glass of wine with her and how often as he is on MTX. Please advise.

## 2020-03-11 LAB — CBC WITH DIFFERENTIAL/PLATELET
Absolute Monocytes: 626 cells/uL (ref 200–950)
Basophils Absolute: 41 cells/uL (ref 0–200)
Basophils Relative: 0.7 %
Eosinophils Absolute: 261 cells/uL (ref 15–500)
Eosinophils Relative: 4.5 %
HCT: 37.8 % — ABNORMAL LOW (ref 38.5–50.0)
Hemoglobin: 12.7 g/dL — ABNORMAL LOW (ref 13.2–17.1)
Lymphs Abs: 1166 cells/uL (ref 850–3900)
MCH: 31.7 pg (ref 27.0–33.0)
MCHC: 33.6 g/dL (ref 32.0–36.0)
MCV: 94.3 fL (ref 80.0–100.0)
MPV: 9.5 fL (ref 7.5–12.5)
Monocytes Relative: 10.8 %
Neutro Abs: 3706 cells/uL (ref 1500–7800)
Neutrophils Relative %: 63.9 %
Platelets: 289 10*3/uL (ref 140–400)
RBC: 4.01 10*6/uL — ABNORMAL LOW (ref 4.20–5.80)
RDW: 12.7 % (ref 11.0–15.0)
Total Lymphocyte: 20.1 %
WBC: 5.8 10*3/uL (ref 3.8–10.8)

## 2020-03-11 LAB — COMPLETE METABOLIC PANEL WITH GFR
AG Ratio: 1.5 (calc) (ref 1.0–2.5)
ALT: 32 U/L (ref 9–46)
AST: 28 U/L (ref 10–35)
Albumin: 3.9 g/dL (ref 3.6–5.1)
Alkaline phosphatase (APISO): 85 U/L (ref 35–144)
BUN: 24 mg/dL (ref 7–25)
CO2: 28 mmol/L (ref 20–32)
Calcium: 9.6 mg/dL (ref 8.6–10.3)
Chloride: 102 mmol/L (ref 98–110)
Creat: 0.98 mg/dL (ref 0.70–1.25)
GFR, Est African American: 96 mL/min/{1.73_m2} (ref >=60)
GFR, Est Non African American: 83 mL/min/{1.73_m2} (ref >=60)
Globulin: 2.6 g/dL (calc) (ref 1.9–3.7)
Glucose, Bld: 141 mg/dL — ABNORMAL HIGH (ref 65–99)
Potassium: 4.5 mmol/L (ref 3.5–5.3)
Sodium: 136 mmol/L (ref 135–146)
Total Bilirubin: 0.8 mg/dL (ref 0.2–1.2)
Total Protein: 6.5 g/dL (ref 6.1–8.1)

## 2020-03-11 LAB — CK: Total CK: 263 U/L — ABNORMAL HIGH (ref 44–196)

## 2020-03-22 DIAGNOSIS — F900 Attention-deficit hyperactivity disorder, predominantly inattentive type: Secondary | ICD-10-CM | POA: Diagnosis not present

## 2020-04-05 DIAGNOSIS — F900 Attention-deficit hyperactivity disorder, predominantly inattentive type: Secondary | ICD-10-CM | POA: Diagnosis not present

## 2020-04-14 ENCOUNTER — Encounter: Payer: Self-pay | Admitting: Rheumatology

## 2020-04-19 DIAGNOSIS — F900 Attention-deficit hyperactivity disorder, predominantly inattentive type: Secondary | ICD-10-CM | POA: Diagnosis not present

## 2020-04-26 DIAGNOSIS — F900 Attention-deficit hyperactivity disorder, predominantly inattentive type: Secondary | ICD-10-CM | POA: Diagnosis not present

## 2020-04-26 DIAGNOSIS — F410 Panic disorder [episodic paroxysmal anxiety] without agoraphobia: Secondary | ICD-10-CM | POA: Diagnosis not present

## 2020-04-26 DIAGNOSIS — F3342 Major depressive disorder, recurrent, in full remission: Secondary | ICD-10-CM | POA: Diagnosis not present

## 2020-04-26 DIAGNOSIS — F4322 Adjustment disorder with anxiety: Secondary | ICD-10-CM | POA: Diagnosis not present

## 2020-04-27 DIAGNOSIS — F900 Attention-deficit hyperactivity disorder, predominantly inattentive type: Secondary | ICD-10-CM | POA: Diagnosis not present

## 2020-04-29 ENCOUNTER — Other Ambulatory Visit: Payer: Self-pay | Admitting: Physician Assistant

## 2020-04-29 ENCOUNTER — Other Ambulatory Visit: Payer: Self-pay | Admitting: Rheumatology

## 2020-04-29 NOTE — Telephone Encounter (Addendum)
Last Visit: 01/27/20 Next Visit: 06/22/20 Labs: 03/10/2020 Glucose elevated-141. Rest of CMP WNL. RBC count, Hgb, and Hct remain low but are trending up. Rest of CBC WNL. Eye exam: 12/03/2019 WNL   Current Dose per office note on 01/27/2020: PLQ 200 mg 1 tablet BID  Okay to refill per Dr. Corliss Skains

## 2020-04-29 NOTE — Telephone Encounter (Signed)
Last Visit:01/27/20 Next Visit:06/22/20 Labs:03/10/2020 Glucose elevated-141. Rest of CMP WNL. RBC count, Hgb, and Hct remain low but are trending up. Rest of CBC WNL.  Current Dose per office note on 01/27/2020: Methotrexate 8 tablets every 7 days   Okay to refill per Dr. Corliss Skains

## 2020-05-08 ENCOUNTER — Other Ambulatory Visit: Payer: Self-pay | Admitting: Specialist

## 2020-05-10 DIAGNOSIS — F900 Attention-deficit hyperactivity disorder, predominantly inattentive type: Secondary | ICD-10-CM | POA: Diagnosis not present

## 2020-05-27 ENCOUNTER — Encounter: Payer: Self-pay | Admitting: Specialist

## 2020-05-27 ENCOUNTER — Other Ambulatory Visit: Payer: Self-pay

## 2020-05-27 ENCOUNTER — Ambulatory Visit: Payer: BC Managed Care – PPO | Admitting: Specialist

## 2020-05-27 VITALS — BP 117/71 | HR 60 | Ht 72.0 in | Wt 190.0 lb

## 2020-05-27 DIAGNOSIS — M5136 Other intervertebral disc degeneration, lumbar region: Secondary | ICD-10-CM
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
## 2020-05-27 NOTE — Patient Instructions (Signed)
Avoid frequent bending and stooping  No lifting greater than 10 lbs. May use ice or moist heat for pain. Weight loss is of benefit. Best medication for lumbar disc disease is arthritis medications like motrin, celebrex and naprosyn but you are already on a fair amount so I recommend tramadol for the more severe pain. Continue with gabapentin. Exercise is important to improve your indurance and does allow people to function better inspite of back pain. Marland Kitchen Hemp CBD capsules, amazon.com 5,000-7,000 mg per bottle, 60 capsules per bottle, take one capsule twice a day. Cane in the left hand to use with left leg weight bearing. Follow-Up Instructions: No follow-ups on file.

## 2020-06-08 DIAGNOSIS — F900 Attention-deficit hyperactivity disorder, predominantly inattentive type: Secondary | ICD-10-CM | POA: Diagnosis not present
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
## 2020-06-09 ENCOUNTER — Other Ambulatory Visit: Payer: Self-pay

## 2020-06-09 DIAGNOSIS — Z79899 Other long term (current) drug therapy: Secondary | ICD-10-CM | POA: Diagnosis not present

## 2020-06-09 DIAGNOSIS — R748 Abnormal levels of other serum enzymes: Secondary | ICD-10-CM | POA: Diagnosis not present

## 2020-06-10 ENCOUNTER — Other Ambulatory Visit: Payer: Self-pay

## 2020-06-10 DIAGNOSIS — Z79899 Other long term (current) drug therapy: Secondary | ICD-10-CM

## 2020-06-10 LAB — COMPLETE METABOLIC PANEL WITH GFR
AG Ratio: 1.7 (calc) (ref 1.0–2.5)
ALT: 62 U/L — ABNORMAL HIGH (ref 9–46)
AST: 42 U/L — ABNORMAL HIGH (ref 10–35)
Albumin: 4.1 g/dL (ref 3.6–5.1)
Alkaline phosphatase (APISO): 72 U/L (ref 35–144)
BUN: 25 mg/dL (ref 7–25)
CO2: 28 mmol/L (ref 20–32)
Calcium: 9.3 mg/dL (ref 8.6–10.3)
Chloride: 103 mmol/L (ref 98–110)
Creat: 0.93 mg/dL (ref 0.70–1.25)
GFR, Est African American: 102 mL/min/{1.73_m2} (ref >=60)
GFR, Est Non African American: 88 mL/min/{1.73_m2} (ref >=60)
Globulin: 2.4 g/dL (calc) (ref 1.9–3.7)
Glucose, Bld: 103 mg/dL — ABNORMAL HIGH (ref 65–99)
Potassium: 5.1 mmol/L (ref 3.5–5.3)
Sodium: 137 mmol/L (ref 135–146)
Total Bilirubin: 1 mg/dL (ref 0.2–1.2)
Total Protein: 6.5 g/dL (ref 6.1–8.1)

## 2020-06-10 LAB — CBC WITH DIFFERENTIAL/PLATELET
Absolute Monocytes: 654 cells/uL (ref 200–950)
Basophils Absolute: 52 cells/uL (ref 0–200)
Basophils Relative: 1.2 %
Eosinophils Absolute: 172 cells/uL (ref 15–500)
Eosinophils Relative: 4 %
HCT: 38.8 % (ref 38.5–50.0)
Hemoglobin: 12.8 g/dL — ABNORMAL LOW (ref 13.2–17.1)
Lymphs Abs: 890 cells/uL (ref 850–3900)
MCH: 31.2 pg (ref 27.0–33.0)
MCHC: 33 g/dL (ref 32.0–36.0)
MCV: 94.6 fL (ref 80.0–100.0)
MPV: 9.9 fL (ref 7.5–12.5)
Monocytes Relative: 15.2 %
Neutro Abs: 2533 cells/uL (ref 1500–7800)
Neutrophils Relative %: 58.9 %
Platelets: 267 10*3/uL (ref 140–400)
RBC: 4.1 10*6/uL — ABNORMAL LOW (ref 4.20–5.80)
RDW: 13.3 % (ref 11.0–15.0)
Total Lymphocyte: 20.7 %
WBC: 4.3 10*3/uL (ref 3.8–10.8)

## 2020-06-10 LAB — CK: Total CK: 245 U/L — ABNORMAL HIGH (ref 44–196)

## 2020-06-10 MED ORDER — METHOTREXATE SODIUM 2.5 MG PO TABS: ORAL_TABLET | ORAL | 0 refills | Status: DC

## 2020-06-13 DIAGNOSIS — F419 Anxiety disorder, unspecified: Secondary | ICD-10-CM | POA: Diagnosis not present

## 2020-06-13 DIAGNOSIS — F900 Attention-deficit hyperactivity disorder, predominantly inattentive type: Secondary | ICD-10-CM | POA: Diagnosis not present

## 2020-06-13 DIAGNOSIS — Z79899 Other long term (current) drug therapy: Secondary | ICD-10-CM | POA: Diagnosis not present

## 2020-06-13 DIAGNOSIS — M79672 Pain in left foot: Secondary | ICD-10-CM | POA: Diagnosis not present

## 2020-06-14 ENCOUNTER — Other Ambulatory Visit: Payer: Self-pay

## 2020-06-14 ENCOUNTER — Telehealth: Payer: Self-pay | Admitting: Rheumatology

## 2020-06-14 ENCOUNTER — Encounter (HOSPITAL_COMMUNITY): Payer: Self-pay

## 2020-06-14 ENCOUNTER — Emergency Department (HOSPITAL_COMMUNITY): Payer: BC Managed Care – PPO

## 2020-06-14 ENCOUNTER — Emergency Department (HOSPITAL_COMMUNITY)
Admission: EM | Admit: 2020-06-14 | Discharge: 2020-06-14 | Disposition: A | Discharge status: Home / Self Care | Payer: BC Managed Care – PPO | Attending: Emergency Medicine | Admitting: Emergency Medicine

## 2020-06-14 DIAGNOSIS — R519 Headache, unspecified: Secondary | ICD-10-CM | POA: Insufficient documentation

## 2020-06-14 DIAGNOSIS — H53149 Visual discomfort, unspecified: Secondary | ICD-10-CM | POA: Diagnosis not present

## 2020-06-14 DIAGNOSIS — E039 Hypothyroidism, unspecified: Secondary | ICD-10-CM | POA: Diagnosis not present

## 2020-06-14 DIAGNOSIS — R110 Nausea: Secondary | ICD-10-CM | POA: Insufficient documentation

## 2020-06-14 DIAGNOSIS — F909 Attention-deficit hyperactivity disorder, unspecified type: Secondary | ICD-10-CM | POA: Insufficient documentation

## 2020-06-14 DIAGNOSIS — Z7982 Long term (current) use of aspirin: Secondary | ICD-10-CM | POA: Diagnosis not present

## 2020-06-14 DIAGNOSIS — Z79890 Hormone replacement therapy (postmenopausal): Secondary | ICD-10-CM | POA: Insufficient documentation

## 2020-06-14 DIAGNOSIS — Z87891 Personal history of nicotine dependence: Secondary | ICD-10-CM | POA: Insufficient documentation

## 2020-06-14 LAB — CBC WITH DIFFERENTIAL/PLATELET
Abs Immature Granulocytes: 0.01 10*3/uL (ref 0.00–0.07)
Basophils Absolute: 0.1 10*3/uL (ref 0.0–0.1)
Basophils Relative: 1 %
Eosinophils Absolute: 0.3 10*3/uL (ref 0.0–0.5)
Eosinophils Relative: 4 %
HCT: 40.6 % (ref 39.0–52.0)
Hemoglobin: 13.8 g/dL (ref 13.0–17.0)
Immature Granulocytes: 0 %
Lymphocytes Relative: 24 %
Lymphs Abs: 1.6 10*3/uL (ref 0.7–4.0)
MCH: 32 pg (ref 26.0–34.0)
MCHC: 34 g/dL (ref 30.0–36.0)
MCV: 94.2 fL (ref 80.0–100.0)
Monocytes Absolute: 0.7 10*3/uL (ref 0.1–1.0)
Monocytes Relative: 10 %
Neutro Abs: 4.1 10*3/uL (ref 1.7–7.7)
Neutrophils Relative %: 61 %
Platelets: 269 10*3/uL (ref 150–400)
RBC: 4.31 MIL/uL (ref 4.22–5.81)
RDW: 13.6 % (ref 11.5–15.5)
WBC: 6.7 10*3/uL (ref 4.0–10.5)
nRBC: 0 % (ref 0.0–0.2)

## 2020-06-14 LAB — BASIC METABOLIC PANEL
Anion gap: 9 (ref 5–15)
BUN: 18 mg/dL (ref 8–23)
CO2: 27 mmol/L (ref 22–32)
Calcium: 9.7 mg/dL (ref 8.9–10.3)
Chloride: 105 mmol/L (ref 98–111)
Creatinine, Ser: 0.88 mg/dL (ref 0.61–1.24)
GFR calc Af Amer: >60 mL/min (ref >60)
GFR calc non Af Amer: >60 mL/min (ref >60)
Glucose, Bld: 103 mg/dL — ABNORMAL HIGH (ref 70–99)
Potassium: 4.1 mmol/L (ref 3.5–5.1)
Sodium: 141 mmol/L (ref 135–145)

## 2020-06-14 MED ORDER — DEXAMETHASONE SODIUM PHOSPHATE 4 MG/ML IJ SOLN
4.0000 mg | Freq: Once | INTRAMUSCULAR | Status: AC
  Administered 2020-06-14: 4 mg via INTRAVENOUS
  Filled 2020-06-14: qty 1

## 2020-06-14 MED ORDER — DIPHENHYDRAMINE HCL 50 MG/ML IJ SOLN
50.0000 mg | Freq: Once | INTRAMUSCULAR | Status: AC
  Administered 2020-06-14: 50 mg via INTRAVENOUS
  Filled 2020-06-14: qty 1

## 2020-06-14 MED ORDER — KETOROLAC TROMETHAMINE 15 MG/ML IJ SOLN
15.0000 mg | Freq: Once | INTRAMUSCULAR | Status: AC
  Administered 2020-06-14: 15 mg via INTRAVENOUS
  Filled 2020-06-14: qty 1

## 2020-06-14 MED ORDER — SODIUM CHLORIDE 0.9 % IV BOLUS
1000.0000 mL | Freq: Once | INTRAVENOUS | Status: AC
  Administered 2020-06-14: 1000 mL via INTRAVENOUS

## 2020-06-14 MED ORDER — METOCLOPRAMIDE HCL 5 MG/ML IJ SOLN
10.0000 mg | Freq: Once | INTRAMUSCULAR | Status: AC
  Administered 2020-06-14: 10 mg via INTRAVENOUS
  Filled 2020-06-14: qty 2

## 2020-06-14 NOTE — Telephone Encounter (Signed)
Paul [name] called stating Paul [name] hasn't been feeling well since Sunday, 06/12/20.  Paul [name] states he has "sinus pressure in his head."  Paul [name] states he had appointment with PCP who is doing labwork to test for Gout due to Paul [name] stating it feels like he is stepping on a rock.  Paul [name] is requesting a return call to let her know if there is anything he can take and to discuss "the overall shape he is in."
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
## 2020-06-14 NOTE — ED Triage Notes (Signed)
Patient reports that he has had a headache x 2 days with nausea. Patient also c/o light sensitivity.

## 2020-06-14 NOTE — Telephone Encounter (Signed)
Returned call to [name], patient's wife. She states patient started having pain in his heel. Patient states it felt like he was stepping on a rock. Patient saw his PCP and PCP thinks it may be gout. Patient states he is having pain in temple which has been going on since Sunday evening. Patient is having pain behind the eyes and "pounding in his head." Patient is also having pressure behind his eyes. Patient has a follow up next week. Patient is feeling miserable. Patient is having on going joint pain. Patient is on PLQ and MTX as prescribed. Please advise.

## 2020-06-14 NOTE — Discharge Instructions (Addendum)
Please follow up with your PCP regarding your ED visit today Drink plenty of water to stay hydrated Return to the ED for any worsening symptoms including worsening pain, excessive vomiting, fevers > 100.4, neck stiffness, confusion, speech changes, one sided weakness/numbness, or any other new/concerning symptoms

## 2020-06-14 NOTE — Telephone Encounter (Signed)
Per Dr. Corliss Skains patient should be seen in the emergency room. Patient's wife advised he should be seen in the emergency room.

## 2020-06-15 ENCOUNTER — Telehealth: Payer: Self-pay | Admitting: *Deleted

## 2020-06-15 NOTE — Telephone Encounter (Signed)
Patient's wife [name] states she took [name] to the emergency room as recommended. She states Ulises had a CT Scan which was normal. She states he was advised by the emergency room doctor it was a migraine. Patient was given Toradol which relieved the headache.

## 2020-06-20 ENCOUNTER — Encounter: Payer: Self-pay | Admitting: Rheumatology

## 2020-06-21 DIAGNOSIS — F900 Attention-deficit hyperactivity disorder, predominantly inattentive type: Secondary | ICD-10-CM | POA: Diagnosis not present

## 2020-06-22 ENCOUNTER — Ambulatory Visit: Payer: Self-pay

## 2020-06-22 ENCOUNTER — Other Ambulatory Visit: Payer: Self-pay

## 2020-06-22 ENCOUNTER — Telehealth: Payer: Self-pay

## 2020-06-22 ENCOUNTER — Ambulatory Visit: Payer: BC Managed Care – PPO | Admitting: Rheumatology

## 2020-06-22 ENCOUNTER — Encounter: Payer: Self-pay | Admitting: Rheumatology

## 2020-06-22 VITALS — BP 116/72 | HR 71 | Resp 17 | Ht 72.0 in | Wt 188.6 lb

## 2020-06-22 DIAGNOSIS — Z8679 Personal history of other diseases of the circulatory system: Secondary | ICD-10-CM

## 2020-06-22 DIAGNOSIS — M542 Cervicalgia: Secondary | ICD-10-CM

## 2020-06-22 DIAGNOSIS — Z79899 Other long term (current) drug therapy: Secondary | ICD-10-CM

## 2020-06-22 DIAGNOSIS — M79672 Pain in left foot: Secondary | ICD-10-CM

## 2020-06-22 DIAGNOSIS — R7989 Other specified abnormal findings of blood chemistry: Secondary | ICD-10-CM

## 2020-06-22 DIAGNOSIS — M19041 Primary osteoarthritis, right hand: Secondary | ICD-10-CM | POA: Diagnosis not present

## 2020-06-22 DIAGNOSIS — M5030 Other cervical disc degeneration, unspecified cervical region: Secondary | ICD-10-CM

## 2020-06-22 DIAGNOSIS — M5136 Other intervertebral disc degeneration, lumbar region: Secondary | ICD-10-CM

## 2020-06-22 DIAGNOSIS — F32A Depression, unspecified: Secondary | ICD-10-CM

## 2020-06-22 DIAGNOSIS — M19042 Primary osteoarthritis, left hand: Secondary | ICD-10-CM

## 2020-06-22 DIAGNOSIS — Z8659 Personal history of other mental and behavioral disorders: Secondary | ICD-10-CM

## 2020-06-22 DIAGNOSIS — Z87438 Personal history of other diseases of male genital organs: Secondary | ICD-10-CM | POA: Diagnosis not present

## 2020-06-22 DIAGNOSIS — M19071 Primary osteoarthritis, right ankle and foot: Secondary | ICD-10-CM

## 2020-06-22 DIAGNOSIS — M0600 Rheumatoid arthritis without rheumatoid factor, unspecified site: Secondary | ICD-10-CM | POA: Diagnosis not present

## 2020-06-22 DIAGNOSIS — R5383 Other fatigue: Secondary | ICD-10-CM

## 2020-06-22 DIAGNOSIS — F329 Major depressive disorder, single episode, unspecified: Secondary | ICD-10-CM

## 2020-06-22 DIAGNOSIS — M79671 Pain in right foot: Secondary | ICD-10-CM

## 2020-06-22 DIAGNOSIS — F419 Anxiety disorder, unspecified: Secondary | ICD-10-CM

## 2020-06-22 DIAGNOSIS — Z8639 Personal history of other endocrine, nutritional and metabolic disease: Secondary | ICD-10-CM

## 2020-06-22 DIAGNOSIS — M19072 Primary osteoarthritis, left ankle and foot: Secondary | ICD-10-CM

## 2020-06-22 DIAGNOSIS — R748 Abnormal levels of other serum enzymes: Secondary | ICD-10-CM

## 2020-06-22 MED ORDER — PREDNISONE 5 MG PO TABS
ORAL_TABLET | ORAL | 0 refills | Status: DC
Start: 2020-06-22 — End: 2020-08-04

## 2020-06-22 NOTE — Telephone Encounter (Signed)
Please apply for enbrel, per Dr. Corliss Skains. Consent obtained and sent to the scan center. Thanks!

## 2020-06-22 NOTE — Patient Instructions (Signed)
Etanercept injection What is this medicine? ETANERCEPT (et a NER sept) is used for the treatment of rheumatoid arthritis in adults and children. The medicine is also used to treat psoriatic arthritis, ankylosing spondylitis, and psoriasis. This medicine may be used for other purposes; ask your health care provider or pharmacist if you have questions. COMMON BRAND NAME(S): Enbrel What should I tell my health care provider before I take this medicine? They need to know if you have any of these conditions:  blood disorders  cancer  congestive heart failure  diabetes  exposure to chickenpox  immune system problems  infection  multiple sclerosis  seizure disorder  tuberculosis, a positive skin test for tuberculosis or have recently been in close contact with someone who has tuberculosis  Wegener's granulomatosis  an unusual or allergic reaction to etanercept, latex, other medicines, foods, dyes, or preservatives  pregnant or trying to get pregnant  breast-feeding How should I use this medicine? The medicine is given by injection under the skin. You will be taught how to prepare and give this medicine. Use exactly as directed. Take your medicine at regular intervals. Do not take your medicine more often than directed. It is important that you put your used needles and syringes in a special sharps container. Do not put them in a trash can. If you do not have a sharps container, call your pharmacist or healthcare provider to get one. A special MedGuide will be given to you by the pharmacist with each prescription and refill. Be sure to read this information carefully each time. Talk to your pediatrician regarding the use of this medicine in children. While this drug may be prescribed for children as young as 4 years of age for selected conditions, precautions do apply. Overdosage: If you think you have taken too much of this medicine contact a poison control center or emergency room  at once. NOTE: This medicine is only for you. Do not share this medicine with others. What if I miss a dose? If you miss a dose, contact your health care professional to find out when you should take your next dose. Do not take double or extra doses without advice. What may interact with this medicine? Do not take this medicine with any of the following medications:  anakinra This medicine may also interact with the following medications:  cyclophosphamide  sulfasalazine  vaccines This list may not describe all possible interactions. Give your health care provider a list of all the medicines, herbs, non-prescription drugs, or dietary supplements you use. Also tell them if you smoke, drink alcohol, or use illegal drugs. Some items may interact with your medicine. What should I watch for while using this medicine? Tell your doctor or healthcare professional if your symptoms do not start to get better or if they get worse. You will be tested for tuberculosis (TB) before you start this medicine. If your doctor prescribes any medicine for TB, you should start taking the TB medicine before starting this medicine. Make sure to finish the full course of TB medicine. Call your doctor or health care professional for advice if you get a fever, chills or sore throat, or other symptoms of a cold or flu. Do not treat yourself. This drug decreases your body's ability to fight infections. Try to avoid being around people who are sick. What side effects may I notice from receiving this medicine? Side effects that you should report to your doctor or health care professional as soon as possible:  allergic   reactions like skin rash, itching or hives, swelling of the face, lips, or tongue  changes in vision  fever, chills or any other sign of infection  numbness or tingling in legs or other parts of the body  red, scaly patches or raised bumps on the skin  shortness of breath or difficulty  breathing  swollen lymph nodes in the neck, underarm, or groin areas  unexplained weight loss  unusual bleeding or bruising  unusual swelling or fluid retention in the legs  unusually weak or tired Side effects that usually do not require medical attention (report to your doctor or health care professional if they continue or are bothersome):  dizziness  headache  nausea  redness, itching, or swelling at the injection site  vomiting This list may not describe all possible side effects. Call your doctor for medical advice about side effects. You may report side effects to FDA at 1-800-FDA-1088. Where should I keep my medicine? Keep out of the reach of children. Enbrel products: Store unopened Enbrel vials, cartridges, or pens in a refrigerator between 2 and 8 degrees C (36 and 46 degrees F). Do not freeze. Do not shake. Keep in the original container to protect from light. Throw away any unopened and unused medicine that has been stored in the refrigerator after the expiration date. If needed, you may store an Enbrel single-use vial, single-dose prefilled syringe, SureClick autoinjector, or Enbrel Mini cartridge at room temperature between 20 to 25 degrees C (68 to 77 degrees F) for up to 14 days. Protect from light, heat, and moisture. Do not shake. Once any of these items are stored at room temperature, do not place them back into the refrigerator. Throw the item away after 14 days, even if it still contains medicine. If you are using the Enbrel multi-dose vials, you will be instructed on how to dilute and store this medicine once it has been diluted. The Enbrel AutoTouch reusable autoinjector device should be stored at room temperature. Do NOT refrigerate the AutoTouch reusable autoinjector. Erelzi products: Store Erelzi prefilled syringes or Sensoready pen in the refrigerator between 2 and 8 degrees C (36 and 46 degrees F). Do not freeze. Do not shake. Keep in the original container  to protect from light. Throw away any unopened and unused medicine that has been stored in the refrigerator after the expiration date. If needed, you may store the Erelzi prefilled syringe or pen at room temperature between 20 to 25 degrees C (68 to 77 degrees F) for up to 28 days. Protect from light, heat, and moisture. Do not shake. Once any of these items are stored at room temperature, do not place them back into the refrigerator. Throw the item away after 28 days, even if it still contains medicine. NOTE: This sheet is a summary. It may not cover all possible information. If you have questions about this medicine, talk to your doctor, pharmacist, or health care provider.  2020 Elsevier/Gold Standard (2019-02-16 09:49:58)  

## 2020-06-23 ENCOUNTER — Telehealth: Payer: Self-pay | Admitting: Rheumatology

## 2020-06-23 NOTE — Progress Notes (Signed)
Uric acid is normal.  TSH is low.  Sedimentation is high which indicates flare of rheumatoid arthritis.  Vitamin D is normal and LFTs are normal.  Several results are still pending.  Please forward results to his PCP.

## 2020-06-23 NOTE — Telephone Encounter (Signed)
Submitted a Prior Authorization request to [employer] for ENBREL via Cover My Meds. Will update once we receive a response.   (Key: [id number])

## 2020-06-23 NOTE — Telephone Encounter (Signed)
MRI request does not meet medical necessity. Doctor will need to call [phone] for peer to peer.

## 2020-06-25 LAB — HEPATITIS B SURFACE ANTIGEN: Hepatitis B Surface Ag: NONREACTIVE

## 2020-06-25 LAB — EPSTEIN-BARR VIRUS VCA ANTIBODY PANEL
EBV NA IgG: 83.5 U/mL — ABNORMAL HIGH
EBV VCA IgG: >750 U/mL — ABNORMAL HIGH
EBV VCA IgM: <36 U/mL

## 2020-06-25 LAB — SEDIMENTATION RATE: Sed Rate: 51 mm/h — ABNORMAL HIGH (ref 0–20)

## 2020-06-25 LAB — HEPATITIS C ANTIBODY
Hepatitis C Ab: NONREACTIVE
SIGNAL TO CUT-OFF: 0.01 (ref <1.00)

## 2020-06-25 LAB — HEPATIC FUNCTION PANEL
AG Ratio: 1.5 (calc) (ref 1.0–2.5)
ALT: 37 U/L (ref 9–46)
AST: 33 U/L (ref 10–35)
Albumin: 4.2 g/dL (ref 3.6–5.1)
Alkaline phosphatase (APISO): 81 U/L (ref 35–144)
Bilirubin, Direct: 0.2 mg/dL (ref 0.0–0.2)
Globulin: 2.8 g/dL (calc) (ref 1.9–3.7)
Indirect Bilirubin: 0.5 mg/dL (calc) (ref 0.2–1.2)
Total Bilirubin: 0.7 mg/dL (ref 0.2–1.2)
Total Protein: 7 g/dL (ref 6.1–8.1)

## 2020-06-25 LAB — PROTEIN ELECTROPHORESIS, SERUM, WITH REFLEX
Albumin ELP: 3.9 g/dL (ref 3.8–4.8)
Alpha 1: 0.4 g/dL — ABNORMAL HIGH (ref 0.2–0.3)
Alpha 2: 0.9 g/dL (ref 0.5–0.9)
Beta 2: 0.4 g/dL (ref 0.2–0.5)
Beta Globulin: 0.5 g/dL (ref 0.4–0.6)
Gamma Globulin: 1 g/dL (ref 0.8–1.7)
Total Protein: 7 g/dL (ref 6.1–8.1)

## 2020-06-25 LAB — HEPATITIS A ANTIBODY, IGM: Hep A IgM: NONREACTIVE

## 2020-06-25 LAB — QUANTIFERON-TB GOLD PLUS
Mitogen-NIL: 7.73 IU/mL
NIL: 0.04 IU/mL
QuantiFERON-TB Gold Plus: NEGATIVE
TB1-NIL: <0 IU/mL
TB2-NIL: <0 IU/mL

## 2020-06-25 LAB — VITAMIN D 25 HYDROXY (VIT D DEFICIENCY, FRACTURES): Vit D, 25-Hydroxy: 60 ng/mL (ref 30–100)

## 2020-06-25 LAB — TSH: TSH: 0.12 mIU/L — ABNORMAL LOW (ref 0.40–4.50)

## 2020-06-25 LAB — IGG, IGA, IGM
IgG (Immunoglobin G), Serum: 1100 mg/dL (ref 600–1540)
IgM, Serum: 39 mg/dL — ABNORMAL LOW (ref 50–300)
Immunoglobulin A: 209 mg/dL (ref 70–320)

## 2020-06-25 LAB — URIC ACID: Uric Acid, Serum: 4.4 mg/dL (ref 4.0–8.0)

## 2020-06-25 LAB — HEPATITIS B CORE ANTIBODY, IGM: Hep B C IgM: NONREACTIVE

## 2020-06-25 LAB — HIV ANTIBODY (ROUTINE TESTING W REFLEX): HIV 1&2 Ab, 4th Generation: NONREACTIVE

## 2020-06-26 NOTE — Progress Notes (Signed)
Sed rate is elevated.  He was having a flare of rheumatoid arthritis.  We discussed restarting on Enbrel once labs are available.  Please is schedule to start on Enbrel when approved by insurance.

## 2020-06-27 ENCOUNTER — Telehealth: Payer: Self-pay | Admitting: Rheumatology

## 2020-06-27 NOTE — Telephone Encounter (Signed)
Received notification from [facility] regarding a prior authorization for ENBREL. Authorization has been APPROVED from 06/23/20 to 06/22/21.   Authorization # [id number]  Ran test claim, patient's copay for 1 month is $200.00. Patient can enroll in Enbrel copay card.

## 2020-06-27 NOTE — Telephone Encounter (Signed)
All labs resulted and within normal limits to proceed with Enbrel.  Also, approved through insurance.  Please schedule patient for new start visit on nurse schedule under pharmacy clinic.  Thanks!

## 2020-06-27 NOTE — Telephone Encounter (Signed)
Patient's wife [name] called requesting a return call to discuss the Epstein-Barr labwork results.

## 2020-06-28 NOTE — Telephone Encounter (Signed)
I called patient's wife fully and discussed the EBV titers with her.  She states that patient is feeling better on prednisone.  They will be coming on Thursday to start on biologic DMARDs.

## 2020-06-28 NOTE — Telephone Encounter (Signed)
EBV titers show past infection which could have been several years ago does not show recent infection.

## 2020-06-28 NOTE — Telephone Encounter (Signed)
Patient's wife advised EBV titers show past infection which could have been several years ago does not show recent infection.  Leah asked if the virus will stay in his body and will it flare up again? PLease advise.

## 2020-06-28 NOTE — Telephone Encounter (Signed)
Please fax medical records

## 2020-06-29 DIAGNOSIS — E785 Hyperlipidemia, unspecified: Secondary | ICD-10-CM | POA: Diagnosis not present

## 2020-06-29 DIAGNOSIS — R7301 Impaired fasting glucose: Secondary | ICD-10-CM | POA: Diagnosis not present

## 2020-06-29 DIAGNOSIS — Z125 Encounter for screening for malignant neoplasm of prostate: Secondary | ICD-10-CM | POA: Diagnosis not present

## 2020-06-29 DIAGNOSIS — Z0000 Encounter for general adult medical examination without abnormal findings: Secondary | ICD-10-CM | POA: Diagnosis not present
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
## 2020-06-29 NOTE — Telephone Encounter (Signed)
Clinicals faxed 06/28/2020. 

## 2020-06-30 ENCOUNTER — Other Ambulatory Visit: Payer: Self-pay

## 2020-06-30 ENCOUNTER — Ambulatory Visit (INDEPENDENT_AMBULATORY_CARE_PROVIDER_SITE_OTHER): Payer: BC Managed Care – PPO | Admitting: Pharmacist

## 2020-06-30 ENCOUNTER — Other Ambulatory Visit: Payer: Self-pay | Admitting: Pharmacist

## 2020-06-30 VITALS — BP 102/50 | HR 59

## 2020-06-30 DIAGNOSIS — M0600 Rheumatoid arthritis without rheumatoid factor, unspecified site: Secondary | ICD-10-CM | POA: Diagnosis not present

## 2020-06-30 DIAGNOSIS — Z8349 Family history of other endocrine, nutritional and metabolic diseases: Secondary | ICD-10-CM | POA: Diagnosis not present

## 2020-06-30 DIAGNOSIS — E039 Hypothyroidism, unspecified: Secondary | ICD-10-CM | POA: Diagnosis not present

## 2020-06-30 MED ORDER — ENBREL MINI 50 MG/ML ~~LOC~~ SOCT: 50.0000 mg | SUBCUTANEOUS | 0 refills | Status: DC

## 2020-06-30 MED ORDER — ENBREL MINI 50 MG/ML ~~LOC~~ SOCT
50.0000 mg | SUBCUTANEOUS | 0 refills | Status: DC
Start: 2020-06-30 — End: 2020-09-28

## 2020-06-30 NOTE — Patient Instructions (Signed)
Thank you for meeting with the pharmacy team today!  Below find a summary of what we discussed at your visit: . START Enbrel every 7 days (next dose will be on  8/5) . Enroll in co-pay card with Enbrel Support Remember the 5 C's: COUNTER- leave on the counter at least 30 mins but up to overnight to bring medication to room temperature and prevent stinging COLD- Placing something cold (like and ice gel pack or cold water bottle) on the injection site just before cleansing with alcohol may help reduce pain CLARITIN- for the first two weeks of treatment or  the day of, the day before, and the day after injecting to minimize injection site reactions CORTISONE CREAM- apply if injection site is irritated and itching CALL ME- if injection site reaction is bigger than the size of your fist, looks infected, blisters, or develop hives

## 2020-06-30 NOTE — Progress Notes (Signed)
Pharmacy Note  Subjective:   Patient presents to clinic today to receive first dose of Enbrel.  Patient running a fever or have signs/symptoms of infection? No  Patient currently on antibiotics for the treatment of infection? No  Patient have any upcoming invasive procedures/surgeries? No  Objective: CMP     Component Value Date/Time   NA 141 06/14/2020 2055   K 4.1 06/14/2020 2055   CL 105 06/14/2020 2055   CO2 27 06/14/2020 2055   GLUCOSE 103 (H) 06/14/2020 2055   BUN 18 06/14/2020 2055   CREATININE 0.88 06/14/2020 2055   CREATININE 0.93 06/09/2020 0943   CALCIUM 9.7 06/14/2020 2055   PROT 7.0 06/22/2020 1614   PROT 7.0 06/22/2020 1614   PROT 6.7 11/12/2018 0826   ALBUMIN 4.3 11/12/2018 0826   AST 33 06/22/2020 1614   ALT 37 06/22/2020 1614   ALKPHOS 68 11/12/2018 0826   BILITOT 0.7 06/22/2020 1614   BILITOT 0.9 11/12/2018 0826   GFRNONAA >60 06/14/2020 2055   GFRNONAA 88 06/09/2020 0943   GFRAA >60 06/14/2020 2055   GFRAA 102 06/09/2020 0943    CBC    Component Value Date/Time   WBC 6.7 06/14/2020 2055   RBC 4.31 06/14/2020 2055   HGB 13.8 06/14/2020 2055   HCT 40.6 06/14/2020 2055   PLT 269 06/14/2020 2055   MCV 94.2 06/14/2020 2055   MCH 32.0 06/14/2020 2055   MCHC 34.0 06/14/2020 2055   RDW 13.6 06/14/2020 2055   LYMPHSABS 1.6 06/14/2020 2055   MONOABS 0.7 06/14/2020 2055   EOSABS 0.3 06/14/2020 2055   BASOSABS 0.1 06/14/2020 2055    Baseline Immunosuppressant Therapy Labs TB GOLD Quantiferon TB Gold Latest Ref Rng & Units 06/22/2020  Quantiferon TB Gold Plus NEGATIVE NEGATIVE   Hepatitis Panel Hepatitis Latest Ref Rng & Units 06/22/2020  Hep B Surface Ag NON-REACTI NON-REACTIVE  Hep B IgM NON-REACTI NON-REACTIVE  Hep C Ab NON-REACTI NON-REACTIVE  Hep C Ab NON-REACTI NON-REACTIVE  Hep A IgM NON-REACTI NON-REACTIVE   HIV Lab Results  Component Value Date   HIV NON-REACTIVE 06/22/2020   HIV Non Reactive 10/19/2016    Immunoglobulins Immunoglobulin Electrophoresis Latest Ref Rng & Units 06/22/2020  IgA  70 - 320 mg/dL 696  IgG 295 - 2,841 mg/dL 3,244  IgM 50 - 010 mg/dL 27(O)   SPEP Serum Protein Electrophoresis Latest Ref Rng & Units 06/22/2020  Total Protein 6.1 - 8.1 g/dL 7.0  Albumin 3.8 - 4.8 g/dL -  Alpha-1 0.2 - 0.3 g/dL -  Alpha-2 0.5 - 0.9 g/dL -  Beta Globulin 0.4 - 0.6 g/dL -  Beta 2 0.2 - 0.5 g/dL -  Gamma Globulin 0.8 - 1.7 g/dL -   Z3GU No results found for: G6PDH TPMT Lab Results  Component Value Date   TPMT 13 09/02/2018     Chest x-ray: negative for pulmonary nodule. No acute disease. 09/02/2018  Assessment/Plan:   Demonstrated proper injection technique with Enbrel Mini demo pen.  Patient able to demonstrate proper injection technique using the teach back method.  Patient self injected in the left lower abdomen with:  Sample Medication: Enbrel Mini 50mg /ml NDC: Lot: [id number] Expiration: 03/2022  Patient tolerated well.  Observed for 30 mins in office for adverse reaction and without issue.   Patient is to return in 1 month for follow up appointment and for labs.  Standing orders placed.  Enbrel approved through insurance with $200 monthly co-pay.  Given information on how to enroll in  co-pay card. Prescription sent to [facility].  Patient given sample with same lot and expiration as above in case of shipping delays.  He is to continue methotrexate and finish prednisone taper.  Patient verbalized understanding  All questions encouraged and answered.  Instructed  patient to call with any further questions or concerns.   Verlin Fester, PharmD, [place], CPP Clinical Specialty Pharmacist (Rheumatology and Pulmonology)  06/30/2020 11:40 AM

## 2020-07-05 ENCOUNTER — Telehealth: Payer: Self-pay | Admitting: Rheumatology

## 2020-07-05 NOTE — Telephone Encounter (Signed)
Returned call and provided patient's wife with pharmacy phone number to provide copay card.[phone]

## 2020-07-05 NOTE — Telephone Encounter (Signed)
Patient's wife requesting call back in reference to patient's copay card he received. Wife doesn't know if you need card info to put on file here, or not. Please call to advise.

## 2020-07-07 DIAGNOSIS — F900 Attention-deficit hyperactivity disorder, predominantly inattentive type: Secondary | ICD-10-CM | POA: Diagnosis not present

## 2020-07-11 ENCOUNTER — Telehealth: Payer: Self-pay | Admitting: Specialist

## 2020-07-11 MED FILL — ENBREL MINI 50 MG/ML SOCT: 50 | 28 days supply | Qty: 4 | Fill #0

## 2020-07-11 NOTE — Telephone Encounter (Signed)
Patient's wife [name] called asked if patient can be added to the wait list for an earlier appointment for MRI review. The number to contact [name] is [phone]

## 2020-07-11 NOTE — Telephone Encounter (Signed)
Put him on the cancellation list 

## 2020-07-12 ENCOUNTER — Ambulatory Visit: Payer: BC Managed Care – PPO | Admitting: Rheumatology

## 2020-07-14 ENCOUNTER — Encounter: Payer: Self-pay | Admitting: Rheumatology

## 2020-07-14 DIAGNOSIS — M542 Cervicalgia: Secondary | ICD-10-CM | POA: Diagnosis not present

## 2020-07-16 ENCOUNTER — Encounter: Payer: Self-pay | Admitting: Rheumatology

## 2020-07-19 ENCOUNTER — Encounter: Payer: Self-pay | Admitting: Specialist

## 2020-07-19 ENCOUNTER — Ambulatory Visit: Payer: BC Managed Care – PPO | Admitting: Specialist

## 2020-07-19 ENCOUNTER — Other Ambulatory Visit: Payer: Self-pay

## 2020-07-19 VITALS — BP 108/72 | HR 60 | Ht 72.0 in | Wt 189.0 lb

## 2020-07-19 DIAGNOSIS — M47812 Spondylosis without myelopathy or radiculopathy, cervical region: Secondary | ICD-10-CM | POA: Diagnosis not present

## 2020-07-19 DIAGNOSIS — M5030 Other cervical disc degeneration, unspecified cervical region: Secondary | ICD-10-CM
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
## 2020-07-20 ENCOUNTER — Telehealth: Payer: Self-pay | Admitting: *Deleted

## 2020-07-20 ENCOUNTER — Encounter: Payer: Self-pay | Admitting: *Deleted

## 2020-07-20 NOTE — Telephone Encounter (Signed)
Contacted patient and spoke with patient's wife who is on dpr. Advised we have received his MRI results. She states they saw Dr. Otelia Sergeant who reviewed the results with them. Advised per Dr. Corliss Skains it did show some inflammation which should resolve with the Enbrel that he has recently started. She asked about the Covid booster and information was sent via my chart.

## 2020-07-21 ENCOUNTER — Other Ambulatory Visit: Payer: Self-pay | Admitting: Specialist

## 2020-07-21 ENCOUNTER — Other Ambulatory Visit: Payer: Self-pay | Admitting: Rheumatology

## 2020-07-21 NOTE — Telephone Encounter (Signed)
Last Visit: 06/22/2020 Next Visit: 08/04/2020 Labs: 06/14/2020 Glucose 103 Eye exam:  12/03/2019 WNL  Current Dose per office note 06/22/2020: PLQ 200 mg 1 tablet BID, Methotrexate 8 tablets every 7 days and folic acid 1 mg 2 tablets daily. DX: Seronegative rheumatoid arthritis   Patient was started on Enbrel on 06/30/2020.   Okay to refill Plaquenil, MTX and Folic Acid?

## 2020-07-21 NOTE — Telephone Encounter (Signed)
Please advise 

## 2020-07-22 ENCOUNTER — Telehealth: Payer: Self-pay | Admitting: Rheumatology
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
## 2020-07-22 NOTE — Telephone Encounter (Signed)
I called to check on Tyrez.  I had conversation with his wife.  She states he is off prednisone and continues to do well.  He is currently on Enbrel 50 mg subcu once a week.  He is also taking methotrexate 7 tablets p.o. weekly.  Have advised him to reduce methotrexate to 6 tablets p.o. weekly.  He may discontinue Plaquenil.  He will have labs in couple of weeks.  He has appointment coming up soon for evaluation. Pollyann Savoy, MD

## 2020-07-27 DIAGNOSIS — F900 Attention-deficit hyperactivity disorder, predominantly inattentive type: Secondary | ICD-10-CM | POA: Diagnosis not present

## 2020-07-27 DIAGNOSIS — F3342 Major depressive disorder, recurrent, in full remission: Secondary | ICD-10-CM | POA: Diagnosis not present

## 2020-07-28 ENCOUNTER — Encounter: Payer: Self-pay | Admitting: Rheumatology

## 2020-08-04 ENCOUNTER — Encounter: Payer: Self-pay | Admitting: Rheumatology

## 2020-08-04 ENCOUNTER — Other Ambulatory Visit: Payer: Self-pay

## 2020-08-04 ENCOUNTER — Telehealth: Payer: Self-pay | Admitting: Rheumatology

## 2020-08-04 ENCOUNTER — Ambulatory Visit: Payer: BC Managed Care – PPO | Admitting: Rheumatology

## 2020-08-04 VITALS — BP 109/69 | HR 66 | Resp 15 | Ht 72.0 in | Wt 189.0 lb

## 2020-08-04 DIAGNOSIS — Z8659 Personal history of other mental and behavioral disorders: Secondary | ICD-10-CM

## 2020-08-04 DIAGNOSIS — M542 Cervicalgia: Secondary | ICD-10-CM

## 2020-08-04 DIAGNOSIS — F329 Major depressive disorder, single episode, unspecified: Secondary | ICD-10-CM

## 2020-08-04 DIAGNOSIS — F419 Anxiety disorder, unspecified: Secondary | ICD-10-CM

## 2020-08-04 DIAGNOSIS — M79671 Pain in right foot: Secondary | ICD-10-CM

## 2020-08-04 DIAGNOSIS — R748 Abnormal levels of other serum enzymes: Secondary | ICD-10-CM

## 2020-08-04 DIAGNOSIS — Z79899 Other long term (current) drug therapy: Secondary | ICD-10-CM | POA: Diagnosis not present

## 2020-08-04 DIAGNOSIS — M5136 Other intervertebral disc degeneration, lumbar region: Secondary | ICD-10-CM

## 2020-08-04 DIAGNOSIS — M19072 Primary osteoarthritis, left ankle and foot: Secondary | ICD-10-CM

## 2020-08-04 DIAGNOSIS — M19071 Primary osteoarthritis, right ankle and foot: Secondary | ICD-10-CM

## 2020-08-04 DIAGNOSIS — Z87438 Personal history of other diseases of male genital organs: Secondary | ICD-10-CM

## 2020-08-04 DIAGNOSIS — F32A Depression, unspecified: Secondary | ICD-10-CM

## 2020-08-04 DIAGNOSIS — Z8679 Personal history of other diseases of the circulatory system: Secondary | ICD-10-CM

## 2020-08-04 DIAGNOSIS — R5383 Other fatigue: Secondary | ICD-10-CM | POA: Diagnosis not present

## 2020-08-04 DIAGNOSIS — M19041 Primary osteoarthritis, right hand: Secondary | ICD-10-CM

## 2020-08-04 DIAGNOSIS — M0600 Rheumatoid arthritis without rheumatoid factor, unspecified site: Secondary | ICD-10-CM

## 2020-08-04 DIAGNOSIS — M19042 Primary osteoarthritis, left hand: Secondary | ICD-10-CM

## 2020-08-04 DIAGNOSIS — Z8639 Personal history of other endocrine, nutritional and metabolic disease: Secondary | ICD-10-CM

## 2020-08-04 MED FILL — ENBREL MINI 50 MG/ML SOCT: 50 | 28 days supply | Qty: 4 | Fill #1

## 2020-08-04 NOTE — Telephone Encounter (Signed)
Patient's wife [name] left a voicemail requesting a return call to clarify the quantity of Methotrexate.

## 2020-08-04 NOTE — Telephone Encounter (Signed)
Patient's wife [name], called to clarify if Austen is to reduce MTX to 7 tabs or 6 tabs. [name] states you called to discuss with her on 07/22/2020 and advised He is currently on Enbrel 50 mg subcu once a week.  He is also taking methotrexate 7 tablets p.o. weekly.  Have advised him to reduce methotrexate to 6 tablets p.o. weekly.  He may discontinue Plaquenil.  Please advise at which dose of MTX he should be on .

## 2020-08-04 NOTE — Patient Instructions (Signed)
Standing Labs We placed an order today for your standing lab work.   Please have your standing labs drawn in 1 week and then every 3 months  If possible, please have your labs drawn 2 weeks prior to your appointment so that the provider can discuss your results at your appointment.  We have open lab daily Monday through Thursday from 8:30-12:30 PM and 1:30-4:30 PM and Friday from 8:30-12:30 PM and 1:30-4:00 PM at the office of Dr. Pollyann Savoy, [facility].   Please be advised, patients with office appointments requiring lab work will take precedents over walk-in lab work.  If possible, please come for your lab work on Monday and Friday afternoons, as you may experience shorter wait times. The office is located at [address], [place], [place] [address] No appointment is necessary.   Labs are drawn by [facility]. Please bring your co-pay at the time of your lab draw.  You may receive a bill from [facility] for your lab work.  If you wish to have your labs drawn at another location, please call the office 24 hours in advance to send orders.  If you have any questions regarding directions or hours of operation,  please call [phone].   As a reminder, please drink plenty of water prior to coming for your lab work. Thanks!

## 2020-08-04 NOTE — Telephone Encounter (Signed)
His labs are normal now.  He has been taking methotrexate 7 tablets p.o. weekly which he is tolerating well.  He should continue on methotrexate 7 tablets p.o. weekly.  He will be getting labs again soon.  If we have to adjust the dose we will notify him.

## 2020-08-05 NOTE — Telephone Encounter (Signed)
Advised patient's wife Paul [name], Paul [name]  labs are normal now.  He has been taking methotrexate 7 tablets p.o. weekly which he is tolerating well.  He should continue on methotrexate 7 tablets p.o. weekly.  He will be getting labs again soon.  If we have to adjust the dose we will notify him.

## 2020-08-11 DIAGNOSIS — F900 Attention-deficit hyperactivity disorder, predominantly inattentive type: Secondary | ICD-10-CM | POA: Diagnosis not present

## 2020-08-15 ENCOUNTER — Telehealth: Payer: Self-pay | Admitting: Rheumatology

## 2020-08-15 NOTE — Telephone Encounter (Signed)
Patient left a voicemail stating he tried to use his Autotouch injector but got a red signal.  Patient states he called Enbrel and they will be sending him another one.

## 2020-08-17 ENCOUNTER — Ambulatory Visit: Payer: BC Managed Care – PPO | Admitting: Specialist

## 2020-08-18 ENCOUNTER — Telehealth: Payer: Self-pay | Admitting: Rheumatology

## 2020-08-18 NOTE — Telephone Encounter (Signed)
Nikki from [employer] called

## 2020-08-18 NOTE — Telephone Encounter (Signed)
Received fax and documented in another encounter.
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
## 2020-08-22 ENCOUNTER — Other Ambulatory Visit: Payer: Self-pay

## 2020-08-22 ENCOUNTER — Encounter: Payer: Self-pay | Admitting: Registered"

## 2020-08-22 ENCOUNTER — Encounter: Payer: BC Managed Care – PPO | Attending: Internal Medicine | Admitting: Registered"

## 2020-08-22 DIAGNOSIS — R7303 Prediabetes: Secondary | ICD-10-CM | POA: Diagnosis not present

## 2020-08-22 NOTE — Progress Notes (Signed)
Diabetes Self-Management Education  Visit Type: First/Initial  Appt. Start Time: 1405 Appt. End Time: 1505  08/26/2020  [name], identified by name and date of birth, is a 64 y.o. male with a diagnosis of Diabetes: Pre-Diabetes.   This patient is accompanied in the office by his spouse.  ASSESSMENT  There were no vitals taken for this visit. There is no height or weight on file to calculate BMI.  Patient's spouse is very concerned because of patients other health issues and states his doctor said he doesn't want to have diabetes on top of it.  Patient states it is difficult to exercise due to foot pain from digenerative disks. Pt reports he could start using his elliptical because not as hard on his feet.  Patient states they have started paying more attention to the foods that have carbohydrates and try to have foods that contain less than 6 grams of sugar or less.  Sleep: disturbed due to pain average 6-7 hrs.   Diabetes Self-Management Education - 08/22/20 1417      Visit Information   Visit Type First/Initial      Initial Visit   Diabetes Type Pre-Diabetes    Are you currently following a meal plan? No    Are you taking your medications as prescribed? Not on Medications    Date Diagnosed pre-diabetes      Complications   Last HgB A1C per patient/outside source 6.2 %      Dietary Intake   Breakfast Greek yogurt, walnuts, blueberries, granola (protein bar, 1/2 banana on way to work)    Snack (morning) 20 almonds    Lunch ratt[place]ou [place] pepperoni, spinach salad, parmesean    Dinner gumbo, brown rice, meat; spinach salad    Snack (evening) keto ice cream bar, 5 nut clusters    Beverage(s) water 40 oz, diet green tea, 1 or 2 non-alcoholic beer 2x week, 1 glass of wine per week, decaf coffee with stevia      Exercise   Exercise Type Light (walking / raking leaves)    How many days per week to you exercise? 3    How many minutes per day do you exercise? 15     Total minutes per week of exercise 45      Patient Education   Previous Diabetes Education Yes (please comment)   NDES 2017   Nutrition management  Role of diet in the treatment of diabetes and the relationship between the three main macronutrients and blood glucose level;Food label reading, portion sizes and measuring food.    Chronic complications Relationship between chronic complications and blood glucose control      Individualized Goals (developed by patient)   Nutrition General guidelines for healthy choices and portions discussed    Physical Activity Exercise 3-5 times per week    Problem Solving --   stress     Outcomes   Expected Outcomes Demonstrated interest in learning. Expect positive outcomes    Future DMSE PRN    Program Status Completed           Individualized Plan for Diabetes Self-Management Training:   Learning Objective:  Patient will have a greater understanding of diabetes self-management. Patient education plan is to attend individual and/or group sessions per assessed needs and concerns.   Patient Instructions  Continue with plan to increase exercise that you are able to tolerate Aim to eat balanced meals and snacks using MyPlate as a guide.   Expected Outcomes:  Demonstrated interest  in learning. Expect positive outcomes  Education material provided: ADA - How to Thrive: A Guide for Your Journey with Diabetes  If problems or questions, patient to contact team via:  Phone and MyChart  Future DSME appointment: PRN

## 2020-08-24 ENCOUNTER — Encounter: Payer: Self-pay | Admitting: Rheumatology

## 2020-08-25 ENCOUNTER — Other Ambulatory Visit: Payer: Self-pay

## 2020-08-25 DIAGNOSIS — R748 Abnormal levels of other serum enzymes: Secondary | ICD-10-CM | POA: Diagnosis not present

## 2020-08-25 DIAGNOSIS — Z79899 Other long term (current) drug therapy: Secondary | ICD-10-CM | POA: Diagnosis not present

## 2020-08-25 DIAGNOSIS — Z8639 Personal history of other endocrine, nutritional and metabolic disease: Secondary | ICD-10-CM | POA: Diagnosis not present

## 2020-08-25 DIAGNOSIS — Z23 Encounter for immunization: Secondary | ICD-10-CM | POA: Diagnosis not present

## 2020-08-26 DIAGNOSIS — R7303 Prediabetes: Secondary | ICD-10-CM | POA: Insufficient documentation

## 2020-08-26 LAB — CBC WITH DIFFERENTIAL/PLATELET
Absolute Monocytes: 770 cells/uL (ref 200–950)
Basophils Absolute: 42 cells/uL (ref 0–200)
Basophils Relative: 0.8 %
Eosinophils Absolute: 198 cells/uL (ref 15–500)
Eosinophils Relative: 3.8 %
HCT: 38.6 % (ref 38.5–50.0)
Hemoglobin: 12.8 g/dL — ABNORMAL LOW (ref 13.2–17.1)
Lymphs Abs: 1217 cells/uL (ref 850–3900)
MCH: 31.5 pg (ref 27.0–33.0)
MCHC: 33.2 g/dL (ref 32.0–36.0)
MCV: 95.1 fL (ref 80.0–100.0)
MPV: 10.5 fL (ref 7.5–12.5)
Monocytes Relative: 14.8 %
Neutro Abs: 2974 cells/uL (ref 1500–7800)
Neutrophils Relative %: 57.2 %
Platelets: 247 10*3/uL (ref 140–400)
RBC: 4.06 10*6/uL — ABNORMAL LOW (ref 4.20–5.80)
RDW: 13.2 % (ref 11.0–15.0)
Total Lymphocyte: 23.4 %
WBC: 5.2 10*3/uL (ref 3.8–10.8)

## 2020-08-26 LAB — COMPLETE METABOLIC PANEL WITH GFR
AG Ratio: 1.9 (calc) (ref 1.0–2.5)
ALT: 40 U/L (ref 9–46)
AST: 38 U/L — ABNORMAL HIGH (ref 10–35)
Albumin: 4.3 g/dL (ref 3.6–5.1)
Alkaline phosphatase (APISO): 66 U/L (ref 35–144)
BUN: 20 mg/dL (ref 7–25)
CO2: 28 mmol/L (ref 20–32)
Calcium: 9.2 mg/dL (ref 8.6–10.3)
Chloride: 103 mmol/L (ref 98–110)
Creat: 0.98 mg/dL (ref 0.70–1.25)
GFR, Est African American: 95 mL/min/{1.73_m2} (ref >=60)
GFR, Est Non African American: 82 mL/min/{1.73_m2} (ref >=60)
Globulin: 2.3 g/dL (calc) (ref 1.9–3.7)
Glucose, Bld: 91 mg/dL (ref 65–99)
Potassium: 4.7 mmol/L (ref 3.5–5.3)
Sodium: 138 mmol/L (ref 135–146)
Total Bilirubin: 0.9 mg/dL (ref 0.2–1.2)
Total Protein: 6.6 g/dL (ref 6.1–8.1)

## 2020-08-26 LAB — TSH: TSH: 0.35 mIU/L — ABNORMAL LOW (ref 0.40–4.50)

## 2020-08-26 LAB — CK: Total CK: 476 U/L — ABNORMAL HIGH (ref 44–196)

## 2020-08-26 NOTE — Progress Notes (Signed)
CBC is normal, ALT is mildly elevated.  Which is most likely due to methotrexate and statin use.  We will continue to monitor labs.

## 2020-08-26 NOTE — Patient Instructions (Signed)
Continue with plan to increase exercise that you are able to tolerate Aim to eat balanced meals and snacks using MyPlate as a guide.

## 2020-08-26 NOTE — Progress Notes (Signed)
TSH is low.  CK is elevated.  We will repeat CK with the next labs.  Please forward labs to his PCP.

## 2020-09-05 MED FILL — ENBREL MINI 50 MG/ML SOCT: 50 | 28 days supply | Qty: 4 | Fill #2

## 2020-09-08 DIAGNOSIS — F900 Attention-deficit hyperactivity disorder, predominantly inattentive type: Secondary | ICD-10-CM | POA: Diagnosis not present

## 2020-09-13 DIAGNOSIS — F419 Anxiety disorder, unspecified: Secondary | ICD-10-CM | POA: Diagnosis not present

## 2020-09-13 DIAGNOSIS — F902 Attention-deficit hyperactivity disorder, combined type: Secondary | ICD-10-CM | POA: Diagnosis not present

## 2020-09-13 DIAGNOSIS — F900 Attention-deficit hyperactivity disorder, predominantly inattentive type: Secondary | ICD-10-CM | POA: Diagnosis not present

## 2020-09-13 DIAGNOSIS — Z79899 Other long term (current) drug therapy: Secondary | ICD-10-CM | POA: Diagnosis not present

## 2020-09-19 ENCOUNTER — Other Ambulatory Visit: Payer: Self-pay | Admitting: Specialist

## 2020-09-19 NOTE — Telephone Encounter (Signed)
Please advise. Thanks.  

## 2020-09-22 DIAGNOSIS — F900 Attention-deficit hyperactivity disorder, predominantly inattentive type: Secondary | ICD-10-CM | POA: Diagnosis not present

## 2020-09-23 ENCOUNTER — Telehealth: Payer: Self-pay | Admitting: Specialist

## 2020-09-23 ENCOUNTER — Other Ambulatory Visit: Payer: Self-pay

## 2020-09-23 MED ORDER — TRAMADOL HCL 50 MG PO TABS: 50.0000 mg | ORAL_TABLET | Freq: Four times a day (QID) | ORAL | 0 refills | Status: DC | PRN

## 2020-09-23 NOTE — Telephone Encounter (Signed)
Pt called stating he needs a refill of tramadol and would like a CB when that's been sent in; pt states he needs it today since he's going out of town   [phone]

## 2020-09-23 NOTE — Telephone Encounter (Signed)
Request has been sent to Dr. Chestine Spore to allow up to 48 hours for refills on medications

## 2020-09-23 NOTE — Addendum Note (Signed)
Addended by: Penne Lash, Neysa Bonito N on: 09/23/2020 11:27 AM   Modules accepted: Orders

## 2020-09-23 NOTE — Telephone Encounter (Signed)
I called and advised his meds were sent in

## 2020-09-23 NOTE — Telephone Encounter (Signed)
Patient's pharmacy called stating that patient would like a Rx refill on Tramadol.  Please advise.  Thank you.

## 2020-09-28 ENCOUNTER — Other Ambulatory Visit: Payer: Self-pay | Admitting: Pharmacist

## 2020-09-28 ENCOUNTER — Other Ambulatory Visit: Payer: Self-pay | Admitting: Physician Assistant

## 2020-09-28 NOTE — Telephone Encounter (Signed)
Is there a reason this patient cannot receive a 90-day supply of enbrel?

## 2020-09-28 NOTE — Telephone Encounter (Signed)
Last Visit: 08/04/2020 Next Visit: 11/08/2020 Labs: 08/25/2020 CBC is normal, ALT is mildly elevated TB Gold: 06/22/2020 Neg   Current Dose per office note 08/04/2020: enbrel 50 mg subcu once weekly  DX: Seronegative rheumatoid arthritis   Okay to refill Enbrel?

## 2020-09-28 NOTE — Telephone Encounter (Signed)
Routing to clinical staff.

## 2020-09-30 NOTE — Telephone Encounter (Signed)
Filled by dr Otelia Sergeant

## 2020-10-03 ENCOUNTER — Telehealth: Payer: Self-pay | Admitting: Rheumatology

## 2020-10-03 NOTE — Telephone Encounter (Signed)
Patient's wife calling in reference to patient.  #1. Patient is feeling cold a lot, and wife is wondering does this have anything to do with his Iron level? Is there anything that can be done to help this? #2. Patient bought tickets to the [facility] for this month a long time ago. Does doctor think it would be okay for patient to attend this being in a large crowd? Masks are required.  Please call to advise.

## 2020-10-03 NOTE — Telephone Encounter (Signed)
I spoke with Dr. Corliss Skains and reviewed these concerns.   Cold intolerance can be a symptoms of iron deficiency anemia.  Please advise the patient to follow up with PCP for iron studies for further evaluation.   We do not recommend attending events with large crowds especially if indoors since his is immunosuppressed but it will be his decision on whether he would like to attend or not.

## 2020-10-03 NOTE — Telephone Encounter (Signed)
Patient's wife advised Cold intolerance can be a symptoms of iron deficiency anemia.  Advised patient's wife to have the patient to follow up with PCP for iron studies for further evaluation.   We do not recommend attending events with large crowds especially if indoors since his is immunosuppressed but it will be his decision on whether he would like to attend or not.

## 2020-10-04 MED FILL — ENBREL MINI 50 MG/ML SOCT: 50 | 28 days supply | Qty: 4 | Fill #0

## 2020-10-06 DIAGNOSIS — F900 Attention-deficit hyperactivity disorder, predominantly inattentive type: Secondary | ICD-10-CM | POA: Diagnosis not present

## 2020-10-19 ENCOUNTER — Other Ambulatory Visit: Payer: Self-pay | Admitting: Cardiovascular Disease

## 2020-10-19 ENCOUNTER — Other Ambulatory Visit: Payer: Self-pay | Admitting: Physician Assistant

## 2020-10-19 ENCOUNTER — Other Ambulatory Visit: Payer: Self-pay | Admitting: Specialist

## 2020-10-19 DIAGNOSIS — F900 Attention-deficit hyperactivity disorder, predominantly inattentive type: Secondary | ICD-10-CM | POA: Diagnosis not present

## 2020-10-19 NOTE — Telephone Encounter (Signed)
Last Visit: 08/04/2020 Next Visit: 11/08/2020 Labs: 08/25/2020 CBC is normal, ALT is mildly elevated  Current Dose per office note on 08/04/2020: methotrexate 7 tablets p.o. weekly, folic acid 2 tablets p.o. daily. Dx: Seronegative rheumatoid arthritis  Okay to refill MTX and Folic Acid?

## 2020-10-24 DIAGNOSIS — D1801 Hemangioma of skin and subcutaneous tissue: Secondary | ICD-10-CM | POA: Diagnosis not present

## 2020-10-24 DIAGNOSIS — L814 Other melanin hyperpigmentation: Secondary | ICD-10-CM | POA: Diagnosis not present

## 2020-10-24 DIAGNOSIS — L821 Other seborrheic keratosis: Secondary | ICD-10-CM | POA: Diagnosis not present

## 2020-10-24 DIAGNOSIS — D2271 Melanocytic nevi of right lower limb, including hip: Secondary | ICD-10-CM | POA: Diagnosis not present

## 2020-10-25 DIAGNOSIS — E039 Hypothyroidism, unspecified: Secondary | ICD-10-CM | POA: Diagnosis not present
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
## 2020-10-31 MED FILL — ENBREL MINI 50 MG/ML SOCT: 50 | 28 days supply | Qty: 4 | Fill #1

## 2020-11-03 DIAGNOSIS — F900 Attention-deficit hyperactivity disorder, predominantly inattentive type: Secondary | ICD-10-CM | POA: Diagnosis not present

## 2020-11-08 ENCOUNTER — Other Ambulatory Visit: Payer: Self-pay

## 2020-11-08 ENCOUNTER — Encounter: Payer: Self-pay | Admitting: Rheumatology

## 2020-11-08 ENCOUNTER — Ambulatory Visit: Payer: BC Managed Care – PPO | Admitting: Rheumatology

## 2020-11-08 VITALS — BP 110/69 | HR 60 | Ht 72.0 in | Wt 194.6 lb

## 2020-11-08 DIAGNOSIS — F32A Depression, unspecified: Secondary | ICD-10-CM

## 2020-11-08 DIAGNOSIS — Z79899 Other long term (current) drug therapy: Secondary | ICD-10-CM

## 2020-11-08 DIAGNOSIS — Z8639 Personal history of other endocrine, nutritional and metabolic disease: Secondary | ICD-10-CM

## 2020-11-08 DIAGNOSIS — R5383 Other fatigue: Secondary | ICD-10-CM

## 2020-11-08 DIAGNOSIS — R748 Abnormal levels of other serum enzymes: Secondary | ICD-10-CM

## 2020-11-08 DIAGNOSIS — Z8679 Personal history of other diseases of the circulatory system: Secondary | ICD-10-CM

## 2020-11-08 DIAGNOSIS — M19042 Primary osteoarthritis, left hand: Secondary | ICD-10-CM

## 2020-11-08 DIAGNOSIS — F419 Anxiety disorder, unspecified: Secondary | ICD-10-CM

## 2020-11-08 DIAGNOSIS — M19041 Primary osteoarthritis, right hand: Secondary | ICD-10-CM

## 2020-11-08 DIAGNOSIS — M5136 Other intervertebral disc degeneration, lumbar region: Secondary | ICD-10-CM

## 2020-11-08 DIAGNOSIS — Z87438 Personal history of other diseases of male genital organs: Secondary | ICD-10-CM

## 2020-11-08 DIAGNOSIS — Z7189 Other specified counseling: Secondary | ICD-10-CM

## 2020-11-08 DIAGNOSIS — M542 Cervicalgia: Secondary | ICD-10-CM

## 2020-11-08 DIAGNOSIS — M0600 Rheumatoid arthritis without rheumatoid factor, unspecified site: Secondary | ICD-10-CM

## 2020-11-08 DIAGNOSIS — M19071 Primary osteoarthritis, right ankle and foot: Secondary | ICD-10-CM

## 2020-11-08 DIAGNOSIS — M19072 Primary osteoarthritis, left ankle and foot: Secondary | ICD-10-CM

## 2020-11-08 DIAGNOSIS — Z8659 Personal history of other mental and behavioral disorders: Secondary | ICD-10-CM

## 2020-11-08 NOTE — Patient Instructions (Addendum)
Standing Labs We placed an order today for your standing lab work.   Please have your standing labs drawn in the third week of December and then every 3 months  If possible, please have your labs drawn 2 weeks prior to your appointment so that the provider can discuss your results at your appointment.  We have open lab daily Monday through Thursday from 8:30-12:30 PM and 1:30-4:30 PM and Friday from 8:30-12:30 PM and 1:30-4:00 PM at the office of Dr. Pollyann Savoy, [facility].   Please be advised, patients with office appointments requiring lab work will take precedents over walk-in lab work.  If possible, please come for your lab work on Monday and Friday afternoons, as you may experience shorter wait times. The office is located at [address], [place], [place] [address] No appointment is necessary.   Labs are drawn by [facility]. Please bring your co-pay at the time of your lab draw.  You may receive a bill from [facility] for your lab work.  If you wish to have your labs drawn at another location, please call the office 24 hours in advance to send orders.  If you have any questions regarding directions or hours of operation,  please call [phone].   As a reminder, please drink plenty of water prior to coming for your lab work. Thanks!  Heart Disease Prevention   Your inflammatory disease increases your risk of heart disease which includes heart attack, stroke, atrial fibrillation (irregular heartbeats), high blood pressure, heart failure and atherosclerosis (plaque in the arteries).  It is important to reduce your risk by:   . Keep blood pressure, cholesterol, and blood sugar at healthy levels   . Smoking Cessation   . Maintain a healthy weight  o BMI 20-25   . Eat a healthy diet  o Plenty of fresh fruit, vegetables, and whole grains  o Limit saturated fats, foods high in sodium, and added sugars  o DASH and Mediterranean diet   . Increase physical  activity  o Recommend moderate physically activity for 150 minutes per week/ 30 minutes a day for five days a week These can be broken up into three separate ten-minute sessions during the day.   . Reduce Stress  . Meditation, slow breathing exercises, yoga, coloring books  . Dental visits twice a year

## 2020-11-17 ENCOUNTER — Other Ambulatory Visit: Payer: Self-pay

## 2020-11-17 DIAGNOSIS — R748 Abnormal levels of other serum enzymes: Secondary | ICD-10-CM | POA: Diagnosis not present

## 2020-11-18 ENCOUNTER — Other Ambulatory Visit: Payer: Self-pay | Admitting: *Deleted

## 2020-11-18 ENCOUNTER — Other Ambulatory Visit: Payer: Self-pay

## 2020-11-18 DIAGNOSIS — R748 Abnormal levels of other serum enzymes: Secondary | ICD-10-CM

## 2020-11-18 DIAGNOSIS — Z79899 Other long term (current) drug therapy: Secondary | ICD-10-CM

## 2020-11-18 LAB — CK: Total CK: 527 U/L — ABNORMAL HIGH (ref 44–196)

## 2020-11-18 NOTE — Progress Notes (Signed)
Patient will come to the office to have standing labs performed.

## 2020-11-21 ENCOUNTER — Other Ambulatory Visit: Payer: Self-pay | Admitting: *Deleted

## 2020-11-21 DIAGNOSIS — Z79899 Other long term (current) drug therapy: Secondary | ICD-10-CM | POA: Diagnosis not present

## 2020-11-22 DIAGNOSIS — F900 Attention-deficit hyperactivity disorder, predominantly inattentive type: Secondary | ICD-10-CM | POA: Diagnosis not present

## 2020-11-22 DIAGNOSIS — F331 Major depressive disorder, recurrent, moderate: Secondary | ICD-10-CM | POA: Diagnosis not present

## 2020-11-22 LAB — COMPLETE METABOLIC PANEL WITH GFR
AG Ratio: 1.8 (calc) (ref 1.0–2.5)
ALT: 54 U/L — ABNORMAL HIGH (ref 9–46)
AST: 41 U/L — ABNORMAL HIGH (ref 10–35)
Albumin: 4.4 g/dL (ref 3.6–5.1)
Alkaline phosphatase (APISO): 66 U/L (ref 35–144)
BUN: 20 mg/dL (ref 7–25)
CO2: 27 mmol/L (ref 20–32)
Calcium: 9.9 mg/dL (ref 8.6–10.3)
Chloride: 101 mmol/L (ref 98–110)
Creat: 1.03 mg/dL (ref 0.70–1.25)
GFR, Est African American: 90 mL/min/{1.73_m2} (ref >=60)
GFR, Est Non African American: 77 mL/min/{1.73_m2} (ref >=60)
Globulin: 2.5 g/dL (calc) (ref 1.9–3.7)
Glucose, Bld: 110 mg/dL — ABNORMAL HIGH (ref 65–99)
Potassium: 4.8 mmol/L (ref 3.5–5.3)
Sodium: 137 mmol/L (ref 135–146)
Total Bilirubin: 1.2 mg/dL (ref 0.2–1.2)
Total Protein: 6.9 g/dL (ref 6.1–8.1)

## 2020-11-22 LAB — CBC WITH DIFFERENTIAL/PLATELET
Absolute Monocytes: 566 cells/uL (ref 200–950)
Basophils Absolute: 77 cells/uL (ref 0–200)
Basophils Relative: 1.3 %
Eosinophils Absolute: 148 cells/uL (ref 15–500)
Eosinophils Relative: 2.5 %
HCT: 39.6 % (ref 38.5–50.0)
Hemoglobin: 13.5 g/dL (ref 13.2–17.1)
Lymphs Abs: 1510 cells/uL (ref 850–3900)
MCH: 32.1 pg (ref 27.0–33.0)
MCHC: 34.1 g/dL (ref 32.0–36.0)
MCV: 94.1 fL (ref 80.0–100.0)
MPV: 10.3 fL (ref 7.5–12.5)
Monocytes Relative: 9.6 %
Neutro Abs: 3599 cells/uL (ref 1500–7800)
Neutrophils Relative %: 61 %
Platelets: 294 10*3/uL (ref 140–400)
RBC: 4.21 10*6/uL (ref 4.20–5.80)
RDW: 12.9 % (ref 11.0–15.0)
Total Lymphocyte: 25.6 %
WBC: 5.9 10*3/uL (ref 3.8–10.8)

## 2020-11-22 NOTE — Progress Notes (Signed)
CBC is normal, LFTs are mildly elevated.  Patient is on methotrexate and statin combination.  Please advise him to reduce methotrexate to 6 tablets p.o. weekly.

## 2020-11-29 MED FILL — ENBREL MINI 50 MG/ML SOCT: 50 | 28 days supply | Qty: 4 | Fill #2

## 2020-12-13 DIAGNOSIS — Z79899 Other long term (current) drug therapy: Secondary | ICD-10-CM | POA: Diagnosis not present

## 2020-12-13 DIAGNOSIS — F900 Attention-deficit hyperactivity disorder, predominantly inattentive type: Secondary | ICD-10-CM | POA: Diagnosis not present

## 2020-12-13 DIAGNOSIS — F419 Anxiety disorder, unspecified: Secondary | ICD-10-CM | POA: Diagnosis not present

## 2020-12-14 ENCOUNTER — Encounter: Payer: Self-pay | Admitting: Rheumatology

## 2020-12-15 ENCOUNTER — Other Ambulatory Visit: Payer: Self-pay | Admitting: *Deleted

## 2020-12-15 DIAGNOSIS — R748 Abnormal levels of other serum enzymes: Secondary | ICD-10-CM
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
## 2020-12-16 LAB — CK: Total CK: 653 U/L — ABNORMAL HIGH (ref 44–196)

## 2020-12-16 NOTE — Progress Notes (Signed)
CK is elevated-653 and continues to trend up.  Reviewed lab work with Dr. Corliss Skains.  We will discuss scheduling a MRI vs. Muscle biopsy at his follow up visit on 03/09/21. He develops increased muscle weakness or muscle tenderness please advise the patient to notify us ASAP.

## 2020-12-21 ENCOUNTER — Other Ambulatory Visit: Payer: Self-pay | Admitting: Physician Assistant

## 2020-12-21 MED FILL — ENBREL MINI 50 MG/ML SOCT: 50 | 28 days supply | Qty: 4 | Fill #0

## 2020-12-21 NOTE — Telephone Encounter (Signed)
Last Visit: 11/08/2020 Next Visit: 03/09/2021 Labs: 11/21/2020 CBC is normal, LFTs are mildly elevated. TB Gold: 06/22/2020 Neg   Current Dose per office note 11/08/2020: - enbrel 50 mg subcu once weekly  DX: Seronegative rheumatoid arthritis   Okay to refill Enbrel?

## 2020-12-23 ENCOUNTER — Encounter: Payer: Self-pay | Admitting: Cardiovascular Disease

## 2020-12-23 ENCOUNTER — Ambulatory Visit: Payer: BC Managed Care – PPO | Admitting: Cardiovascular Disease

## 2020-12-23 ENCOUNTER — Other Ambulatory Visit: Payer: Self-pay

## 2020-12-23 VITALS — BP 102/70 | HR 52 | Ht 72.0 in | Wt 196.0 lb

## 2020-12-23 DIAGNOSIS — R931 Abnormal findings on diagnostic imaging of heart and coronary circulation: Secondary | ICD-10-CM

## 2020-12-23 NOTE — Patient Instructions (Signed)
Medication Instructions:  Your provider recommends that you continue on your current medications as directed. Please refer to the Current Medication list given to you today.   *If you need a refill on your cardiac medications before your next appointment, please call your pharmacy*  Lab Work: Your provider recommends that you return for FASTING lab work. If you have labs (blood work) drawn today and your tests are completely normal, you will receive your results only by: Marland Kitchen MyChart Message (if you have MyChart) OR . A paper copy in the mail If you have any lab test that is abnormal or we need to change your treatment, we will call you to review the results.  Follow-Up: At [facility], you and your health needs are our priority.  As part of our continuing mission to provide you with exceptional heart care, we have created designated Provider Care Teams.  These Care Teams include your primary Cardiologist (physician) and Advanced Practice Providers (APPs -  Physician Assistants and Nurse Practitioners) who all work together to provide you with the care you need, when you need it. Your next appointment:   12 month(s) The format for your next appointment:   In Person Provider:   You may see Charlton Haws, MD or one of the following Advanced Practice Providers on your designated Care Team:    Nada Boozer, NP  Georgie Chard, NP

## 2020-12-29 ENCOUNTER — Other Ambulatory Visit: Payer: Self-pay

## 2020-12-29 ENCOUNTER — Other Ambulatory Visit: Payer: BC Managed Care – PPO

## 2020-12-29 DIAGNOSIS — R931 Abnormal findings on diagnostic imaging of heart and coronary circulation: Secondary | ICD-10-CM

## 2020-12-29 LAB — LIPID PANEL
Chol/HDL Ratio: 2.3 ratio (ref 0.0–5.0)
Cholesterol, Total: 180 mg/dL (ref 100–199)
HDL: 78 mg/dL (ref >39)
LDL Chol Calc (NIH): 92 mg/dL (ref 0–99)
Triglycerides: 49 mg/dL (ref 0–149)
VLDL Cholesterol Cal: 10 mg/dL (ref 5–40)

## 2020-12-29 LAB — HEPATIC FUNCTION PANEL
ALT: 41 IU/L (ref 0–44)
AST: 38 IU/L (ref 0–40)
Albumin: 4.3 g/dL (ref 3.8–4.8)
Alkaline Phosphatase: 79 IU/L (ref 44–121)
Bilirubin Total: 1.1 mg/dL (ref 0.0–1.2)
Bilirubin, Direct: 0.27 mg/dL (ref 0.00–0.40)
Total Protein: 6.8 g/dL (ref 6.0–8.5)

## 2021-01-18 ENCOUNTER — Other Ambulatory Visit: Payer: Self-pay | Admitting: Cardiovascular Disease

## 2021-01-18 ENCOUNTER — Other Ambulatory Visit: Payer: Self-pay | Admitting: Physician Assistant

## 2021-01-18 ENCOUNTER — Other Ambulatory Visit: Payer: Self-pay | Admitting: Specialist

## 2021-01-18 NOTE — Telephone Encounter (Signed)
Please advise on Rx. Thank you

## 2021-01-18 NOTE — Telephone Encounter (Signed)
Last Visit: 11/08/2020 Next Visit: 03/09/2021 Labs: 11/21/2020, CBC is normal, LFTs are mildly elevated. Patient is on methotrexate and statin combination. Please advise him to reduce methotrexate to 6 tablets p.o. weekly  Current Dose per office note 11/08/2020, methotrexate 7 tablets p.o. weekly, folic acid 2 tablets p.o. daily. DX:  Seronegative rheumatoid arthritis   Last Fill: 10/19/2020  Okay to refill MTX and folic acid?

## 2021-01-23 MED FILL — ENBREL MINI 50 MG/ML SOCT: 50 | 28 days supply | Qty: 4 | Fill #1

## 2021-02-01 DIAGNOSIS — F4321 Adjustment disorder with depressed mood: Secondary | ICD-10-CM | POA: Diagnosis not present

## 2021-02-01 DIAGNOSIS — F3342 Major depressive disorder, recurrent, in full remission: Secondary | ICD-10-CM | POA: Diagnosis not present

## 2021-02-13 ENCOUNTER — Other Ambulatory Visit: Payer: Self-pay

## 2021-02-13 DIAGNOSIS — Z79899 Other long term (current) drug therapy: Secondary | ICD-10-CM | POA: Diagnosis not present

## 2021-02-14 LAB — CBC WITH DIFFERENTIAL/PLATELET
Absolute Monocytes: 587 cells/uL (ref 200–950)
Basophils Absolute: 73 cells/uL (ref 0–200)
Basophils Relative: 1.1 %
Eosinophils Absolute: 218 cells/uL (ref 15–500)
Eosinophils Relative: 3.3 %
HCT: 37.9 % — ABNORMAL LOW (ref 38.5–50.0)
Hemoglobin: 12.8 g/dL — ABNORMAL LOW (ref 13.2–17.1)
Lymphs Abs: 1650 cells/uL (ref 850–3900)
MCH: 32.9 pg (ref 27.0–33.0)
MCHC: 33.8 g/dL (ref 32.0–36.0)
MCV: 97.4 fL (ref 80.0–100.0)
MPV: 10.2 fL (ref 7.5–12.5)
Monocytes Relative: 8.9 %
Neutro Abs: 4072 cells/uL (ref 1500–7800)
Neutrophils Relative %: 61.7 %
Platelets: 294 10*3/uL (ref 140–400)
RBC: 3.89 10*6/uL — ABNORMAL LOW (ref 4.20–5.80)
RDW: 12.5 % (ref 11.0–15.0)
Total Lymphocyte: 25 %
WBC: 6.6 10*3/uL (ref 3.8–10.8)

## 2021-02-14 LAB — COMPLETE METABOLIC PANEL WITH GFR
AG Ratio: 1.8 (calc) (ref 1.0–2.5)
ALT: 38 U/L (ref 9–46)
AST: 34 U/L (ref 10–35)
Albumin: 4.2 g/dL (ref 3.6–5.1)
Alkaline phosphatase (APISO): 76 U/L (ref 35–144)
BUN: 21 mg/dL (ref 7–25)
CO2: 28 mmol/L (ref 20–32)
Calcium: 9.3 mg/dL (ref 8.6–10.3)
Chloride: 106 mmol/L (ref 98–110)
Creat: 1.14 mg/dL (ref 0.70–1.25)
GFR, Est African American: 79 mL/min/{1.73_m2} (ref >=60)
GFR, Est Non African American: 69 mL/min/{1.73_m2} (ref >=60)
Globulin: 2.4 g/dL (calc) (ref 1.9–3.7)
Glucose, Bld: 97 mg/dL (ref 65–99)
Potassium: 4.7 mmol/L (ref 3.5–5.3)
Sodium: 139 mmol/L (ref 135–146)
Total Bilirubin: 0.9 mg/dL (ref 0.2–1.2)
Total Protein: 6.6 g/dL (ref 6.1–8.1)

## 2021-02-14 LAB — CK: Total CK: 316 U/L — ABNORMAL HIGH (ref 44–196)

## 2021-02-14 LAB — TEST AUTHORIZATION

## 2021-02-14 NOTE — Progress Notes (Signed)
Mild anemia noted . Labs are stable.

## 2021-02-14 NOTE — Progress Notes (Signed)
Ok to add CK

## 2021-02-15 NOTE — Progress Notes (Signed)
CK is 316 which is better than previous values.

## 2021-02-20 MED FILL — ENBREL MINI 50 MG/ML SOCT: 50 | 28 days supply | Qty: 4 | Fill #2
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
## 2021-02-27 ENCOUNTER — Telehealth: Payer: Self-pay

## 2021-02-27 NOTE — Telephone Encounter (Signed)
Please advise 

## 2021-02-27 NOTE — Telephone Encounter (Signed)
Patient called stating his PCP has just retired and would like Dr. Fatima Sanger recommendation on an internist who would be able to help him with his health issues.    Patient also states he is going back to in-person meetings at his church and is asking if wearing the N95 mask will be enough to keep him safe.  Patient states there are approximately 150 people who attend.

## 2021-02-28 ENCOUNTER — Encounter: Payer: Self-pay | Admitting: Rheumatology

## 2021-02-28 ENCOUNTER — Encounter: Payer: Self-pay | Admitting: Specialist

## 2021-02-28 ENCOUNTER — Encounter: Payer: Self-pay | Admitting: *Deleted

## 2021-02-28 NOTE — Telephone Encounter (Signed)
I found the following doctors under internal medicine-Dr. Patrice Paradise, Dr. Alphonsus Sias, Dr. Claiborne Billings, Dr. Asencion Partridge, Dr. Valentino Hue

## 2021-02-28 NOTE — Telephone Encounter (Signed)
I would recommend adding a copy of the medical records from [place] and other medical providers.  Record records are going to be more detailed than the letter.

## 2021-02-28 NOTE — Telephone Encounter (Signed)
I called patient, provider list sent through MyChart.

## 2021-03-02 ENCOUNTER — Other Ambulatory Visit (HOSPITAL_COMMUNITY): Payer: Self-pay

## 2021-03-03 ENCOUNTER — Telehealth: Payer: Self-pay | Admitting: Specialist

## 2021-03-03 NOTE — Telephone Encounter (Signed)
IC,lmvm advised patient has to sign release form.  Once we receive  valid auth, we will call when copy of records are ready.

## 2021-03-03 NOTE — Telephone Encounter (Signed)
Pts wife [name] called stating she will need to pick up the pts medical records to submit to insurance. She would like to have the records together so that when she comes in to sign the release form she can pick it up. [name] would like a CB when the records are ready.   [phone]

## 2021-03-06 ENCOUNTER — Telehealth: Payer: Self-pay | Admitting: Specialist

## 2021-03-06 NOTE — Telephone Encounter (Signed)
Received call from pts wife, [name]. Explained why patient needed to sign release form. I emailed form to her [email or web address]. patient will sign and I will call when ready.

## 2021-03-09 ENCOUNTER — Ambulatory Visit: Payer: BC Managed Care – PPO | Admitting: Physician Assistant

## 2021-03-09 ENCOUNTER — Encounter: Payer: Self-pay | Admitting: Physician Assistant

## 2021-03-09 ENCOUNTER — Other Ambulatory Visit: Payer: Self-pay

## 2021-03-09 VITALS — BP 115/73 | HR 67 | Ht 72.0 in | Wt 193.8 lb

## 2021-03-09 DIAGNOSIS — M47812 Spondylosis without myelopathy or radiculopathy, cervical region: Secondary | ICD-10-CM

## 2021-03-09 DIAGNOSIS — R748 Abnormal levels of other serum enzymes: Secondary | ICD-10-CM | POA: Diagnosis not present

## 2021-03-09 DIAGNOSIS — Z87438 Personal history of other diseases of male genital organs: Secondary | ICD-10-CM

## 2021-03-09 DIAGNOSIS — M19041 Primary osteoarthritis, right hand: Secondary | ICD-10-CM

## 2021-03-09 DIAGNOSIS — M19071 Primary osteoarthritis, right ankle and foot: Secondary | ICD-10-CM

## 2021-03-09 DIAGNOSIS — F32A Depression, unspecified: Secondary | ICD-10-CM

## 2021-03-09 DIAGNOSIS — Z8659 Personal history of other mental and behavioral disorders: Secondary | ICD-10-CM

## 2021-03-09 DIAGNOSIS — E611 Iron deficiency: Secondary | ICD-10-CM | POA: Diagnosis not present

## 2021-03-09 DIAGNOSIS — Z79899 Other long term (current) drug therapy: Secondary | ICD-10-CM | POA: Diagnosis not present

## 2021-03-09 DIAGNOSIS — Z8639 Personal history of other endocrine, nutritional and metabolic disease: Secondary | ICD-10-CM

## 2021-03-09 DIAGNOSIS — F419 Anxiety disorder, unspecified: Secondary | ICD-10-CM

## 2021-03-09 DIAGNOSIS — R252 Cramp and spasm: Secondary | ICD-10-CM

## 2021-03-09 DIAGNOSIS — R5383 Other fatigue: Secondary | ICD-10-CM | POA: Diagnosis not present

## 2021-03-09 DIAGNOSIS — M542 Cervicalgia: Secondary | ICD-10-CM

## 2021-03-09 DIAGNOSIS — D519 Vitamin B12 deficiency anemia, unspecified: Secondary | ICD-10-CM | POA: Diagnosis not present

## 2021-03-09 DIAGNOSIS — M5136 Other intervertebral disc degeneration, lumbar region: Secondary | ICD-10-CM

## 2021-03-09 DIAGNOSIS — M19042 Primary osteoarthritis, left hand: Secondary | ICD-10-CM

## 2021-03-09 DIAGNOSIS — M0600 Rheumatoid arthritis without rheumatoid factor, unspecified site: Secondary | ICD-10-CM

## 2021-03-09 DIAGNOSIS — Z8679 Personal history of other diseases of the circulatory system: Secondary | ICD-10-CM

## 2021-03-09 DIAGNOSIS — M19072 Primary osteoarthritis, left ankle and foot: Secondary | ICD-10-CM

## 2021-03-09 NOTE — Patient Instructions (Signed)
Standing Labs We placed an order today for your standing lab work.   Please have your standing labs drawn in June and every 3 months   If possible, please have your labs drawn 2 weeks prior to your appointment so that the provider can discuss your results at your appointment.  We have open lab daily Monday through Thursday from 1:30-4:30 PM and Friday from 1:30-4:00 PM at the office of Dr. Shaili Deveshwar, [facility].   Please be advised, all patients with office appointments requiring lab work will take precedents over walk-in lab work.  If possible, please come for your lab work on Monday and Friday afternoons, as you may experience shorter wait times. The office is located at [address], [address], [place], [place] [address] No appointment is necessary.   Labs are drawn by [facility]. Please bring your co-pay at the time of your lab draw.  You may receive a bill from [facility] for your lab work.  If you wish to have your labs drawn at another location, please call the office 24 hours in advance to send orders.  If you have any questions regarding directions or hours of operation,  please call [phone].   As a reminder, please drink plenty of water prior to coming for your lab work. Thanks!   

## 2021-03-10 DIAGNOSIS — M791 Myalgia, unspecified site: Secondary | ICD-10-CM

## 2021-03-10 DIAGNOSIS — G8929 Other chronic pain: Secondary | ICD-10-CM

## 2021-03-10 NOTE — Progress Notes (Signed)
Vitamin B12 WNL.  Vitamin D is within the desirable range.  Iron panel is normal. ESR is borderline elevated but has improved.    TSH is elevated-6.4.  This could be contributing to the increased fatigue he has been experiencing.  Please clarify if he has been taking synthroid as prescribed.  He will need to discuss with PCP to see if he needs a dose adjustment.  He has been looking for a new internist but please clarify if he can return to his previous PCPs practice for further evaluation and management of uncontrolled hypothyroidism.

## 2021-03-13 NOTE — Telephone Encounter (Signed)
Ok to place referral for [facility] PT (including water therapy) for lower back pain and myalgias.   [facility] should call the patient to schedule the appointment.

## 2021-03-13 NOTE — Telephone Encounter (Signed)
Okay to place physical therapy referral that patient has requested?

## 2021-03-13 NOTE — Telephone Encounter (Signed)
Please sign referral for PT. Thanks!

## 2021-03-15 ENCOUNTER — Other Ambulatory Visit: Payer: Self-pay | Admitting: Physician Assistant

## 2021-03-15 ENCOUNTER — Other Ambulatory Visit (HOSPITAL_COMMUNITY): Payer: Self-pay

## 2021-03-15 LAB — IRON,TIBC AND FERRITIN PANEL
%SAT: 24 % (calc) (ref 20–48)
Ferritin: 59 ng/mL (ref 24–380)
Iron: 73 ug/dL (ref 50–180)
TIBC: 298 mcg/dL (calc) (ref 250–425)

## 2021-03-15 LAB — HMGCR AB (IGG): HMGCR AB (IGG): <2 CU (ref <20)

## 2021-03-15 LAB — VITAMIN B12: Vitamin B-12: 385 pg/mL (ref 200–1100)

## 2021-03-15 LAB — SEDIMENTATION RATE: Sed Rate: 33 mm/h — ABNORMAL HIGH (ref 0–20)

## 2021-03-15 LAB — VITAMIN D 25 HYDROXY (VIT D DEFICIENCY, FRACTURES): Vit D, 25-Hydroxy: 57 ng/mL (ref 30–100)

## 2021-03-15 LAB — TSH: TSH: 6.4 mIU/L — ABNORMAL HIGH (ref 0.40–4.50)

## 2021-03-15 MED ORDER — ENBREL MINI 50 MG/ML ~~LOC~~ SOCT
50.0000 mg | SUBCUTANEOUS | 0 refills | Status: DC
  Filled 2021-03-15: qty 4, 28d supply, fill #0
  Filled 2021-06-06: qty 4, 28d supply, fill #1
  Filled 2021-06-30: qty 4, 28d supply, fill #2

## 2021-03-15 NOTE — Telephone Encounter (Signed)
Next Visit: message sent to front desk to schedule appt, Return in about 3 months (around 06/08/2021) for Rheumatoid arthritis, Osteoarthritis, DDD  Last Visit: 03/09/2021  Last Fill: 12/21/2020  DX: Seronegative rheumatoid arthritis   Current Dose per office note 03/09/2021,  Enbrel 50 mg sq injections once weekly  Labs: 02/13/2021, Mild anemia noted. Labs are stable, CK is 316 which is better than previous values.  TB Gold: 06/22/2020, negative  Okay to refill Enbrel?

## 2021-03-15 NOTE — Telephone Encounter (Signed)
Please call patient to schedule appt. Thank you.  Return in about 3 months (around 06/08/2021) for Rheumatoid arthritis, Osteoarthritis, DDD

## 2021-03-15 NOTE — Progress Notes (Signed)
HMGCR Ab negative.

## 2021-03-20 ENCOUNTER — Telehealth: Payer: Self-pay | Admitting: Cardiovascular Disease

## 2021-03-20 ENCOUNTER — Other Ambulatory Visit (HOSPITAL_COMMUNITY): Payer: Self-pay

## 2021-03-20 NOTE — Telephone Encounter (Signed)
Patient would like for Dr. Eden Emms to place an order for a CT Cardiac Scoring since last one was in 2019.

## 2021-03-20 NOTE — Telephone Encounter (Signed)
Per Dr. Eden Emms the patient does not need another one until September this year.  Called and let patient know.  He would like to schedule this now.  I have put in the order and adv that one of our schedulers will call him to arrange.

## 2021-03-20 NOTE — Telephone Encounter (Signed)
Last calcium score ct was 08/2018. To Dr. Eden Emms for recommendations.

## 2021-03-21 ENCOUNTER — Encounter: Payer: Self-pay | Admitting: Rheumatology

## 2021-03-22 NOTE — Telephone Encounter (Signed)
Patient may discontinue methotrexate 1 week prior to the procedure and resume 1 week later if there is no infection and is healing well.  He would need clearance from the dentist to resume methotrexate.

## 2021-03-29 DIAGNOSIS — E039 Hypothyroidism, unspecified: Secondary | ICD-10-CM | POA: Diagnosis not present

## 2021-03-29 DIAGNOSIS — F988 Other specified behavioral and emotional disorders with onset usually occurring in childhood and adolescence: Secondary | ICD-10-CM | POA: Diagnosis not present

## 2021-03-29 DIAGNOSIS — M0600 Rheumatoid arthritis without rheumatoid factor, unspecified site: Secondary | ICD-10-CM | POA: Diagnosis not present

## 2021-03-29 DIAGNOSIS — R5383 Other fatigue: Secondary | ICD-10-CM | POA: Diagnosis not present

## 2021-03-31 ENCOUNTER — Telehealth: Payer: Self-pay

## 2021-03-31 NOTE — Telephone Encounter (Signed)
I called [facility] only wants to speak to Paul [name] stated the co-pay for [facility] is $100 each visit. [name] was advised Ladona Ridgel will return call next week.

## 2021-03-31 NOTE — Telephone Encounter (Signed)
Leah called requesting to speak with [facility] directly.  [name] states she has two quick questions:  (1) Regarding Jachin's referral to aquatic therapy and (2) B12.  Please call back on her mobile #[phone]

## 2021-04-03 ENCOUNTER — Telehealth: Payer: Self-pay | Admitting: Physician Assistant

## 2021-04-03 DIAGNOSIS — M545 Low back pain, unspecified: Secondary | ICD-10-CM

## 2021-04-03 DIAGNOSIS — R5383 Other fatigue: Secondary | ICD-10-CM

## 2021-04-03 DIAGNOSIS — G8929 Other chronic pain: Secondary | ICD-10-CM

## 2021-04-03 DIAGNOSIS — M791 Myalgia, unspecified site: Secondary | ICD-10-CM

## 2021-04-03 NOTE — Telephone Encounter (Signed)
I returned the patient's wife's call. Please see telephone encounter for detailed documentation.

## 2021-04-03 NOTE — Telephone Encounter (Signed)
Future order for vitamin b12 and referral to [facility] and fitness physical therapy referral has been pended. Please review and sign.

## 2021-04-03 NOTE — Addendum Note (Signed)
Addended by: Geroge Baseman C on: 04/03/2021 01:00 PM   Modules accepted: Orders

## 2021-04-03 NOTE — Telephone Encounter (Signed)
I called the patient to update him and his wife regarding the covid-19 vaccine guidelines. As of 03/31/21 the CDC recommendations are: Patients over the age of 18 who are moderately or severely immunocompromised should receive 4 total doses of the covid-19 vaccine (primary series of 3 doses plus when eligible, 1 booster dose).    Per CDC and ACR the patient is full vaccinated at this time.    Dr. Corliss Skains and I do not recommend a 5th dose at this time.    Him and his wife can continue to check [email or web address] for updates about the vaccine.   All questions addressed.   Sherron Ales, PA-C

## 2021-04-03 NOTE — Telephone Encounter (Signed)
I returned the patient's wife's call to answer additional questions.   1. Patient was referred to [facility] for aquatic therapy at his last office visit, but it turns out that his copay will be $100 each session. His wife requested a referral to [facility] and fitness at [facility] for aquatic therapy, which should be a more affordable option for him to continue aquatic therapy long term.  Referral will be placed today.   2. Upon further review of lab work from 03/09/21, the patient is concerned that his vitamin B12 level is on the lower end of normal-385 and he has persistent anemia.  He has been taking a multivitamin on a daily basis.  He continues to have persistent fatigue.  Discussed that he can try increasing his vitamin B12 to 1000 mcg daily for 1 month.  We will recheck vitamin B12 in 1 month.   3. He received his 2nd covid-19 booster on 12/27/20.  His wife follows the CDC on social media, and she received an alert about immunocompromised patients receiving the 5th vaccine dose.  I discussed that I will need to check ACRs website for updated guidelines prior to making further recommendations.    4. Also discussed that the patient and his wife would both prefer for our office to contact Leah directly on her cell phone at [phone] going forward.  She would prefer to be contacted by Dr. Corliss Skains or myself directly.    All questions were addressed.     Sherron Ales, PA-C

## 2021-04-03 NOTE — Addendum Note (Signed)
Addended by: Gearldine Bienenstock on: 04/03/2021 01:28 PM   Modules accepted: Orders

## 2021-04-05 ENCOUNTER — Other Ambulatory Visit: Payer: Self-pay | Admitting: Physician Assistant

## 2021-04-05 NOTE — Telephone Encounter (Signed)
Next Visit: 06/15/2021  Last Visit: 03/09/2021  Last Fill: 01/18/2021  DX: Seronegative rheumatoid arthritis   Current Dose per office note 03/09/2021, Methotrexate 6 tablets by mouth once weekly  Labs: 02/13/2021, Mild anemia noted. Labs are stable, CK is 316 which is better than previous values.  Okay to refill MTX?

## 2021-04-10 ENCOUNTER — Other Ambulatory Visit: Payer: Self-pay | Admitting: Cardiovascular Disease

## 2021-04-11 ENCOUNTER — Other Ambulatory Visit: Payer: Self-pay | Admitting: Specialist

## 2021-04-12 ENCOUNTER — Other Ambulatory Visit (HOSPITAL_COMMUNITY): Payer: Self-pay

## 2021-04-12 DIAGNOSIS — F411 Generalized anxiety disorder: Secondary | ICD-10-CM | POA: Diagnosis not present

## 2021-04-12 DIAGNOSIS — F900 Attention-deficit hyperactivity disorder, predominantly inattentive type: Secondary | ICD-10-CM | POA: Diagnosis not present

## 2021-04-13 ENCOUNTER — Telehealth: Payer: Self-pay | Admitting: Rheumatology

## 2021-04-13 NOTE — Telephone Encounter (Signed)
Please advise 

## 2021-04-13 NOTE — Telephone Encounter (Signed)
Please obtain documents for Dr. Corliss Skains to review next week when she is back in the office in order to determine if this is something we will be able to complete for the patient.

## 2021-04-13 NOTE — Telephone Encounter (Signed)
Patient's wife calling in reference to a disability he has been getting paid for the past two years for. 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 Patient has not seen the surgeon that put him on disablity in two years now. Surgeons office filled out as much as they can about patient's  ability to work, but insurance company needs to hear from current doctors. Insurance requesting statement from Dr. Corliss Skains. Patient's wife has a form from insurance, and surgeon's partial filled out form so Dr. Corliss Skains can see that, if needs. Please call wife to discuss what you need if anything, and if doctor can help with this matter.

## 2021-04-13 NOTE — Telephone Encounter (Signed)
Spoke with patient's wife, Paul [name], advised to  obtain documents for Dr. Corliss Skains to review next week when she is back in the office in order to determine if this is something we will be able to complete for the patient.   Per patient's wife she will drop the paperwork off.

## 2021-04-17 ENCOUNTER — Encounter (HOSPITAL_BASED_OUTPATIENT_CLINIC_OR_DEPARTMENT_OTHER): Payer: Self-pay | Admitting: Physical Therapy

## 2021-04-17 ENCOUNTER — Other Ambulatory Visit: Payer: Self-pay

## 2021-04-17 ENCOUNTER — Ambulatory Visit (HOSPITAL_BASED_OUTPATIENT_CLINIC_OR_DEPARTMENT_OTHER): Payer: BC Managed Care – PPO | Attending: Physician Assistant | Admitting: Physical Therapy

## 2021-04-17 DIAGNOSIS — M545 Low back pain, unspecified: Secondary | ICD-10-CM | POA: Diagnosis not present

## 2021-04-17 DIAGNOSIS — R2689 Other abnormalities of gait and mobility: Secondary | ICD-10-CM | POA: Diagnosis not present

## 2021-04-17 DIAGNOSIS — G8929 Other chronic pain: Secondary | ICD-10-CM | POA: Insufficient documentation

## 2021-04-17 NOTE — Patient Instructions (Signed)
Access Code: [email or web address] URL: [email or web address] Date: 04/17/2021 Prepared by: Lorayne Bender  Program Notes [email or web address]    Exercises Supine Piriformis Stretch with Foot on Ground - 3 x daily - 7 x weekly - 3 sets - 3 reps - 20sec hold Seated Hamstring Stretch - 3 x daily - 7 x weekly - 3 sets - 3 reps - 20sec hold Standing Glute Med Mobilization with Small Ball on Wall - 1 x daily - 7 x weekly - 3 sets - 1 reps - 2 min hold

## 2021-04-18 ENCOUNTER — Encounter (HOSPITAL_BASED_OUTPATIENT_CLINIC_OR_DEPARTMENT_OTHER): Payer: Self-pay | Admitting: Physical Therapy

## 2021-04-18 ENCOUNTER — Telehealth: Payer: Self-pay

## 2021-04-18 NOTE — Telephone Encounter (Signed)
Patient's wife [name] left a voicemail regarding paperwork that she dropped off at the office last Thursday.   Leah wanted to know if Dr. Corliss Skains had an opportunity to look at that yet.  Please call back on her mobile #[phone]

## 2021-04-18 NOTE — Telephone Encounter (Signed)
I spoke with [name] (patient's wife) and explained to her that we will not be able to fill functional capacity evaluation form.  She will be able to get that filled through [facility] PCP or a physical therapy group.  She was in agreement.  She will pick up the papers from the front desk.
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
## 2021-04-19 ENCOUNTER — Telehealth: Payer: Self-pay

## 2021-04-19 NOTE — Telephone Encounter (Signed)
Patient stopped by the office to pick up paperwork and wanted Dr. Corliss Skains to know that he is very disappointed that she was unable to help him.  Patient states he is not able to work full-time in any capacity and is currently undergoing aquatic therapy.

## 2021-04-24 ENCOUNTER — Other Ambulatory Visit: Payer: Self-pay | Admitting: *Deleted

## 2021-04-24 ENCOUNTER — Other Ambulatory Visit: Payer: Self-pay

## 2021-04-24 ENCOUNTER — Telehealth: Payer: Self-pay | Admitting: Cardiovascular Disease

## 2021-04-24 ENCOUNTER — Ambulatory Visit (INDEPENDENT_AMBULATORY_CARE_PROVIDER_SITE_OTHER)
Admission: RE | Admit: 2021-04-24 | Discharge: 2021-04-24 | Disposition: A | Discharge status: Home / Self Care | Payer: Self-pay | Source: Ambulatory Visit | Attending: Cardiovascular Disease | Admitting: Cardiovascular Disease

## 2021-04-24 DIAGNOSIS — Z8249 Family history of ischemic heart disease and other diseases of the circulatory system: Secondary | ICD-10-CM

## 2021-04-24 DIAGNOSIS — Z8639 Personal history of other endocrine, nutritional and metabolic disease: Secondary | ICD-10-CM

## 2021-04-24 DIAGNOSIS — R931 Abnormal findings on diagnostic imaging of heart and coronary circulation: Secondary | ICD-10-CM

## 2021-04-24 MED ORDER — EZETIMIBE 10 MG PO TABS: 10.0000 mg | ORAL_TABLET | Freq: Every day | ORAL | 1 refills | Status: DC

## 2021-04-24 NOTE — Telephone Encounter (Signed)
RN returned call to patients wife who states they would like clarification regarding the medication. Patient was originally taking the crestor 5mg  three times a week, but PCP recently told patient to take crestor 5mg  5 times a week. Wife states they would like to know Dr. advice on increasing the crestor before switching medications. RN advised I would see to Dr. for clarification. Wife verbalized understanding.

## 2021-04-24 NOTE — Telephone Encounter (Signed)
Patient's wife states she is returning a call from today. She states there was no name left in the vm and I did not see any notes. She requests the call back go to her cell phone: [phone]

## 2021-04-24 NOTE — Progress Notes (Signed)
Referral sent to lipid clinic for appointment and possible start of zetia.

## 2021-04-24 NOTE — Progress Notes (Signed)
Left detailed voicemail message on phone but requested patient to call back and verify he received message.  Need to schedule a lab in 2 months to recheck and will need appointment.

## 2021-04-25 ENCOUNTER — Telehealth: Payer: Self-pay | Admitting: Cardiovascular Disease

## 2021-04-25 DIAGNOSIS — E039 Hypothyroidism, unspecified: Secondary | ICD-10-CM | POA: Diagnosis not present

## 2021-04-25 NOTE — Telephone Encounter (Signed)
New message:   Patient wife would like for the pharmacy t to know the day of his apt he need to be out by 2:30 pm that day. Please advise.

## 2021-04-25 NOTE — Telephone Encounter (Signed)
RN returned call to patients wife to update of Dr.Nishans recommendations. Patients wife states they have an appointment setup for 5/26 with the lipid clinic and they will plan to speak with the pharmacist and let them advise on if and when to start the zetia. RN instructed wife to contact the office in the meantime with any questions or concerns.

## 2021-04-26 DIAGNOSIS — F411 Generalized anxiety disorder: Secondary | ICD-10-CM | POA: Diagnosis not present

## 2021-04-26 DIAGNOSIS — F900 Attention-deficit hyperactivity disorder, predominantly inattentive type: Secondary | ICD-10-CM | POA: Diagnosis not present

## 2021-04-27 ENCOUNTER — Telehealth: Payer: Self-pay

## 2021-04-27 ENCOUNTER — Other Ambulatory Visit: Payer: Self-pay

## 2021-04-27 ENCOUNTER — Ambulatory Visit (INDEPENDENT_AMBULATORY_CARE_PROVIDER_SITE_OTHER): Payer: BC Managed Care – PPO | Admitting: Pharmacist

## 2021-04-27 DIAGNOSIS — I2510 Atherosclerotic heart disease of native coronary artery without angina pectoris: Secondary | ICD-10-CM

## 2021-04-27 DIAGNOSIS — N400 Enlarged prostate without lower urinary tract symptoms: Secondary | ICD-10-CM | POA: Insufficient documentation

## 2021-04-27 DIAGNOSIS — F341 Dysthymic disorder: Secondary | ICD-10-CM | POA: Insufficient documentation

## 2021-04-27 DIAGNOSIS — M1990 Unspecified osteoarthritis, unspecified site: Secondary | ICD-10-CM | POA: Insufficient documentation

## 2021-04-27 DIAGNOSIS — E063 Autoimmune thyroiditis: Secondary | ICD-10-CM | POA: Insufficient documentation

## 2021-04-27 DIAGNOSIS — E039 Hypothyroidism, unspecified: Secondary | ICD-10-CM | POA: Insufficient documentation

## 2021-04-27 DIAGNOSIS — G4700 Insomnia, unspecified: Secondary | ICD-10-CM | POA: Insufficient documentation

## 2021-04-27 DIAGNOSIS — R899 Unspecified abnormal finding in specimens from other organs, systems and tissues: Secondary | ICD-10-CM | POA: Insufficient documentation

## 2021-04-27 DIAGNOSIS — R7301 Impaired fasting glucose: Secondary | ICD-10-CM | POA: Insufficient documentation

## 2021-04-27 DIAGNOSIS — F419 Anxiety disorder, unspecified: Secondary | ICD-10-CM | POA: Insufficient documentation

## 2021-04-27 DIAGNOSIS — Z79899 Other long term (current) drug therapy: Secondary | ICD-10-CM | POA: Diagnosis not present

## 2021-04-27 DIAGNOSIS — I8390 Asymptomatic varicose veins of unspecified lower extremity: Secondary | ICD-10-CM | POA: Insufficient documentation

## 2021-04-27 DIAGNOSIS — E785 Hyperlipidemia, unspecified: Secondary | ICD-10-CM

## 2021-04-27 DIAGNOSIS — F902 Attention-deficit hyperactivity disorder, combined type: Secondary | ICD-10-CM | POA: Diagnosis not present

## 2021-04-27 DIAGNOSIS — R413 Other amnesia: Secondary | ICD-10-CM | POA: Insufficient documentation

## 2021-04-27 DIAGNOSIS — F909 Attention-deficit hyperactivity disorder, unspecified type: Secondary | ICD-10-CM | POA: Insufficient documentation

## 2021-04-27 DIAGNOSIS — R931 Abnormal findings on diagnostic imaging of heart and coronary circulation: Secondary | ICD-10-CM | POA: Insufficient documentation

## 2021-04-27 MED ORDER — REPATHA SURECLICK 140 MG/ML ~~LOC~~ SOAJ: 140.0000 mg | SUBCUTANEOUS | 11 refills | Status: DC

## 2021-04-27 NOTE — Patient Instructions (Addendum)
It was nice meeting you!  We would like your LDL (bad cholesterol) to be less than 70  We will start you on a new medication called Repatha which you will inject once every 2 weeks  We will recheck your cholesterol in about 3 months  Continue to watch your diet and try to be as physically active as you can  Please call with any questions!  Laural Golden, PharmD, BCACP, CDCES, CPP [facility] [address], [place], [place] [address] Phone: [phone]; Fax: [phone] 04/27/2021 2:37 PM

## 2021-04-27 NOTE — Progress Notes (Signed)
Patient ID: MATAIO MELE                 DOB: [birth year]                    MRN: [id number]     HPI: Paul [name] is a 65 y.o. male patient referred to lipid clinic by Dr Eden Emms. PMH is significant for HLD, BPH, pre DM, anxiety, and an elevated coronary calcium, family history of early CAD and statin intolerance.  Was intially prescribed rosuvastatin 5 mg three times weekly.  PCP has recently increased to 5 times weekly.  Patient presents today with wife in good spirits.  Reports he is in a great deal of pain due to RA so is not sure if he is having pain from statin usage. Has muscle weakness and is followed by rheuamtology.  Recent coronary calcium score of 191 which was 73rd percentile for age/race/sex.  Patient and wife very concerned due to father having CABG in 68s.    Wife also concerned regarding patient's pre DM diagnosis and diet.  A1c 6.2 in 2021.  Is not able to be physically active due to muscle weakness. Starts water aerobic exercises next month.  Current Medications: rosuvastatin 5 mg 5x a week Intolerances: atorvastatin Risk Factors: family history, coronary calcium score LDL goal: <70  Labs: LDL 92, HDL 78, Trigs 49, TC 180 (03/01/06 on Crestor 5mg  3x weekly)  Past Medical History:  Diagnosis Date  . Abnormal nuclear stress test    DR. TURNER  . Anxiety and depression   . Bilateral carpal tunnel syndrome 10/14/2018  . BPH (benign prostatic hyperplasia)   . Dyslipidemia   . Elevated fasting glucose   . Hypothyroidism   . Rotator cuff tear, left   . Varicose vein   . Varicose veins     Current Outpatient Medications on File Prior to Visit  Medication Sig Dispense Refill  . ADDERALL XR 25 MG 24 hr capsule Take 25 mg by mouth in the morning and at bedtime.     . ALPRAZolam (XANAX) 0.5 MG tablet Take 0.25 mg by mouth at bedtime as needed for anxiety.     13/11/2018 aspirin EC 81 MG tablet Take 1 tablet (81 mg total) by mouth daily. 90 tablet 3  . cholecalciferol  (VITAMIN D) 1000 units tablet Take 1,000 Units by mouth daily.    . Coenzyme Q10 300 MG CAPS daily.    . diclofenac sodium (VOLTAREN) 1 % GEL Apply 3 grams to 3 large joints up to 3 times daily 3 Tube 3  . DULoxetine (CYMBALTA) 60 MG capsule Take 60 mg by mouth daily.    . Etanercept (ENBREL MINI) 50 MG/ML SOCT INJECT 50 MG INTO THE SKIN ONCE A WEEK. 12 mL 0  . ezetimibe (ZETIA) 10 MG tablet Take 1 tablet (10 mg total) by mouth daily. 90 tablet 1  . FLUoxetine (PROZAC) 20 MG tablet Take 20 mg by mouth daily.     . folic acid (FOLVITE) 1 MG tablet Take 2 tablets (2 mg total) by mouth daily. 180 tablet 3  . gabapentin (NEURONTIN) 300 MG capsule TAKE ONE CAPSULE BY MOUTH AT BEDTIME 90 capsule 0  . GuanFACINE HCl 3 MG TB24 Take 3 mg by mouth daily.    . methotrexate 2.5 MG tablet TAKE SIX TABLETS BY MOUTH ONCE WEEKLY. 72 tablet 0  . Multiple Vitamin (MULTIVITAMIN) capsule Take 1 capsule by mouth daily.    Marland Kitchen PSEUDOEPHEDRINE-ACETAMINOPHEN  PO Take 1 tablet by mouth daily as needed (for congestion).    . rosuvastatin (CRESTOR) 5 MG tablet TAKE ONE TABLET EVERY MONDAY, WEDNESDAY, AND FRIDAY. 38 tablet 2  . SYNTHROID 100 MCG tablet Take 100 mcg by mouth daily before breakfast.     . tamsulosin (FLOMAX) 0.4 MG CAPS capsule Take 0.4 mg by mouth at bedtime.     . traMADol (ULTRAM) 50 MG tablet Take 1 tablet (50 mg total) by mouth every 6 (six) hours as needed for moderate pain. 30 tablet 0  . TURMERIC PO Take 1 tablet by mouth daily.     No current facility-administered medications on file prior to visit.    Allergies  Allergen Reactions  . Methotrexate Other (See Comments)    Patient denies  . Other Other (See Comments)    Assessment/Plan:  1. Hyperlipidemia - Patient LDL 92 which is above goal of <70 on 3x weekly Crestor. Having muscle joint pain, unclear if due to statin or arthritis.    Discussed next options with patient and wife.  Due to coronary calcium score and family history recommended  PCSK9i.  Using Repatha demo pen educated patient on mechanism of action, storage, site selection and administration.  Patient voiced understanding.  Is familiar with self injecting due to history of being on Enbrel.  Patient is agreeable.  Printed copay card and will complete PA.  Will stop rosuvastatin at this time since it may be contributing to muscle weakness and he starts water aerobics next week.  Recheck lipid panel scheduled for August.  Laural Golden, PharmD, BCACP, CDCES, CPP [facility] [address], [place], [place] [address] Phone: [phone]; Fax: [phone] 04/27/2021 4:56 PM

## 2021-04-27 NOTE — Telephone Encounter (Signed)
Called and spoke w/pt's wife and stated that they were approved for repatha rx sent, instructed them they have lab appt fasting 07/18/21 at chst 8 am and they voiced understanding

## 2021-04-28 ENCOUNTER — Telehealth: Payer: Self-pay | Admitting: Pharmacist

## 2021-04-28 DIAGNOSIS — R7303 Prediabetes: Secondary | ICD-10-CM

## 2021-04-28 DIAGNOSIS — F902 Attention-deficit hyperactivity disorder, combined type: Secondary | ICD-10-CM | POA: Diagnosis not present

## 2021-04-28 MED ORDER — GLUCOSE BLOOD VI STRP: ORAL_STRIP | 12 refills | Status: DC

## 2021-04-28 MED ORDER — MICROLET LANCETS MISC: 11 refills | Status: AC

## 2021-04-28 MED ORDER — CONTOUR NEXT MONITOR W/DEVICE KIT: PACK | 0 refills | Status: AC

## 2021-04-28 NOTE — Telephone Encounter (Signed)
Patient plan prefers [employer] test supplies.  Updating Med list

## 2021-05-02 ENCOUNTER — Ambulatory Visit (HOSPITAL_BASED_OUTPATIENT_CLINIC_OR_DEPARTMENT_OTHER): Payer: BC Managed Care – PPO | Admitting: Physical Therapy

## 2021-05-03 ENCOUNTER — Other Ambulatory Visit (HOSPITAL_COMMUNITY): Payer: Self-pay

## 2021-05-05 ENCOUNTER — Other Ambulatory Visit: Payer: Self-pay

## 2021-05-05 DIAGNOSIS — Z79899 Other long term (current) drug therapy: Secondary | ICD-10-CM

## 2021-05-05 DIAGNOSIS — R748 Abnormal levels of other serum enzymes: Secondary | ICD-10-CM | POA: Diagnosis not present

## 2021-05-05 DIAGNOSIS — R5383 Other fatigue: Secondary | ICD-10-CM

## 2021-05-06 LAB — CBC WITH DIFFERENTIAL/PLATELET
Absolute Monocytes: 851 cells/uL (ref 200–950)
Basophils Absolute: 38 cells/uL (ref 0–200)
Basophils Relative: 0.5 %
Eosinophils Absolute: 350 cells/uL (ref 15–500)
Eosinophils Relative: 4.6 %
HCT: 37.6 % — ABNORMAL LOW (ref 38.5–50.0)
Hemoglobin: 12.7 g/dL — ABNORMAL LOW (ref 13.2–17.1)
Lymphs Abs: 1528 cells/uL (ref 850–3900)
MCH: 31.7 pg (ref 27.0–33.0)
MCHC: 33.8 g/dL (ref 32.0–36.0)
MCV: 93.8 fL (ref 80.0–100.0)
MPV: 10.5 fL (ref 7.5–12.5)
Monocytes Relative: 11.2 %
Neutro Abs: 4834 cells/uL (ref 1500–7800)
Neutrophils Relative %: 63.6 %
Platelets: 248 10*3/uL (ref 140–400)
RBC: 4.01 10*6/uL — ABNORMAL LOW (ref 4.20–5.80)
RDW: 12.3 % (ref 11.0–15.0)
Total Lymphocyte: 20.1 %
WBC: 7.6 10*3/uL (ref 3.8–10.8)

## 2021-05-06 LAB — COMPLETE METABOLIC PANEL WITH GFR
AG Ratio: 1.5 (calc) (ref 1.0–2.5)
ALT: 25 U/L (ref 9–46)
AST: 28 U/L (ref 10–35)
Albumin: 4 g/dL (ref 3.6–5.1)
Alkaline phosphatase (APISO): 60 U/L (ref 35–144)
BUN: 22 mg/dL (ref 7–25)
CO2: 27 mmol/L (ref 20–32)
Calcium: 9.6 mg/dL (ref 8.6–10.3)
Chloride: 101 mmol/L (ref 98–110)
Creat: 1.04 mg/dL (ref 0.70–1.25)
GFR, Est African American: 89 mL/min/{1.73_m2} (ref >=60)
GFR, Est Non African American: 77 mL/min/{1.73_m2} (ref >=60)
Globulin: 2.6 g/dL (calc) (ref 1.9–3.7)
Glucose, Bld: 90 mg/dL (ref 65–99)
Potassium: 5.2 mmol/L (ref 3.5–5.3)
Sodium: 137 mmol/L (ref 135–146)
Total Bilirubin: 0.9 mg/dL (ref 0.2–1.2)
Total Protein: 6.6 g/dL (ref 6.1–8.1)

## 2021-05-06 LAB — CK: Total CK: 400 U/L — ABNORMAL HIGH (ref 44–196)

## 2021-05-06 LAB — VITAMIN B12: Vitamin B-12: 1101 pg/mL — ABNORMAL HIGH (ref 200–1100)

## 2021-05-08 NOTE — Progress Notes (Signed)
B12 is mildly elevated. It is ok to take the B12 supplement. Extra B12 is excreted by the kidney and is not harmful.

## 2021-05-08 NOTE — Progress Notes (Signed)
CMP is normal.  Anemia is stable.  CK is at 400 and is stable.  B12 is elevated.

## 2021-05-09 ENCOUNTER — Encounter (HOSPITAL_BASED_OUTPATIENT_CLINIC_OR_DEPARTMENT_OTHER): Payer: Self-pay | Admitting: Physical Therapy

## 2021-05-09 ENCOUNTER — Other Ambulatory Visit: Payer: Self-pay

## 2021-05-09 ENCOUNTER — Ambulatory Visit (HOSPITAL_BASED_OUTPATIENT_CLINIC_OR_DEPARTMENT_OTHER): Payer: BC Managed Care – PPO | Attending: Physician Assistant | Admitting: Physical Therapy

## 2021-05-09 DIAGNOSIS — G8929 Other chronic pain: Secondary | ICD-10-CM | POA: Diagnosis not present

## 2021-05-09 DIAGNOSIS — M25651 Stiffness of right hip, not elsewhere classified: Secondary | ICD-10-CM | POA: Diagnosis not present

## 2021-05-09 DIAGNOSIS — M545 Low back pain, unspecified: Secondary | ICD-10-CM | POA: Diagnosis not present

## 2021-05-09 DIAGNOSIS — M25652 Stiffness of left hip, not elsewhere classified: Secondary | ICD-10-CM | POA: Diagnosis not present

## 2021-05-09 DIAGNOSIS — R2689 Other abnormalities of gait and mobility: Secondary | ICD-10-CM | POA: Insufficient documentation

## 2021-05-09 DIAGNOSIS — F411 Generalized anxiety disorder: Secondary | ICD-10-CM | POA: Diagnosis not present

## 2021-05-09 DIAGNOSIS — F900 Attention-deficit hyperactivity disorder, predominantly inattentive type: Secondary | ICD-10-CM | POA: Diagnosis not present
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
## 2021-05-11 DIAGNOSIS — R002 Palpitations: Secondary | ICD-10-CM | POA: Diagnosis not present

## 2021-05-11 DIAGNOSIS — R000 Tachycardia, unspecified: Secondary | ICD-10-CM | POA: Diagnosis not present

## 2021-05-11 DIAGNOSIS — R42 Dizziness and giddiness: Secondary | ICD-10-CM | POA: Diagnosis not present

## 2021-05-12 DIAGNOSIS — F331 Major depressive disorder, recurrent, moderate: Secondary | ICD-10-CM | POA: Diagnosis not present

## 2021-05-12 DIAGNOSIS — F4321 Adjustment disorder with depressed mood: Secondary | ICD-10-CM | POA: Diagnosis not present

## 2021-05-15 DIAGNOSIS — R002 Palpitations: Secondary | ICD-10-CM | POA: Diagnosis not present

## 2021-05-16 ENCOUNTER — Encounter (HOSPITAL_BASED_OUTPATIENT_CLINIC_OR_DEPARTMENT_OTHER): Payer: Self-pay | Admitting: Physical Therapy

## 2021-05-16 ENCOUNTER — Other Ambulatory Visit: Payer: Self-pay

## 2021-05-16 ENCOUNTER — Ambulatory Visit (HOSPITAL_BASED_OUTPATIENT_CLINIC_OR_DEPARTMENT_OTHER): Payer: BC Managed Care – PPO | Admitting: Physical Therapy

## 2021-05-16 DIAGNOSIS — G8929 Other chronic pain: Secondary | ICD-10-CM

## 2021-05-16 DIAGNOSIS — M545 Low back pain, unspecified: Secondary | ICD-10-CM | POA: Diagnosis not present

## 2021-05-16 DIAGNOSIS — R2689 Other abnormalities of gait and mobility: Secondary | ICD-10-CM

## 2021-05-16 DIAGNOSIS — M25651 Stiffness of right hip, not elsewhere classified: Secondary | ICD-10-CM | POA: Diagnosis not present

## 2021-05-16 DIAGNOSIS — M25652 Stiffness of left hip, not elsewhere classified: Secondary | ICD-10-CM
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
## 2021-05-17 DIAGNOSIS — F411 Generalized anxiety disorder: Secondary | ICD-10-CM | POA: Diagnosis not present

## 2021-05-17 DIAGNOSIS — F900 Attention-deficit hyperactivity disorder, predominantly inattentive type: Secondary | ICD-10-CM | POA: Diagnosis not present

## 2021-05-18 ENCOUNTER — Other Ambulatory Visit: Payer: Self-pay

## 2021-05-18 ENCOUNTER — Ambulatory Visit (INDEPENDENT_AMBULATORY_CARE_PROVIDER_SITE_OTHER): Payer: BC Managed Care – PPO | Admitting: Pharmacist

## 2021-05-18 DIAGNOSIS — R7303 Prediabetes: Secondary | ICD-10-CM

## 2021-05-18 NOTE — Patient Instructions (Addendum)
Utilize Planning meals handout to plan balanced meals.  Increase proteins, healthy fats, vegetables and reduce carbohydrates.  Continue aquatic exercise.  Recheck as needed.

## 2021-05-18 NOTE — Progress Notes (Signed)
Patient ID: Paul [name]                 DOB: 1956-05-18                    MRN: [id number]     HPI: Paul [name] is a 65 y.o. male patient referred to lipid clinic by Dr. Johnsie Cancel. PMH is significant for CAD, RA, and pre DM. Patient was seen at lipid clinic on 04/27/21 and during visit, patient and wife expressed interest in learning healthy eating to prevent DM.  Patient and wife present today in good spirits.  Has started aquatic rehab at [facility] and is enjoying it.  Brought food log and glucometer.  Has significantly reduced carbohydrates in diet.  Home fasting BG levels: 112 108 115 111 127 109 106 97 126  Rheumatologist is having patient restart methotrexate and Enbrel this evening.  Has had 2 doses of Repatha so far and is tolerating well.  Labs: A1c 6.2 on 06/29/20  Past Medical History:  Diagnosis Date   Abnormal nuclear stress test    DR. TURNER   Anxiety and depression    Bilateral carpal tunnel syndrome 10/14/2018   BPH (benign prostatic hyperplasia)    Dyslipidemia    Elevated fasting glucose    Hypothyroidism    Rotator cuff tear, left    Varicose vein    Varicose veins     Current Outpatient Medications on File Prior to Visit  Medication Sig Dispense Refill   ADDERALL XR 25 MG 24 hr capsule Take 25 mg by mouth in the morning and at bedtime.      ALPRAZolam (XANAX) 0.5 MG tablet Take 0.25 mg by mouth at bedtime as needed for anxiety.      aspirin EC 81 MG tablet Take 1 tablet (81 mg total) by mouth daily. 90 tablet 3   Blood Glucose Monitoring Suppl (CONTOUR NEXT MONITOR) w/Device KIT Test blood sugar as needed 1 kit 0   cholecalciferol (VITAMIN D) 1000 units tablet Take 1,000 Units by mouth daily.     Coenzyme Q10 300 MG CAPS daily.     diclofenac sodium (VOLTAREN) 1 % GEL Apply 3 grams to 3 large joints up to 3 times daily 3 Tube 3   DULoxetine (CYMBALTA) 60 MG capsule Take 60 mg by mouth daily.     Etanercept (ENBREL MINI) 50  MG/ML SOCT INJECT 50 MG INTO THE SKIN ONCE A WEEK. 12 mL 0   Evolocumab (REPATHA SURECLICK) 211 MG/ML SOAJ Inject 140 mg into the skin every 14 (fourteen) days. 2 mL 11   FLUoxetine (PROZAC) 20 MG tablet Take 20 mg by mouth daily.      folic acid (FOLVITE) 1 MG tablet Take 2 tablets (2 mg total) by mouth daily. 180 tablet 3   gabapentin (NEURONTIN) 300 MG capsule TAKE ONE CAPSULE BY MOUTH AT BEDTIME 90 capsule 0   glucose blood test strip Test blood sugar as needed 50 each 12   GuanFACINE HCl 3 MG TB24 Take 3 mg by mouth daily.     methotrexate 2.5 MG tablet TAKE SIX TABLETS BY MOUTH ONCE WEEKLY. 72 tablet 0   Microlet Lancets MISC Use to check blood sugar as needed 50 each 11   Multiple Vitamin (MULTIVITAMIN) capsule Take 1 capsule by mouth daily.     PSEUDOEPHEDRINE-ACETAMINOPHEN PO Take 1 tablet by mouth daily as needed (for congestion).     SYNTHROID 100 MCG tablet Take 100  mcg by mouth daily before breakfast.      tamsulosin (FLOMAX) 0.4 MG CAPS capsule Take 0.4 mg by mouth at bedtime.      traMADol (ULTRAM) 50 MG tablet Take 1 tablet (50 mg total) by mouth every 6 (six) hours as needed for moderate pain. 30 tablet 0   TURMERIC PO Take 1 tablet by mouth daily.     No current facility-administered medications on file prior to visit.    Allergies  Allergen Reactions   Methotrexate Other (See Comments)    Patient denies   Other Other (See Comments)    Assessment/Plan:  1. Pre DM - Patient has made significant diet changes since last lipid clinic appointment.  Using Planning Healthy Meals folder explained to patient and wife differences between protein, fat, and carbohydrate metabolism and their impact on blood sugar.  Recommend utlizing the plate method when plannign meals to achieve a healthy balance of vegetables, proteins and carbohydrates.  Explained and gave examples of whole grains.  Discussed differences between unsaturated and saturated fats and recommended avoiding all trans  fats.  Patient and wife voiced understanding.  Recheck as needed.  Karren Cobble, PharmD, BCACP, [place], [place] [address], [place], [place] [address] Phone: [phone]; Fax: [phone] 05/18/2021 5:40 PM

## 2021-05-23 ENCOUNTER — Ambulatory Visit (HOSPITAL_BASED_OUTPATIENT_CLINIC_OR_DEPARTMENT_OTHER): Payer: BC Managed Care – PPO | Admitting: Physical Therapy

## 2021-05-23 ENCOUNTER — Other Ambulatory Visit: Payer: Self-pay

## 2021-05-23 DIAGNOSIS — M25652 Stiffness of left hip, not elsewhere classified: Secondary | ICD-10-CM

## 2021-05-23 DIAGNOSIS — M545 Low back pain, unspecified: Secondary | ICD-10-CM | POA: Diagnosis not present

## 2021-05-23 DIAGNOSIS — M25651 Stiffness of right hip, not elsewhere classified: Secondary | ICD-10-CM

## 2021-05-23 DIAGNOSIS — G8929 Other chronic pain: Secondary | ICD-10-CM

## 2021-05-23 DIAGNOSIS — R2689 Other abnormalities of gait and mobility: Secondary | ICD-10-CM
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
## 2021-05-24 DIAGNOSIS — F900 Attention-deficit hyperactivity disorder, predominantly inattentive type: Secondary | ICD-10-CM | POA: Diagnosis not present

## 2021-05-24 DIAGNOSIS — F411 Generalized anxiety disorder: Secondary | ICD-10-CM | POA: Diagnosis not present

## 2021-05-30 ENCOUNTER — Other Ambulatory Visit (HOSPITAL_COMMUNITY): Payer: Self-pay

## 2021-05-30 DIAGNOSIS — R002 Palpitations: Secondary | ICD-10-CM | POA: Diagnosis not present

## 2021-05-31 DIAGNOSIS — R002 Palpitations: Secondary | ICD-10-CM | POA: Diagnosis not present

## 2021-05-31 DIAGNOSIS — F411 Generalized anxiety disorder: Secondary | ICD-10-CM | POA: Diagnosis not present

## 2021-05-31 DIAGNOSIS — F900 Attention-deficit hyperactivity disorder, predominantly inattentive type: Secondary | ICD-10-CM | POA: Diagnosis not present
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
## 2021-06-06 ENCOUNTER — Other Ambulatory Visit (HOSPITAL_COMMUNITY): Payer: Self-pay

## 2021-06-08 ENCOUNTER — Telehealth: Payer: Self-pay

## 2021-06-08 NOTE — Telephone Encounter (Signed)
Submitted a Prior Authorization request to [facility] for ENBREL via CoverMyMeds. Will update once we receive a response.   Key: [id number]

## 2021-06-09 DIAGNOSIS — F900 Attention-deficit hyperactivity disorder, predominantly inattentive type: Secondary | ICD-10-CM | POA: Diagnosis not present

## 2021-06-09 DIAGNOSIS — F411 Generalized anxiety disorder: Secondary | ICD-10-CM | POA: Diagnosis not present

## 2021-06-12 ENCOUNTER — Other Ambulatory Visit (HOSPITAL_COMMUNITY): Payer: Self-pay

## 2021-06-12 NOTE — Telephone Encounter (Signed)
Received notification from [facility] regarding a prior authorization for ENBREL. Authorization has been APPROVED from 06/08/2021 to 06/07/2022.   Patient can continue to fill through [facility]: [phone]   Authorization # [facility]

## 2021-06-14 DIAGNOSIS — F900 Attention-deficit hyperactivity disorder, predominantly inattentive type: Secondary | ICD-10-CM | POA: Diagnosis not present

## 2021-06-14 DIAGNOSIS — F411 Generalized anxiety disorder: Secondary | ICD-10-CM | POA: Diagnosis not present

## 2021-06-15 ENCOUNTER — Other Ambulatory Visit: Payer: Self-pay

## 2021-06-15 ENCOUNTER — Ambulatory Visit (INDEPENDENT_AMBULATORY_CARE_PROVIDER_SITE_OTHER): Payer: BC Managed Care – PPO | Admitting: Rheumatology

## 2021-06-15 ENCOUNTER — Encounter: Payer: Self-pay | Admitting: Rheumatology

## 2021-06-15 VITALS — BP 104/65 | HR 48 | Ht 72.0 in | Wt 191.6 lb

## 2021-06-15 DIAGNOSIS — R252 Cramp and spasm: Secondary | ICD-10-CM

## 2021-06-15 DIAGNOSIS — Z8639 Personal history of other endocrine, nutritional and metabolic disease: Secondary | ICD-10-CM

## 2021-06-15 DIAGNOSIS — F419 Anxiety disorder, unspecified: Secondary | ICD-10-CM

## 2021-06-15 DIAGNOSIS — M19041 Primary osteoarthritis, right hand: Secondary | ICD-10-CM | POA: Diagnosis not present

## 2021-06-15 DIAGNOSIS — M0600 Rheumatoid arthritis without rheumatoid factor, unspecified site: Secondary | ICD-10-CM

## 2021-06-15 DIAGNOSIS — Z79899 Other long term (current) drug therapy: Secondary | ICD-10-CM | POA: Diagnosis not present

## 2021-06-15 DIAGNOSIS — M19071 Primary osteoarthritis, right ankle and foot: Secondary | ICD-10-CM

## 2021-06-15 DIAGNOSIS — M5030 Other cervical disc degeneration, unspecified cervical region: Secondary | ICD-10-CM

## 2021-06-15 DIAGNOSIS — R748 Abnormal levels of other serum enzymes: Secondary | ICD-10-CM

## 2021-06-15 DIAGNOSIS — Z8679 Personal history of other diseases of the circulatory system: Secondary | ICD-10-CM

## 2021-06-15 DIAGNOSIS — R5383 Other fatigue: Secondary | ICD-10-CM

## 2021-06-15 DIAGNOSIS — M19042 Primary osteoarthritis, left hand: Secondary | ICD-10-CM

## 2021-06-15 DIAGNOSIS — Z8659 Personal history of other mental and behavioral disorders: Secondary | ICD-10-CM

## 2021-06-15 DIAGNOSIS — Z111 Encounter for screening for respiratory tuberculosis: Secondary | ICD-10-CM | POA: Diagnosis not present

## 2021-06-15 DIAGNOSIS — M5136 Other intervertebral disc degeneration, lumbar region: Secondary | ICD-10-CM

## 2021-06-15 DIAGNOSIS — F32A Depression, unspecified: Secondary | ICD-10-CM

## 2021-06-15 DIAGNOSIS — Z87438 Personal history of other diseases of male genital organs: Secondary | ICD-10-CM

## 2021-06-15 DIAGNOSIS — M19072 Primary osteoarthritis, left ankle and foot: Secondary | ICD-10-CM

## 2021-06-15 NOTE — Patient Instructions (Addendum)
Standing Labs We placed an order today for your standing lab work.   Please have your standing labs drawn in September and every 3 months  If possible, please have your labs drawn 2 weeks prior to your appointment so that the provider can discuss your results at your appointment.  Please note that you may see your imaging and lab results in MyChart before we have reviewed them. We may be awaiting multiple results to interpret others before contacting you. Please allow our office up to 72 hours to thoroughly review all of the results before contacting the office for clarification of your results.  We have open lab daily: Monday through Thursday from 1:30-4:30 PM and Friday from 1:30-4:00 PM at the office of Dr. Pollyann Savoy, [facility].   Please be advised, all patients with office appointments requiring lab work will take precedent over walk-in lab work.  If possible, please come for your lab work on Monday and Friday afternoons, as you may experience shorter wait times. The office is located at [address], [address], [place], [place] [address] No appointment is necessary.   Labs are drawn by [facility]. Please bring your co-pay at the time of your lab draw.  You may receive a bill from [facility] for your lab work.  If you wish to have your labs drawn at another location, please call the office 24 hours in advance to send orders.  If you have any questions regarding directions or hours of operation,  please call [phone].   As a reminder, please drink plenty of water prior to coming for your lab work. Thanks!   Vaccines You are taking a medication(s) that can suppress your immune system.  The following immunizations are recommended: Flu annually Covid-19  Td/Tdap (tetanus, diphtheria, pertussis) every 10 years Pneumonia (Prevnar 15 then Pneumovax 23 at least 1 year apart.  Alternatively, can take Prevnar 20 without needing additional dose) Shingrix (after age 63): 2  doses from 4 weeks to 6 months apart  Please check with your PCP to make sure you are up to date.  COVID-19 vaccine recommendations:   COVID-19 vaccine is recommended for everyone (unless you are allergic to a vaccine component), even if you are on a medication that suppresses your immune system.   If you are on Methotrexate, Cellcept (mycophenolate), Rinvoq, Harriette Ohara, and Olumiant- hold the medication for 1 week after each vaccine. Hold Methotrexate for 2 weeks after the single dose COVID-19 vaccine.   If you are on Orencia subcutaneous injection - hold medication one week prior to and one week after the first COVID-19 vaccine dose (only).   If you are on Orencia IV infusions- time vaccination administration so that the first COVID-19 vaccination will occur four weeks after the infusion and postpone the subsequent infusion by one week.   If you are on Cyclophosphamide or Rituxan infusions please contact your doctor prior to receiving the COVID-19 vaccine.   Do not take Tylenol or any anti-inflammatory medications (NSAIDs) 24 hours prior to the COVID-19 vaccination.   There is no direct evidence about the efficacy of the COVID-19 vaccine in individuals who are on medications that suppress the immune system.   Even if you are fully vaccinated, and you are on any medications that suppress your immune system, please continue to wear a mask, maintain at least six feet social distance and practice hand hygiene.   If you develop a COVID-19 infection, please contact your PCP or our office to determine if you  need monoclonal antibody infusion.  The booster vaccine is now available for immunocompromised patients.   Please see the following web sites for updated information.   [email or web address]-Patient-[email or web address]  If you test POSITIVE for COVID19 and have MILD to MODERATE symptoms: First, call your PCP if you would like to receive COVID19  treatment AND Hold your medications during the infection and for at least 1 week after your symptoms have resolved: Injectable medication (Benlysta, Cimzia, Cosentyx, Enbrel, Humira, Orencia, Remicade, Simponi, Stelara, Taltz, Tremfya) Methotrexate Leflunomide (Arava) Mycophenolate (Cellcept) Harriette Ohara, Olumiant, or Rinvoq If you take Actemra or Kevzara, you DO NOT need to hold these for COVID19 infection.  If you test POSITIVE for COVID19 and have NO symptoms: First, call your PCP if you would like to receive COVID19 treatment AND Hold your medications for at least 10 days after the day that you tested positive Injectable medication (Benlysta, Cimzia, Cosentyx, Enbrel, Humira, Orencia, Remicade, Simponi, Stelara, Taltz, Tremfya) Methotrexate Leflunomide (Arava) Mycophenolate (Cellcept) Harriette Ohara, Olumiant, or Rinvoq If you take Actemra or Kevzara, you DO NOT need to hold these for COVID19 infection.  If you have signs or symptoms of an infection or start antibiotics: First, call your PCP for workup of your infection. Hold your medication through the infection, until you complete your antibiotics, and until symptoms resolve if you take the following: Injectable medication (Actemra, Benlysta, Cimzia, Cosentyx, Enbrel, Humira, Kevzara, Orencia, Remicade, Simponi, Stelara, Taltz, Tremfya) Methotrexate Leflunomide (Arava) Mycophenolate (Cellcept) Harriette Ohara, Olumiant, or Rinvoq  Heart Disease Prevention   Your inflammatory disease increases your risk of heart disease which includes heart attack, stroke, atrial fibrillation (irregular heartbeats), high blood pressure, heart failure and atherosclerosis (plaque in the arteries).  It is important to reduce your risk by:   Keep blood pressure, cholesterol, and blood sugar at healthy levels   Smoking Cessation   Maintain a healthy weight  BMI 20-25   Eat a healthy diet  Plenty of fresh fruit, vegetables, and whole grains  Limit saturated fats,  foods high in sodium, and added sugars  DASH and Mediterranean diet   Increase physical activity  Recommend moderate physically activity for 150 minutes per week/ 30 minutes a day for five days a week These can be broken up into three separate ten-minute sessions during the day.   Reduce Stress  Meditation, slow breathing exercises, yoga, coloring books  Dental visits twice a year

## 2021-06-18 LAB — QUANTIFERON-TB GOLD PLUS
Mitogen-NIL: 9.74 IU/mL
NIL: 0.02 IU/mL
QuantiFERON-TB Gold Plus: NEGATIVE
TB1-NIL: 0.01 IU/mL
TB2-NIL: 0.01 IU/mL

## 2021-06-21 DIAGNOSIS — Z0000 Encounter for general adult medical examination without abnormal findings: Secondary | ICD-10-CM | POA: Diagnosis not present

## 2021-06-21 DIAGNOSIS — E039 Hypothyroidism, unspecified: Secondary | ICD-10-CM | POA: Diagnosis not present

## 2021-06-21 DIAGNOSIS — Z13220 Encounter for screening for lipoid disorders: Secondary | ICD-10-CM | POA: Diagnosis not present

## 2021-06-21 DIAGNOSIS — Z125 Encounter for screening for malignant neoplasm of prostate: Secondary | ICD-10-CM | POA: Diagnosis not present

## 2021-06-21 DIAGNOSIS — R7303 Prediabetes: Secondary | ICD-10-CM | POA: Diagnosis not present

## 2021-06-29 DIAGNOSIS — F900 Attention-deficit hyperactivity disorder, predominantly inattentive type: Secondary | ICD-10-CM | POA: Diagnosis not present

## 2021-06-29 DIAGNOSIS — F411 Generalized anxiety disorder: Secondary | ICD-10-CM | POA: Diagnosis not present

## 2021-06-30 ENCOUNTER — Other Ambulatory Visit (HOSPITAL_COMMUNITY): Payer: Self-pay

## 2021-07-03 ENCOUNTER — Other Ambulatory Visit (HOSPITAL_COMMUNITY): Payer: Self-pay

## 2021-07-04 ENCOUNTER — Other Ambulatory Visit: Payer: Self-pay | Admitting: Physician Assistant

## 2021-07-04 DIAGNOSIS — F900 Attention-deficit hyperactivity disorder, predominantly inattentive type: Secondary | ICD-10-CM | POA: Diagnosis not present

## 2021-07-04 DIAGNOSIS — F411 Generalized anxiety disorder: Secondary | ICD-10-CM | POA: Diagnosis not present

## 2021-07-04 NOTE — Telephone Encounter (Signed)
Next Visit: 09/14/2021  Last Visit: 06/15/2021  Last Fill: 04/05/2021  DX: Seronegative rheumatoid arthritis  Current Dose per office note 06/15/2021:  Methotrexate 6 tablets by mouth once weekly  Labs: 05/05/2021, CMP is normal.  Anemia is stable.  CK is at 400 and is stable.  B12 is elevated.  Okay to refill MTX?

## 2021-07-05 ENCOUNTER — Other Ambulatory Visit: Payer: Self-pay

## 2021-07-05 ENCOUNTER — Encounter (HOSPITAL_BASED_OUTPATIENT_CLINIC_OR_DEPARTMENT_OTHER): Payer: Self-pay | Admitting: Internal Medicine

## 2021-07-05 ENCOUNTER — Ambulatory Visit (HOSPITAL_BASED_OUTPATIENT_CLINIC_OR_DEPARTMENT_OTHER): Payer: BC Managed Care – PPO | Admitting: Internal Medicine

## 2021-07-05 VITALS — BP 98/66 | HR 52 | Ht 72.0 in | Wt 190.6 lb

## 2021-07-05 DIAGNOSIS — I471 Supraventricular tachycardia: Secondary | ICD-10-CM
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
## 2021-07-05 NOTE — Patient Instructions (Signed)
Medication Instructions:  Your physician recommends that you continue on your current medications as directed. Please refer to the Current Medication list given to you today.  *If you need a refill on your cardiac medications before your next appointment, please call your pharmacy*   Lab Work: None ordered.  If you have labs (blood work) drawn today and your tests are completely normal, you will receive your results only by: MyChart Message (if you have MyChart) OR A paper copy in the mail If you have any lab test that is abnormal or we need to change your treatment, we will call you to review the results.   Testing/Procedures: None ordered.    Follow-Up: At [facility], you and your health needs are our priority.  As part of our continuing mission to provide you with exceptional heart care, we have created designated Provider Care Teams.  These Care Teams include your primary Cardiologist (physician) and Advanced Practice Providers (APPs -  Physician Assistants and Nurse Practitioners) who all work together to provide you with the care you need, when you need it.  We recommend signing up for the patient portal called "MyChart".  Sign up information is provided on this After Visit Summary.  MyChart is used to connect with patients for Virtual Visits (Telemedicine).  Patients are able to view lab/test results, encounter notes, upcoming appointments, etc.  Non-urgent messages can be sent to your provider as well.   To learn more about what you can do with MyChart, go to [email or web address].    Your next appointment:   6 week(s)  The format for your next appointment:   In Person  Provider:   Hillis Range, MD

## 2021-07-10 ENCOUNTER — Other Ambulatory Visit: Payer: Self-pay | Admitting: Specialist

## 2021-07-12 ENCOUNTER — Telehealth: Payer: Self-pay | Admitting: Pharmacist

## 2021-07-12 DIAGNOSIS — E785 Hyperlipidemia, unspecified: Secondary | ICD-10-CM

## 2021-07-12 NOTE — Telephone Encounter (Signed)
Forgot to place lipid panel order at office visit 3 months ago.  Order placed today

## 2021-07-13 DIAGNOSIS — F900 Attention-deficit hyperactivity disorder, predominantly inattentive type: Secondary | ICD-10-CM | POA: Diagnosis not present

## 2021-07-13 DIAGNOSIS — F411 Generalized anxiety disorder: Secondary | ICD-10-CM | POA: Diagnosis not present

## 2021-07-18 ENCOUNTER — Other Ambulatory Visit: Payer: BC Managed Care – PPO

## 2021-07-19 DIAGNOSIS — F900 Attention-deficit hyperactivity disorder, predominantly inattentive type: Secondary | ICD-10-CM | POA: Diagnosis not present

## 2021-07-19 DIAGNOSIS — F411 Generalized anxiety disorder: Secondary | ICD-10-CM | POA: Diagnosis not present

## 2021-07-19 DIAGNOSIS — Z630 Problems in relationship with spouse or partner: Secondary | ICD-10-CM | POA: Diagnosis not present

## 2021-07-19 IMAGING — CT CT HEAD W/O CM
3 series · 15 of 47 positions shown, 18 images · non-contrast
Comparison: Brain MRI 10/31/2016

CLINICAL DATA: 63-year-old male with 2 days of headache and nausea.
Light sensitivity.

EXAM:
CT HEAD WITHOUT CONTRAST
TECHNIQUE: Contiguous axial images were obtained from the base of the skull
through the vertex without intravenous contrast.

[Series 2: head wo · axial · 0.40mm/px · z∈[+1394,+1529]mm · 9 of 33 slices shown, 12 images]
[im 3/33  brain]
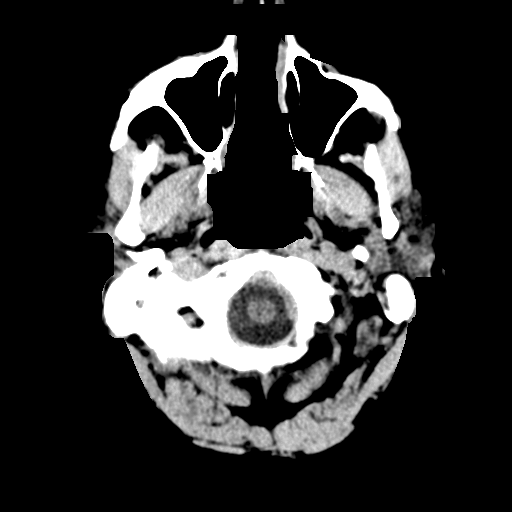
[im 3/33  bone]
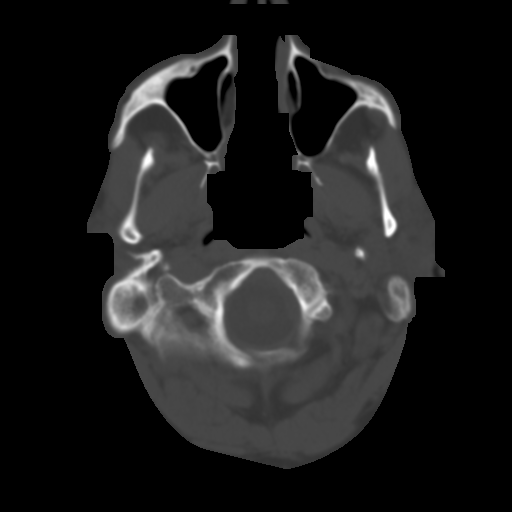
[im 6/33  brain]
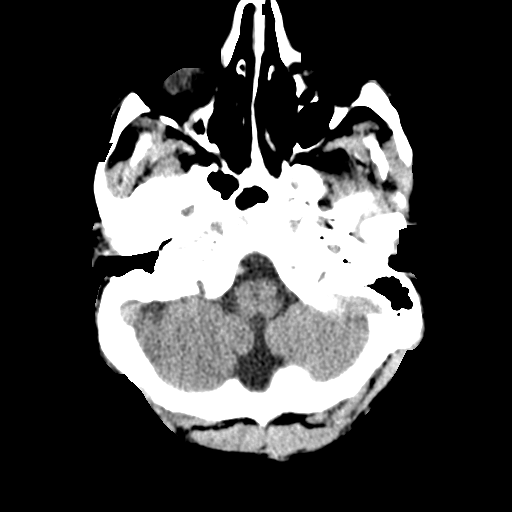
[im 9/33  brain]
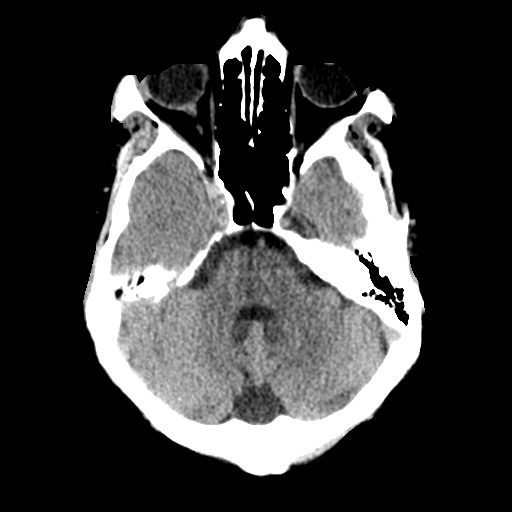
[im 13/33  brain]
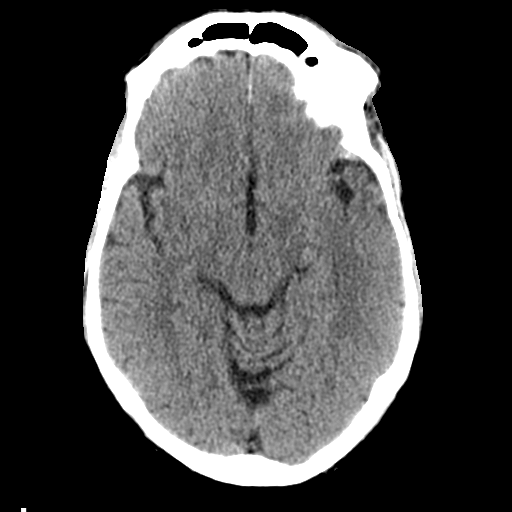
[im 17/33  brain]
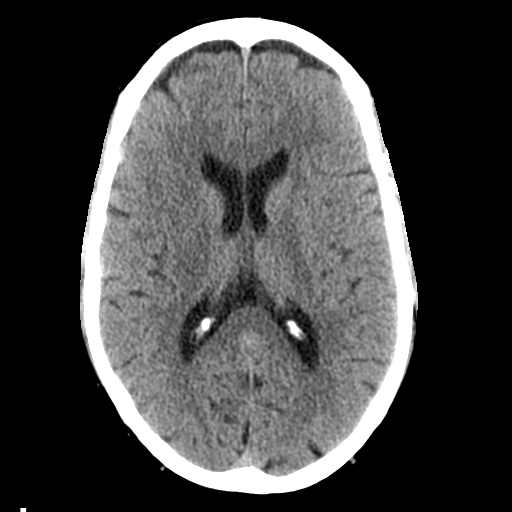
[im 17/33  bone]
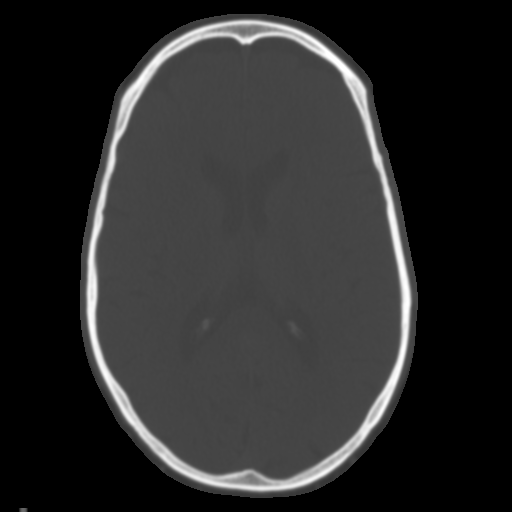
[im 20/33  brain]
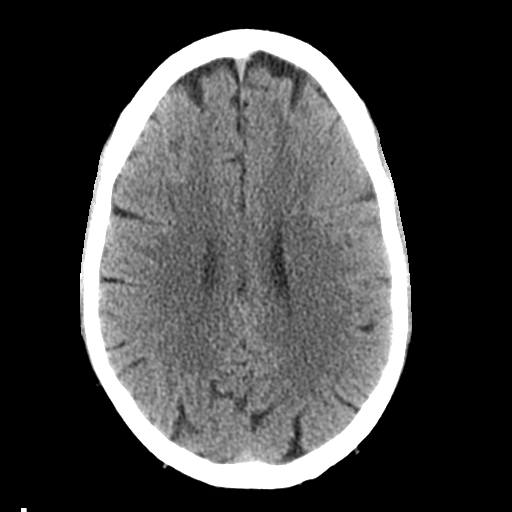
[im 24/33  brain]
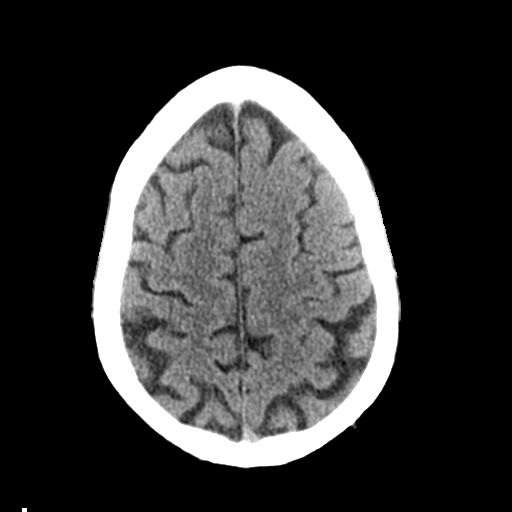
[im 27/33  brain]
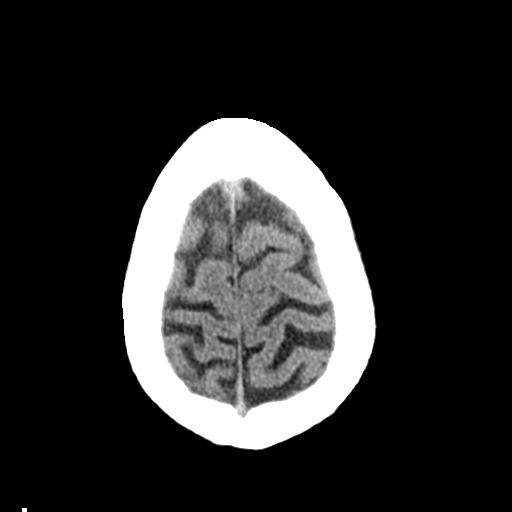
[im 30/33  brain]
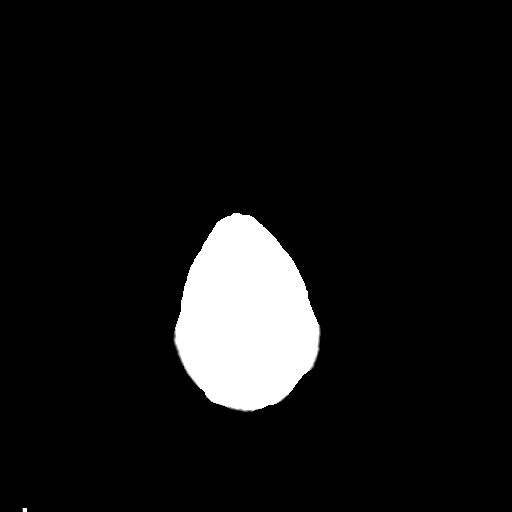
[im 30/33  bone]
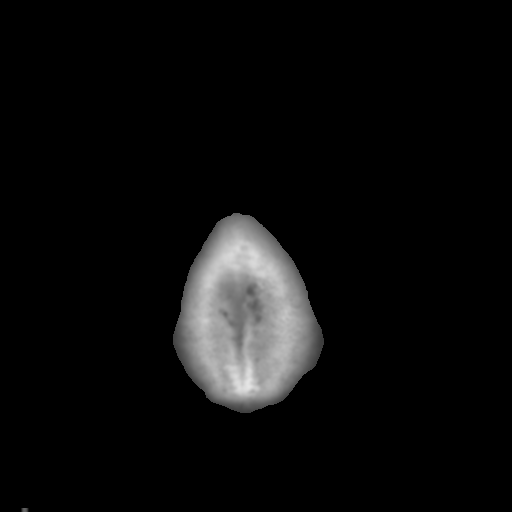

[Series 5: coronal soft tissue · coronal · 0.28mm/px · 3 of 69 slices shown]
[im 23/69  brain]
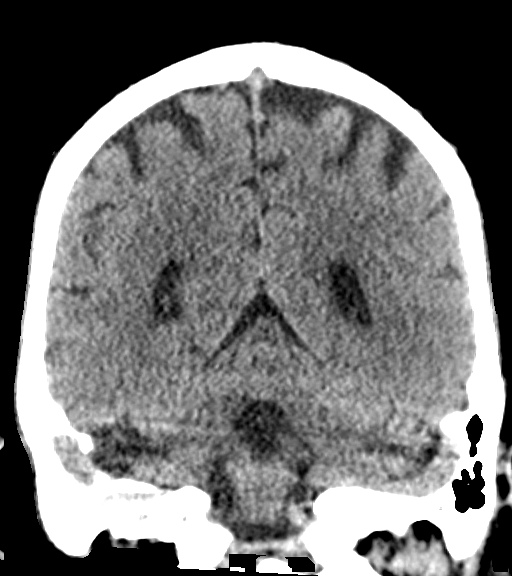
[im 31/69  brain]
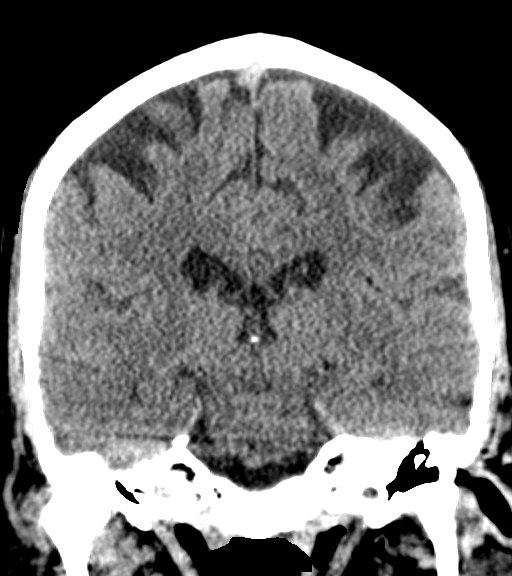
[im 38/69  brain]
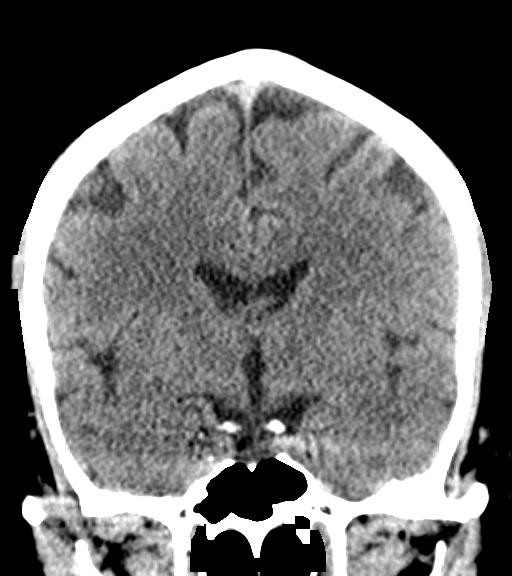

[Series 6: sagittal soft tissue · sagittal · 0.31mm/px · 3 of 48 slices shown]
[im 16/48  brain]
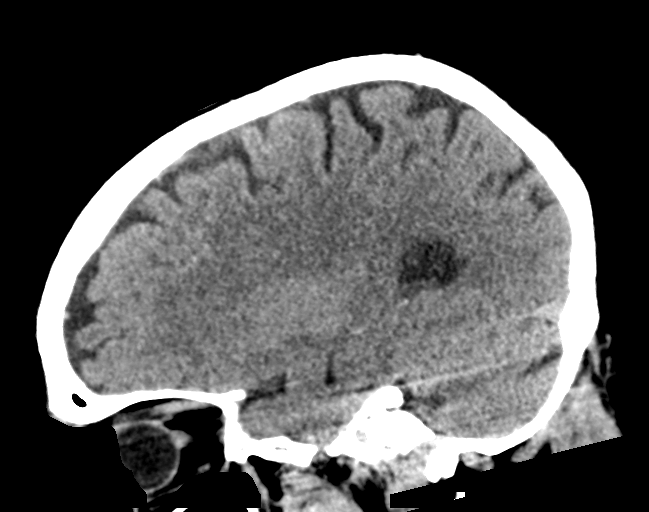
[im 24/48  brain]
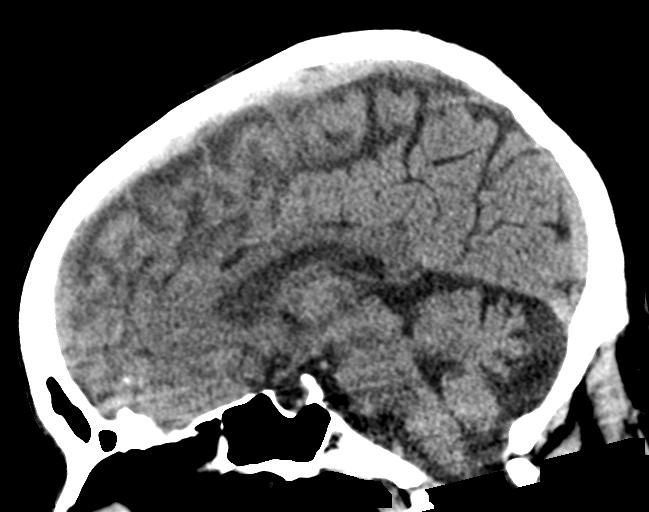
[im 32/48  brain]
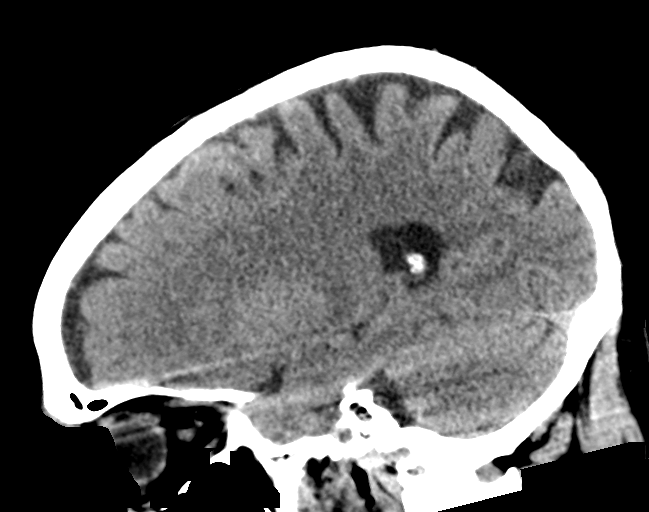

[15 of 47 positions shown; findings below may reference images not displayed]

FINDINGS: Brain: Cerebral volume is stable since 6209, within normal limits
for age. No midline shift, ventriculomegaly, mass effect, evidence
of mass lesion, intracranial hemorrhage or evidence of cortically
based acute infarction. Gray-white matter differentiation is within
normal limits throughout the brain.

Vascular: No suspicious intracranial vascular hyperdensity.

Skull: Negative.

Sinuses/Orbits: Visualized paranasal sinuses and mastoids are clear.

Other: Visualized orbits and scalp soft tissues are within normal
limits.
IMPRESSION: Normal for age non contrast head CT.

## 2021-07-27 ENCOUNTER — Other Ambulatory Visit (HOSPITAL_COMMUNITY): Payer: Self-pay

## 2021-07-27 ENCOUNTER — Other Ambulatory Visit: Payer: Self-pay | Admitting: Physician Assistant

## 2021-07-27 MED ORDER — ENBREL MINI 50 MG/ML ~~LOC~~ SOCT
50.0000 mg | SUBCUTANEOUS | 0 refills | Status: DC
  Filled 2021-07-27: qty 4, 28d supply, fill #0
  Filled 2021-08-24: qty 4, 28d supply, fill #1
  Filled 2021-09-21: qty 4, 28d supply, fill #2

## 2021-07-27 NOTE — Telephone Encounter (Signed)
Next Visit: 09/14/2021   Last Visit: 06/15/2021   Last Fill: 03/15/2021  DX: Seronegative rheumatoid arthritis   Current Dose per office note 06/15/2021:  Labs: 05/05/2021, CMP is normal.  Anemia is stable.  CK is at 400 and is stable.  B12 is elevated.  TB Gold: 06/15/2021 Neg   Okay to refill Enbrel?

## 2021-07-28 DIAGNOSIS — Z79899 Other long term (current) drug therapy: Secondary | ICD-10-CM | POA: Diagnosis not present

## 2021-07-28 DIAGNOSIS — F902 Attention-deficit hyperactivity disorder, combined type: Secondary | ICD-10-CM | POA: Diagnosis not present

## 2021-07-31 ENCOUNTER — Encounter (HOSPITAL_BASED_OUTPATIENT_CLINIC_OR_DEPARTMENT_OTHER): Payer: Self-pay

## 2021-07-31 ENCOUNTER — Other Ambulatory Visit (HOSPITAL_COMMUNITY): Payer: Self-pay

## 2021-08-02 DIAGNOSIS — F411 Generalized anxiety disorder: Secondary | ICD-10-CM | POA: Diagnosis not present

## 2021-08-02 DIAGNOSIS — Z630 Problems in relationship with spouse or partner: Secondary | ICD-10-CM | POA: Diagnosis not present

## 2021-08-02 DIAGNOSIS — F900 Attention-deficit hyperactivity disorder, predominantly inattentive type: Secondary | ICD-10-CM | POA: Diagnosis not present

## 2021-08-03 ENCOUNTER — Other Ambulatory Visit (HOSPITAL_COMMUNITY): Payer: Self-pay

## 2021-08-14 ENCOUNTER — Other Ambulatory Visit: Payer: Self-pay

## 2021-08-14 DIAGNOSIS — R748 Abnormal levels of other serum enzymes: Secondary | ICD-10-CM

## 2021-08-14 DIAGNOSIS — Z79899 Other long term (current) drug therapy: Secondary | ICD-10-CM

## 2021-08-15 DIAGNOSIS — F410 Panic disorder [episodic paroxysmal anxiety] without agoraphobia: Secondary | ICD-10-CM | POA: Diagnosis not present

## 2021-08-15 DIAGNOSIS — F900 Attention-deficit hyperactivity disorder, predominantly inattentive type: Secondary | ICD-10-CM | POA: Diagnosis not present

## 2021-08-15 DIAGNOSIS — F3341 Major depressive disorder, recurrent, in partial remission: Secondary | ICD-10-CM | POA: Diagnosis not present

## 2021-08-15 LAB — CBC WITH DIFFERENTIAL/PLATELET
Absolute Monocytes: 850 cells/uL (ref 200–950)
Basophils Absolute: 58 cells/uL (ref 0–200)
Basophils Relative: 0.8 %
Eosinophils Absolute: 187 cells/uL (ref 15–500)
Eosinophils Relative: 2.6 %
HCT: 36.9 % — ABNORMAL LOW (ref 38.5–50.0)
Hemoglobin: 12.8 g/dL — ABNORMAL LOW (ref 13.2–17.1)
Lymphs Abs: 1750 cells/uL (ref 850–3900)
MCH: 32.7 pg (ref 27.0–33.0)
MCHC: 34.7 g/dL (ref 32.0–36.0)
MCV: 94.4 fL (ref 80.0–100.0)
MPV: 10.1 fL (ref 7.5–12.5)
Monocytes Relative: 11.8 %
Neutro Abs: 4356 cells/uL (ref 1500–7800)
Neutrophils Relative %: 60.5 %
Platelets: 266 10*3/uL (ref 140–400)
RBC: 3.91 10*6/uL — ABNORMAL LOW (ref 4.20–5.80)
RDW: 13.3 % (ref 11.0–15.0)
Total Lymphocyte: 24.3 %
WBC: 7.2 10*3/uL (ref 3.8–10.8)

## 2021-08-15 LAB — COMPLETE METABOLIC PANEL WITH GFR
AG Ratio: 1.6 (calc) (ref 1.0–2.5)
ALT: 25 U/L (ref 9–46)
AST: 26 U/L (ref 10–35)
Albumin: 4.1 g/dL (ref 3.6–5.1)
Alkaline phosphatase (APISO): 56 U/L (ref 35–144)
BUN: 23 mg/dL (ref 7–25)
CO2: 30 mmol/L (ref 20–32)
Calcium: 9.6 mg/dL (ref 8.6–10.3)
Chloride: 103 mmol/L (ref 98–110)
Creat: 1.04 mg/dL (ref 0.70–1.35)
Globulin: 2.6 g/dL (calc) (ref 1.9–3.7)
Glucose, Bld: 119 mg/dL — ABNORMAL HIGH (ref 65–99)
Potassium: 5.2 mmol/L (ref 3.5–5.3)
Sodium: 138 mmol/L (ref 135–146)
Total Bilirubin: 1.1 mg/dL (ref 0.2–1.2)
Total Protein: 6.7 g/dL (ref 6.1–8.1)
eGFR: 81 mL/min/{1.73_m2} (ref >=60)

## 2021-08-15 LAB — CK: Total CK: 239 U/L — ABNORMAL HIGH (ref 44–196)

## 2021-08-15 NOTE — Progress Notes (Signed)
CK remains slightly elevated but has improved-239.  We will continue to monitor CK every 3 months.

## 2021-08-15 NOTE — Progress Notes (Signed)
Glucose is mildly elevated probably not a fasting sample.  Hemoglobin is low and stable.

## 2021-08-17 ENCOUNTER — Ambulatory Visit (HOSPITAL_BASED_OUTPATIENT_CLINIC_OR_DEPARTMENT_OTHER): Payer: BC Managed Care – PPO | Admitting: Internal Medicine

## 2021-08-17 ENCOUNTER — Other Ambulatory Visit: Payer: Self-pay

## 2021-08-17 VITALS — BP 108/62 | HR 58 | Ht 72.0 in | Wt 185.0 lb

## 2021-08-17 DIAGNOSIS — I471 Supraventricular tachycardia: Secondary | ICD-10-CM
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
## 2021-08-17 NOTE — Patient Instructions (Addendum)
Medication Instructions:  Your physician recommends that you continue on your current medications as directed. Please refer to the Current Medication list given to you today.  Labwork: None ordered.  Testing/Procedures: None ordered.  Follow-Up: Your physician wants you to follow-up in: 6 months with  one of the following Advanced Practice Providers on your designated Care Team:     Michael "Andy" Tillery, PA-C   You will receive a reminder letter in the mail two months in advance. If you don't receive a letter, please call our office to schedule the follow-up appointment.   Any Other Special Instructions Will Be Listed Below (If Applicable).  If you need a refill on your cardiac medications before your next appointment, please call your pharmacy.        

## 2021-08-18 DIAGNOSIS — Z630 Problems in relationship with spouse or partner: Secondary | ICD-10-CM | POA: Diagnosis not present

## 2021-08-18 DIAGNOSIS — F411 Generalized anxiety disorder: Secondary | ICD-10-CM | POA: Diagnosis not present

## 2021-08-18 DIAGNOSIS — F900 Attention-deficit hyperactivity disorder, predominantly inattentive type: Secondary | ICD-10-CM | POA: Diagnosis not present

## 2021-08-23 DIAGNOSIS — F411 Generalized anxiety disorder: Secondary | ICD-10-CM | POA: Diagnosis not present

## 2021-08-23 DIAGNOSIS — F900 Attention-deficit hyperactivity disorder, predominantly inattentive type: Secondary | ICD-10-CM | POA: Diagnosis not present

## 2021-08-23 DIAGNOSIS — Z630 Problems in relationship with spouse or partner: Secondary | ICD-10-CM | POA: Diagnosis not present

## 2021-08-24 ENCOUNTER — Other Ambulatory Visit (HOSPITAL_COMMUNITY): Payer: Self-pay

## 2021-08-28 ENCOUNTER — Other Ambulatory Visit (HOSPITAL_COMMUNITY): Payer: Self-pay

## 2021-08-30 DIAGNOSIS — F900 Attention-deficit hyperactivity disorder, predominantly inattentive type: Secondary | ICD-10-CM | POA: Diagnosis not present

## 2021-08-30 DIAGNOSIS — Z630 Problems in relationship with spouse or partner: Secondary | ICD-10-CM | POA: Diagnosis not present

## 2021-08-30 DIAGNOSIS — F411 Generalized anxiety disorder: Secondary | ICD-10-CM | POA: Diagnosis not present
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
## 2021-09-06 DIAGNOSIS — F411 Generalized anxiety disorder: Secondary | ICD-10-CM | POA: Diagnosis not present

## 2021-09-06 DIAGNOSIS — Z630 Problems in relationship with spouse or partner: Secondary | ICD-10-CM | POA: Diagnosis not present

## 2021-09-06 DIAGNOSIS — F900 Attention-deficit hyperactivity disorder, predominantly inattentive type: Secondary | ICD-10-CM | POA: Diagnosis not present

## 2021-09-13 ENCOUNTER — Telehealth (HOSPITAL_BASED_OUTPATIENT_CLINIC_OR_DEPARTMENT_OTHER): Payer: Self-pay | Admitting: Pharmacist

## 2021-09-13 DIAGNOSIS — F900 Attention-deficit hyperactivity disorder, predominantly inattentive type: Secondary | ICD-10-CM | POA: Diagnosis not present

## 2021-09-13 DIAGNOSIS — F411 Generalized anxiety disorder: Secondary | ICD-10-CM | POA: Diagnosis not present

## 2021-09-13 DIAGNOSIS — Z630 Problems in relationship with spouse or partner: Secondary | ICD-10-CM | POA: Diagnosis not present

## 2021-09-13 NOTE — Telephone Encounter (Signed)
Medication reconciliation completed at [facility].   Theodis Sato, PharmD Clinical Pharmacist [facility]  09/13/2021 1:05 PM

## 2021-09-14 ENCOUNTER — Other Ambulatory Visit: Payer: Self-pay

## 2021-09-14 ENCOUNTER — Ambulatory Visit: Payer: BC Managed Care – PPO | Admitting: Physician Assistant

## 2021-09-14 ENCOUNTER — Encounter: Payer: Self-pay | Admitting: Physician Assistant

## 2021-09-14 VITALS — BP 112/69 | HR 52 | Ht 72.0 in | Wt 184.4 lb

## 2021-09-14 DIAGNOSIS — F419 Anxiety disorder, unspecified: Secondary | ICD-10-CM

## 2021-09-14 DIAGNOSIS — M19071 Primary osteoarthritis, right ankle and foot: Secondary | ICD-10-CM

## 2021-09-14 DIAGNOSIS — F32A Depression, unspecified: Secondary | ICD-10-CM

## 2021-09-14 DIAGNOSIS — Z87438 Personal history of other diseases of male genital organs: Secondary | ICD-10-CM

## 2021-09-14 DIAGNOSIS — M19072 Primary osteoarthritis, left ankle and foot: Secondary | ICD-10-CM

## 2021-09-14 DIAGNOSIS — R5383 Other fatigue: Secondary | ICD-10-CM

## 2021-09-14 DIAGNOSIS — R748 Abnormal levels of other serum enzymes: Secondary | ICD-10-CM

## 2021-09-14 DIAGNOSIS — M5030 Other cervical disc degeneration, unspecified cervical region: Secondary | ICD-10-CM

## 2021-09-14 DIAGNOSIS — Z8659 Personal history of other mental and behavioral disorders: Secondary | ICD-10-CM

## 2021-09-14 DIAGNOSIS — M0600 Rheumatoid arthritis without rheumatoid factor, unspecified site: Secondary | ICD-10-CM | POA: Diagnosis not present

## 2021-09-14 DIAGNOSIS — Z8639 Personal history of other endocrine, nutritional and metabolic disease: Secondary | ICD-10-CM

## 2021-09-14 DIAGNOSIS — M19041 Primary osteoarthritis, right hand: Secondary | ICD-10-CM | POA: Diagnosis not present

## 2021-09-14 DIAGNOSIS — M19042 Primary osteoarthritis, left hand: Secondary | ICD-10-CM

## 2021-09-14 DIAGNOSIS — Z8679 Personal history of other diseases of the circulatory system: Secondary | ICD-10-CM

## 2021-09-14 DIAGNOSIS — R252 Cramp and spasm: Secondary | ICD-10-CM

## 2021-09-14 DIAGNOSIS — M5136 Other intervertebral disc degeneration, lumbar region: Secondary | ICD-10-CM

## 2021-09-14 DIAGNOSIS — Z79899 Other long term (current) drug therapy: Secondary | ICD-10-CM

## 2021-09-14 NOTE — Patient Instructions (Signed)
Standing Labs We placed an order today for your standing lab work.   Please have your standing labs drawn in December and every 3 months    If possible, please have your labs drawn 2 weeks prior to your appointment so that the provider can discuss your results at your appointment.  Please note that you may see your imaging and lab results in MyChart before we have reviewed them. We may be awaiting multiple results to interpret others before contacting you. Please allow our office up to 72 hours to thoroughly review all of the results before contacting the office for clarification of your results.  We have open lab daily: Monday through Thursday from 1:30-4:30 PM and Friday from 1:30-4:00 PM at the office of Dr. Shaili Deveshwar, [facility].   Please be advised, all patients with office appointments requiring lab work will take precedent over walk-in lab work.  If possible, please come for your lab work on Monday and Friday afternoons, as you may experience shorter wait times. The office is located at [address], [address], [place], [place] [address] No appointment is necessary.   Labs are drawn by [facility]. Please bring your co-pay at the time of your lab draw.  You may receive a bill from [facility] for your lab work.  If you wish to have your labs drawn at another location, please call the office 24 hours in advance to send orders.  If you have any questions regarding directions or hours of operation,  please call [phone].   As a reminder, please drink plenty of water prior to coming for your lab work. Thanks!  

## 2021-09-15 ENCOUNTER — Other Ambulatory Visit (HOSPITAL_BASED_OUTPATIENT_CLINIC_OR_DEPARTMENT_OTHER): Payer: Self-pay

## 2021-09-18 ENCOUNTER — Other Ambulatory Visit: Payer: Self-pay | Admitting: Specialist

## 2021-09-18 ENCOUNTER — Other Ambulatory Visit: Payer: Self-pay | Admitting: Rheumatology

## 2021-09-18 DIAGNOSIS — F411 Generalized anxiety disorder: Secondary | ICD-10-CM | POA: Diagnosis not present

## 2021-09-18 DIAGNOSIS — F900 Attention-deficit hyperactivity disorder, predominantly inattentive type: Secondary | ICD-10-CM | POA: Diagnosis not present

## 2021-09-18 DIAGNOSIS — Z630 Problems in relationship with spouse or partner: Secondary | ICD-10-CM | POA: Diagnosis not present

## 2021-09-18 NOTE — Telephone Encounter (Signed)
Next Visit: 12/14/2021  Last Visit: 09/14/2021  Last Fill: 07/04/2021  DX: Seronegative rheumatoid arthritis   Current Dose per office note on 09/14/2021: Methotrexate 6 tablets by mouth once weekly  Labs: 08/14/2021 Glucose is mildly elevated probably not a fasting sample.  Hemoglobin is low and stable.  Okay to refill methotrexate?

## 2021-09-21 ENCOUNTER — Other Ambulatory Visit (HOSPITAL_COMMUNITY): Payer: Self-pay

## 2021-09-25 ENCOUNTER — Other Ambulatory Visit (HOSPITAL_COMMUNITY): Payer: Self-pay

## 2021-09-26 ENCOUNTER — Telehealth: Payer: Self-pay | Admitting: Rheumatology

## 2021-09-26 NOTE — Telephone Encounter (Signed)
I reviewed Paul [name]'s note from April 2022.  Patient was experiencing fatigue and had anemia.  You may use the code for anemia for B12 and iron studies.  I do not have any other suggestion besides fatigue for vitamin D test .

## 2021-09-26 NOTE — Telephone Encounter (Signed)
[employer] calling in reference to [facility] 03/09/2021. [facility] labs drawn. Per [employer] services rendered cannot be paid with dx code [phone] fatique,  R748 abnormal levels of serum enzymes. Labs drawn were Iron, Vit D, And Vit B.  Please contact [facility] with new diagnosis, and request they refile.

## 2021-09-27 DIAGNOSIS — Z630 Problems in relationship with spouse or partner: Secondary | ICD-10-CM | POA: Diagnosis not present

## 2021-09-27 DIAGNOSIS — F411 Generalized anxiety disorder: Secondary | ICD-10-CM | POA: Diagnosis not present

## 2021-09-27 DIAGNOSIS — F900 Attention-deficit hyperactivity disorder, predominantly inattentive type: Secondary | ICD-10-CM | POA: Diagnosis not present

## 2021-09-27 NOTE — Telephone Encounter (Signed)
I called [facility] at [phone], on hold for 56 minutes, added ICD10 codes D51.9 and E61.1, bill resubmitted, may take 30 days for review.

## 2021-09-28 NOTE — Telephone Encounter (Signed)
Patient's wife advised we submitted additional diagnosis codes. Patient's wife advised we have submitted all of the codes we can. Advised if they are unable to process or cover test with those codes there will be nothing else we can add.

## 2021-10-03 ENCOUNTER — Other Ambulatory Visit: Payer: Self-pay | Admitting: Physician Assistant

## 2021-10-05 DIAGNOSIS — F411 Generalized anxiety disorder: Secondary | ICD-10-CM | POA: Diagnosis not present

## 2021-10-05 DIAGNOSIS — F900 Attention-deficit hyperactivity disorder, predominantly inattentive type: Secondary | ICD-10-CM | POA: Diagnosis not present

## 2021-10-05 DIAGNOSIS — Z630 Problems in relationship with spouse or partner: Secondary | ICD-10-CM | POA: Diagnosis not present

## 2021-10-06 ENCOUNTER — Other Ambulatory Visit: Payer: Self-pay | Admitting: Physician Assistant

## 2021-10-09 ENCOUNTER — Other Ambulatory Visit: Payer: Self-pay | Admitting: Specialist

## 2021-10-11 DIAGNOSIS — F411 Generalized anxiety disorder: Secondary | ICD-10-CM | POA: Diagnosis not present

## 2021-10-11 DIAGNOSIS — Z630 Problems in relationship with spouse or partner: Secondary | ICD-10-CM | POA: Diagnosis not present

## 2021-10-11 DIAGNOSIS — F900 Attention-deficit hyperactivity disorder, predominantly inattentive type: Secondary | ICD-10-CM | POA: Diagnosis not present

## 2021-10-19 DIAGNOSIS — F900 Attention-deficit hyperactivity disorder, predominantly inattentive type: Secondary | ICD-10-CM | POA: Diagnosis not present

## 2021-10-19 DIAGNOSIS — F411 Generalized anxiety disorder: Secondary | ICD-10-CM | POA: Diagnosis not present

## 2021-10-19 DIAGNOSIS — Z630 Problems in relationship with spouse or partner: Secondary | ICD-10-CM | POA: Diagnosis not present

## 2021-10-23 ENCOUNTER — Other Ambulatory Visit (HOSPITAL_COMMUNITY): Payer: Self-pay

## 2021-10-25 ENCOUNTER — Other Ambulatory Visit: Payer: Self-pay | Admitting: Physician Assistant

## 2021-10-25 ENCOUNTER — Other Ambulatory Visit (HOSPITAL_COMMUNITY): Payer: Self-pay

## 2021-10-25 MED ORDER — ENBREL MINI 50 MG/ML ~~LOC~~ SOCT
50.0000 mg | SUBCUTANEOUS | 0 refills | Status: DC
  Filled 2021-10-25: qty 4, 28d supply, fill #0
  Filled 2021-12-11 (×2): qty 4, 28d supply, fill #1
  Filled 2021-12-29: qty 4, 28d supply, fill #2

## 2021-10-25 NOTE — Telephone Encounter (Signed)
Next Visit: 12/14/2021   Last Visit: 09/14/2021   Last Fill: 07/27/2021  DX: Seronegative rheumatoid arthritis    Current Dose per office note on 09/14/2021:  Labs: 08/14/2021 Glucose is mildly elevated probably not a fasting sample.  Hemoglobin is low and stable.  TB Gold: 06/15/2021 Neg   Okay to refill Enbrel?

## 2021-10-30 DIAGNOSIS — M2540 Effusion, unspecified joint: Secondary | ICD-10-CM | POA: Diagnosis not present

## 2021-10-30 DIAGNOSIS — L03114 Cellulitis of left upper limb: Secondary | ICD-10-CM | POA: Diagnosis not present

## 2021-10-31 ENCOUNTER — Other Ambulatory Visit (HOSPITAL_COMMUNITY): Payer: Self-pay

## 2021-11-01 ENCOUNTER — Inpatient Hospital Stay
Admission: AD | Admit: 2021-11-01 | Payer: BC Managed Care – PPO | Source: Ambulatory Visit | Admitting: Internal Medicine

## 2021-11-01 DIAGNOSIS — F411 Generalized anxiety disorder: Secondary | ICD-10-CM | POA: Diagnosis not present

## 2021-11-01 DIAGNOSIS — L03114 Cellulitis of left upper limb: Secondary | ICD-10-CM | POA: Diagnosis not present

## 2021-11-01 DIAGNOSIS — Z630 Problems in relationship with spouse or partner: Secondary | ICD-10-CM | POA: Diagnosis not present

## 2021-11-01 DIAGNOSIS — N179 Acute kidney failure, unspecified: Secondary | ICD-10-CM | POA: Diagnosis not present

## 2021-11-01 DIAGNOSIS — F900 Attention-deficit hyperactivity disorder, predominantly inattentive type: Secondary | ICD-10-CM | POA: Diagnosis not present

## 2021-11-03 ENCOUNTER — Telehealth: Payer: Self-pay

## 2021-11-03 NOTE — Telephone Encounter (Signed)
At the request of Dr. Isidoro Donning with [facility] I contacted patient [name] about his pending admission for cellulitis. [name] stated that his hand has greatly improved and that he felt he did not need to be admitted for treatment for the cellulitis. Patient stated that he spoke with Paul Scale, NP at [facility] [place] about his improvement and requested to not be admitted. Paul [name] at [place] and confirmed that information with Paul [name], CMA for Paul Moore, NP. Based on this information [name] will be removed from our list for a direct admit to [facility].     Paul Covey, RN  Patient Placement RN [facility]

## 2021-11-06 ENCOUNTER — Other Ambulatory Visit (HOSPITAL_COMMUNITY): Payer: Self-pay

## 2021-11-08 DIAGNOSIS — F4323 Adjustment disorder with mixed anxiety and depressed mood: Secondary | ICD-10-CM | POA: Diagnosis not present

## 2021-11-08 DIAGNOSIS — F900 Attention-deficit hyperactivity disorder, predominantly inattentive type: Secondary | ICD-10-CM | POA: Diagnosis not present

## 2021-11-09 ENCOUNTER — Telehealth: Payer: Self-pay

## 2021-11-09 ENCOUNTER — Other Ambulatory Visit: Payer: Self-pay | Admitting: *Deleted

## 2021-11-09 DIAGNOSIS — Z79899 Other long term (current) drug therapy: Secondary | ICD-10-CM

## 2021-11-09 DIAGNOSIS — R748 Abnormal levels of other serum enzymes: Secondary | ICD-10-CM

## 2021-11-09 NOTE — Telephone Encounter (Signed)
Lab Order faxed

## 2021-11-09 NOTE — Telephone Encounter (Signed)
Patient called requesting his labwork orders be sent to Dr. Venita Sheffield office at [place].  Patient states he plans to go on 11/16/21.

## 2021-11-14 DIAGNOSIS — F3341 Major depressive disorder, recurrent, in partial remission: Secondary | ICD-10-CM | POA: Diagnosis not present

## 2021-11-14 DIAGNOSIS — F410 Panic disorder [episodic paroxysmal anxiety] without agoraphobia: Secondary | ICD-10-CM | POA: Diagnosis not present

## 2021-11-14 DIAGNOSIS — L0390 Cellulitis, unspecified: Secondary | ICD-10-CM | POA: Diagnosis not present

## 2021-11-14 DIAGNOSIS — M0600 Rheumatoid arthritis without rheumatoid factor, unspecified site: Secondary | ICD-10-CM | POA: Diagnosis not present

## 2021-11-14 DIAGNOSIS — F900 Attention-deficit hyperactivity disorder, predominantly inattentive type: Secondary | ICD-10-CM | POA: Diagnosis not present

## 2021-11-16 ENCOUNTER — Telehealth: Payer: Self-pay | Admitting: *Deleted

## 2021-11-16 NOTE — Telephone Encounter (Signed)
Labs received from:[facility] on:11/14/2021   Reviewed by: Dr. Pollyann Savoy  Labs drawn:CBC, CMP, CK  Results:RBC 4.07  Hct. 38.6  MCV 95.0  MO% 13.5  MO# 1.0  Glucose 101  T. Bil 1.2   Patient is on  Enbrel 50 mg SQ once weekly, Gabapentin 300 mg q hs and MTX 6 tabs po weekly.

## 2021-11-17 DIAGNOSIS — L308 Other specified dermatitis: Secondary | ICD-10-CM | POA: Diagnosis not present

## 2021-11-17 DIAGNOSIS — L821 Other seborrheic keratosis: Secondary | ICD-10-CM | POA: Diagnosis not present

## 2021-11-17 DIAGNOSIS — Z630 Problems in relationship with spouse or partner: Secondary | ICD-10-CM | POA: Diagnosis not present

## 2021-11-17 DIAGNOSIS — D1801 Hemangioma of skin and subcutaneous tissue: Secondary | ICD-10-CM | POA: Diagnosis not present

## 2021-11-17 DIAGNOSIS — D225 Melanocytic nevi of trunk: Secondary | ICD-10-CM | POA: Diagnosis not present

## 2021-11-17 DIAGNOSIS — F411 Generalized anxiety disorder: Secondary | ICD-10-CM | POA: Diagnosis not present

## 2021-11-17 DIAGNOSIS — F900 Attention-deficit hyperactivity disorder, predominantly inattentive type: Secondary | ICD-10-CM | POA: Diagnosis not present

## 2021-11-21 ENCOUNTER — Other Ambulatory Visit (HOSPITAL_COMMUNITY): Payer: Self-pay

## 2021-11-22 DIAGNOSIS — F900 Attention-deficit hyperactivity disorder, predominantly inattentive type: Secondary | ICD-10-CM | POA: Diagnosis not present

## 2021-11-22 DIAGNOSIS — Z630 Problems in relationship with spouse or partner: Secondary | ICD-10-CM | POA: Diagnosis not present

## 2021-11-22 DIAGNOSIS — F411 Generalized anxiety disorder: Secondary | ICD-10-CM | POA: Diagnosis not present

## 2021-11-23 ENCOUNTER — Other Ambulatory Visit (HOSPITAL_COMMUNITY): Payer: Self-pay
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
## 2021-12-06 DIAGNOSIS — F900 Attention-deficit hyperactivity disorder, predominantly inattentive type: Secondary | ICD-10-CM | POA: Diagnosis not present

## 2021-12-06 DIAGNOSIS — Z630 Problems in relationship with spouse or partner: Secondary | ICD-10-CM | POA: Diagnosis not present

## 2021-12-07 ENCOUNTER — Telehealth: Payer: Self-pay

## 2021-12-07 ENCOUNTER — Other Ambulatory Visit (HOSPITAL_COMMUNITY): Payer: Self-pay

## 2021-12-07 NOTE — Telephone Encounter (Signed)
Lmom to schedule lipid lab

## 2021-12-08 ENCOUNTER — Telehealth: Payer: Self-pay | Admitting: Cardiovascular Disease

## 2021-12-08 ENCOUNTER — Other Ambulatory Visit (HOSPITAL_COMMUNITY): Payer: Self-pay

## 2021-12-08 NOTE — Telephone Encounter (Signed)
Pa submitted Paul [name] (Key: [id number]) Repatha SureClick 140MG /ML auto-injectors I had also called yesterday to schedule labs for lipid Found an ldl of 46 on kpn on 06/07/21 Called and spoke w/pt's wife and stated that we sent in a prior authorization. They voiced gratitude and understanding

## 2021-12-08 NOTE — Telephone Encounter (Signed)
New Message:     Pharmacist called and said they need a prior authorization for patient's Repatha.

## 2021-12-11 ENCOUNTER — Other Ambulatory Visit (HOSPITAL_COMMUNITY): Payer: Self-pay

## 2021-12-12 ENCOUNTER — Other Ambulatory Visit (HOSPITAL_COMMUNITY): Payer: Self-pay

## 2021-12-12 ENCOUNTER — Telehealth: Payer: Self-pay

## 2021-12-12 NOTE — Telephone Encounter (Signed)
[facility] indicated that pt needed new PA for Enbrel. Previous PA for enbrel was good through 7/23.  Received notification from [facility] regarding a prior authorization for ENBREL. Authorization has been APPROVED from 12/11/2021 to 12/10/2022.    Authorization # [id number]

## 2021-12-13 ENCOUNTER — Other Ambulatory Visit (HOSPITAL_COMMUNITY): Payer: Self-pay

## 2021-12-13 DIAGNOSIS — Z630 Problems in relationship with spouse or partner: Secondary | ICD-10-CM | POA: Diagnosis not present

## 2021-12-13 DIAGNOSIS — F900 Attention-deficit hyperactivity disorder, predominantly inattentive type: Secondary | ICD-10-CM | POA: Diagnosis not present

## 2021-12-14 ENCOUNTER — Other Ambulatory Visit (HOSPITAL_COMMUNITY): Payer: Self-pay

## 2021-12-14 ENCOUNTER — Ambulatory Visit: Payer: BC Managed Care – PPO | Admitting: Rheumatology

## 2021-12-14 ENCOUNTER — Other Ambulatory Visit: Payer: Self-pay | Admitting: *Deleted

## 2021-12-14 ENCOUNTER — Other Ambulatory Visit: Payer: Self-pay

## 2021-12-14 ENCOUNTER — Encounter: Payer: Self-pay | Admitting: Rheumatology

## 2021-12-14 VITALS — BP 131/81 | HR 64 | Resp 12 | Ht 72.0 in | Wt 183.0 lb

## 2021-12-14 DIAGNOSIS — F419 Anxiety disorder, unspecified: Secondary | ICD-10-CM

## 2021-12-14 DIAGNOSIS — M19041 Primary osteoarthritis, right hand: Secondary | ICD-10-CM | POA: Diagnosis not present

## 2021-12-14 DIAGNOSIS — M0600 Rheumatoid arthritis without rheumatoid factor, unspecified site: Secondary | ICD-10-CM | POA: Diagnosis not present

## 2021-12-14 DIAGNOSIS — M5030 Other cervical disc degeneration, unspecified cervical region: Secondary | ICD-10-CM

## 2021-12-14 DIAGNOSIS — M19071 Primary osteoarthritis, right ankle and foot: Secondary | ICD-10-CM

## 2021-12-14 DIAGNOSIS — Z8659 Personal history of other mental and behavioral disorders: Secondary | ICD-10-CM

## 2021-12-14 DIAGNOSIS — F32A Depression, unspecified: Secondary | ICD-10-CM

## 2021-12-14 DIAGNOSIS — M19072 Primary osteoarthritis, left ankle and foot: Secondary | ICD-10-CM

## 2021-12-14 DIAGNOSIS — R748 Abnormal levels of other serum enzymes: Secondary | ICD-10-CM | POA: Diagnosis not present

## 2021-12-14 DIAGNOSIS — Z8679 Personal history of other diseases of the circulatory system: Secondary | ICD-10-CM

## 2021-12-14 DIAGNOSIS — M19042 Primary osteoarthritis, left hand: Secondary | ICD-10-CM

## 2021-12-14 DIAGNOSIS — Z79899 Other long term (current) drug therapy: Secondary | ICD-10-CM

## 2021-12-14 DIAGNOSIS — M5136 Other intervertebral disc degeneration, lumbar region: Secondary | ICD-10-CM

## 2021-12-14 DIAGNOSIS — M51369 Other intervertebral disc degeneration, lumbar region without mention of lumbar back pain or lower extremity pain: Secondary | ICD-10-CM

## 2021-12-14 DIAGNOSIS — Z87438 Personal history of other diseases of male genital organs: Secondary | ICD-10-CM

## 2021-12-14 DIAGNOSIS — R252 Cramp and spasm: Secondary | ICD-10-CM

## 2021-12-14 DIAGNOSIS — Z8639 Personal history of other endocrine, nutritional and metabolic disease: Secondary | ICD-10-CM

## 2021-12-14 DIAGNOSIS — R5383 Other fatigue: Secondary | ICD-10-CM

## 2021-12-14 NOTE — Patient Instructions (Addendum)
Standing Labs We placed an order today for your standing lab work.   Please have your standing labs drawn in April and every 3 months  If possible, please have your labs drawn 2 weeks prior to your appointment so that the provider can discuss your results at your appointment.  Please note that you may see your imaging and lab results in [facility] before we have reviewed them. We may be awaiting multiple results to interpret others before contacting you. Please allow our office up to 72 hours to thoroughly review all of the results before contacting the office for clarification of your results.  We have open lab daily: Monday through Thursday from 1:30-4:30 PM and Friday from 1:30-4:00 PM at the office of Dr. Bo Merino, [facility].   Please be advised, all patients with office appointments requiring lab work will take precedent over walk-in lab work.  If possible, please come for your lab work on Monday and Friday afternoons, as you may experience shorter wait times. The office is located at [address] [place], [place], [place] [address] No appointment is necessary.   Labs are drawn by [facility]. Please bring your co-pay at the time of your lab draw.  You may receive a bill from [facility] for your lab work.  If you wish to have your labs drawn at another location, please call the office 24 hours in advance to send orders.  If you have any questions regarding directions or hours of operation,  please call [phone].   As a reminder, please drink plenty of water prior to coming for your lab work. Thanks!   Vaccines You are taking a medication(s) that can suppress your immune system.  The following immunizations are recommended: Flu annually Covid-19  Td/Tdap (tetanus, diphtheria, pertussis) every 10 years Pneumonia (Prevnar 15 then Pneumovax 23 at least 1 year apart.  Alternatively, can take Prevnar 20 without needing additional dose) Shingrix: 2 doses from 4 weeks  to 6 months apart  Please check with your PCP to make sure you are up to date.   If you have signs or symptoms of an infection or start antibiotics: First, call your PCP for workup of your infection. Hold your medication through the infection, until you complete your antibiotics, and until symptoms resolve if you take the following: Injectable medication (Actemra, Benlysta, Cimzia, Cosentyx, Enbrel, Humira, Kevzara, Orencia, Remicade, Simponi, Stelara, Taltz, Tremfya) Methotrexate Leflunomide (Arava) Mycophenolate (Cellcept) Morrie Sheldon, Olumiant, or Rinvoq  Please get annual skin examination to screen for skin cancer while you are on Enbrel. Back Exercises The following exercises strengthen the muscles that help to support the trunk (torso) and back. They also help to keep the lower back flexible. Doing these exercises can help to prevent or lessen existing low back pain. If you have back pain or discomfort, try doing these exercises 2-3 times each day or as told by your health care provider. As your pain improves, do them once each day, but increase the number of times that you repeat the steps for each exercise (do more repetitions). To prevent the recurrence of back pain, continue to do these exercises once each day or as told by your health care provider. Do exercises exactly as told by your health care provider and adjust them as directed. It is normal to feel mild stretching, pulling, tightness, or discomfort as you do these exercises, but you should stop right away if you feel sudden pain or your pain gets worse. Exercises Single knee to  chest Repeat these steps 3-5 times for each leg: Lie on your back on a firm bed or the floor with your legs extended. Bring one knee to your chest. Your other leg should stay extended and in contact with the floor. Hold your knee in place by grabbing your knee or thigh with both hands and hold. Pull on your knee until you feel a gentle stretch in your  lower back or buttocks. Hold the stretch for 10-30 seconds. Slowly release and straighten your leg.  Pelvic tilt Repeat these steps 5-10 times: Lie on your back on a firm bed or the floor with your legs extended. Bend your knees so they are pointing toward the ceiling and your feet are flat on the floor. Tighten your lower abdominal muscles to press your lower back against the floor. This motion will tilt your pelvis so your tailbone points up toward the ceiling instead of pointing to your feet or the floor. With gentle tension and even breathing, hold this position for 5-10 seconds.  Cat-cow Repeat these steps until your lower back becomes more flexible: Get into a hands-and-knees position on a firm bed or the floor. Keep your hands under your shoulders, and keep your knees under your hips. You may place padding under your knees for comfort. Let your head hang down toward your chest. Contract your abdominal muscles and point your tailbone toward the floor so your lower back becomes rounded like the back of a cat. Hold this position for 5 seconds. Slowly lift your head, let your abdominal muscles relax, and point your tailbone up toward the ceiling so your back forms a sagging arch like the back of a cow. Hold this position for 5 seconds.  Press-ups Repeat these steps 5-10 times: Lie on your abdomen (face-down) on a firm bed or the floor. Place your palms near your head, about shoulder-width apart. Keeping your back as relaxed as possible and keeping your hips on the floor, slowly straighten your arms to raise the top half of your body and lift your shoulders. Do not use your back muscles to raise your upper torso. You may adjust the placement of your hands to make yourself more comfortable. Hold this position for 5 seconds while you keep your back relaxed. Slowly return to lying flat on the floor.  Bridges Repeat these steps 10 times: Lie on your back on a firm bed or the floor. Bend  your knees so they are pointing toward the ceiling and your feet are flat on the floor. Your arms should be flat at your sides, next to your body. Tighten your buttocks muscles and lift your buttocks off the floor until your waist is at almost the same height as your knees. You should feel the muscles working in your buttocks and the back of your thighs. If you do not feel these muscles, slide your feet 1-2 inches (2.5-5 cm) farther away from your buttocks. Hold this position for 3-5 seconds. Slowly lower your hips to the starting position, and allow your buttocks muscles to relax completely. If this exercise is too easy, try doing it with your arms crossed over your chest. Abdominal crunches Repeat these steps 5-10 times: Lie on your back on a firm bed or the floor with your legs extended. Bend your knees so they are pointing toward the ceiling and your feet are flat on the floor. Cross your arms over your chest. Tip your chin slightly toward your chest without bending your neck. Tighten your  abdominal muscles and slowly raise your torso high enough to lift your shoulder blades a tiny bit off the floor. Avoid raising your torso higher than that because it can put too much stress on your lower back and does not help to strengthen your abdominal muscles. Slowly return to your starting position.  Back lifts Repeat these steps 5-10 times: Lie on your abdomen (face-down) with your arms at your sides, and rest your forehead on the floor. Tighten the muscles in your legs and your buttocks. Slowly lift your chest off the floor while you keep your hips pressed to the floor. Keep the back of your head in line with the curve in your back. Your eyes should be looking at the floor. Hold this position for 3-5 seconds. Slowly return to your starting position.  Contact a health care provider if: Your back pain or discomfort gets much worse when you do an exercise. Your worsening back pain or discomfort does  not lessen within 2 hours after you exercise. If you have any of these problems, stop doing these exercises right away. Do not do them again unless your health care provider says that you can. Get help right away if: You develop sudden, severe back pain. If this happens, stop doing the exercises right away. Do not do them again unless your health care provider says that you can. This information is not intended to replace advice given to you by your health care provider. Make sure you discuss any questions you have with your health care provider. Document Revised: 05/16/2021 Document Reviewed: 02/01/2021 Elsevier Patient Education  [employer].

## 2021-12-15 LAB — CBC WITH DIFFERENTIAL/PLATELET
Basophils Absolute: 0.1 10*3/uL (ref 0.0–0.2)
Basos: 1 %
EOS (ABSOLUTE): 0.2 10*3/uL (ref 0.0–0.4)
Eos: 4 %
Hematocrit: 40.5 % (ref 37.5–51.0)
Hemoglobin: 14 g/dL (ref 13.0–17.7)
Immature Grans (Abs): 0 10*3/uL (ref 0.0–0.1)
Immature Granulocytes: 0 %
Lymphocytes Absolute: 1.5 10*3/uL (ref 0.7–3.1)
Lymphs: 25 %
MCH: 32.1 pg (ref 26.6–33.0)
MCHC: 34.6 g/dL (ref 31.5–35.7)
MCV: 93 fL (ref 79–97)
Monocytes Absolute: 0.8 10*3/uL (ref 0.1–0.9)
Monocytes: 14 %
Neutrophils Absolute: 3.5 10*3/uL (ref 1.4–7.0)
Neutrophils: 56 %
Platelets: 316 10*3/uL (ref 150–450)
RBC: 4.36 x10E6/uL (ref 4.14–5.80)
RDW: 12.5 % (ref 11.6–15.4)
WBC: 6.1 10*3/uL (ref 3.4–10.8)

## 2021-12-15 LAB — CMP14+EGFR
ALT: 40 IU/L (ref 0–44)
AST: 34 IU/L (ref 0–40)
Albumin/Globulin Ratio: 1.7 (ref 1.2–2.2)
Albumin: 4.6 g/dL (ref 3.8–4.8)
Alkaline Phosphatase: 86 IU/L (ref 44–121)
BUN/Creatinine Ratio: 22 (ref 10–24)
BUN: 21 mg/dL (ref 8–27)
Bilirubin Total: 0.8 mg/dL (ref 0.0–1.2)
CO2: 26 mmol/L (ref 20–29)
Calcium: 9.7 mg/dL (ref 8.6–10.2)
Chloride: 100 mmol/L (ref 96–106)
Creatinine, Ser: 0.97 mg/dL (ref 0.76–1.27)
Globulin, Total: 2.7 g/dL (ref 1.5–4.5)
Glucose: 112 mg/dL — ABNORMAL HIGH (ref 70–99)
Potassium: 4.8 mmol/L (ref 3.5–5.2)
Sodium: 143 mmol/L (ref 134–144)
Total Protein: 7.3 g/dL (ref 6.0–8.5)
eGFR: 88 mL/min/{1.73_m2} (ref >59)

## 2021-12-15 LAB — CK: Total CK: 245 U/L (ref 41–331)

## 2021-12-15 NOTE — Progress Notes (Signed)
Glucose is 112. Rest of CMP WNL.  CBC WNL.  CK WNL.

## 2021-12-25 DIAGNOSIS — R5383 Other fatigue: Secondary | ICD-10-CM | POA: Diagnosis not present

## 2021-12-25 DIAGNOSIS — E039 Hypothyroidism, unspecified: Secondary | ICD-10-CM | POA: Diagnosis not present

## 2021-12-25 DIAGNOSIS — R519 Headache, unspecified: Secondary | ICD-10-CM | POA: Diagnosis not present

## 2021-12-25 DIAGNOSIS — R7303 Prediabetes: Secondary | ICD-10-CM | POA: Diagnosis not present

## 2021-12-25 DIAGNOSIS — U071 COVID-19: Secondary | ICD-10-CM | POA: Diagnosis not present

## 2021-12-29 ENCOUNTER — Other Ambulatory Visit (HOSPITAL_COMMUNITY): Payer: Self-pay

## 2022-01-02 ENCOUNTER — Encounter: Payer: Self-pay | Admitting: Rheumatology

## 2022-01-02 ENCOUNTER — Other Ambulatory Visit: Payer: Self-pay | Admitting: Specialist

## 2022-01-02 ENCOUNTER — Other Ambulatory Visit: Payer: Self-pay | Admitting: Physician Assistant

## 2022-01-02 NOTE — Telephone Encounter (Signed)
Next Visit: 05/15/2022  Last Visit: 12/14/2021  Last Fill: 09/18/2021  DX: Seronegative rheumatoid arthritis   Current Dose per office note 12/14/2021: Methotrexate 6 tablets by mouth once weekly  Labs: 12/14/2021 Glucose is 112. Rest of CMP WNL.  CBC WNL.  CK WNL.   Okay to refill MTX?

## 2022-01-08 ENCOUNTER — Other Ambulatory Visit (HOSPITAL_COMMUNITY): Payer: Self-pay

## 2022-01-12 ENCOUNTER — Other Ambulatory Visit (HOSPITAL_COMMUNITY): Payer: Self-pay

## 2022-01-12 DIAGNOSIS — Z630 Problems in relationship with spouse or partner: Secondary | ICD-10-CM | POA: Diagnosis not present

## 2022-01-12 DIAGNOSIS — F900 Attention-deficit hyperactivity disorder, predominantly inattentive type: Secondary | ICD-10-CM | POA: Diagnosis not present

## 2022-01-16 DIAGNOSIS — Z79899 Other long term (current) drug therapy: Secondary | ICD-10-CM | POA: Diagnosis not present

## 2022-01-16 DIAGNOSIS — F902 Attention-deficit hyperactivity disorder, combined type: Secondary | ICD-10-CM | POA: Diagnosis not present
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
## 2022-01-23 ENCOUNTER — Other Ambulatory Visit: Payer: Self-pay

## 2022-01-23 ENCOUNTER — Ambulatory Visit: Payer: BC Managed Care – PPO | Admitting: Cardiovascular Disease

## 2022-01-23 ENCOUNTER — Encounter: Payer: Self-pay | Admitting: Cardiovascular Disease

## 2022-01-23 VITALS — BP 112/78 | HR 56 | Ht 72.0 in | Wt 179.0 lb

## 2022-01-23 DIAGNOSIS — I471 Supraventricular tachycardia, unspecified: Secondary | ICD-10-CM

## 2022-01-23 DIAGNOSIS — Z8249 Family history of ischemic heart disease and other diseases of the circulatory system: Secondary | ICD-10-CM

## 2022-01-23 DIAGNOSIS — M0600 Rheumatoid arthritis without rheumatoid factor, unspecified site: Secondary | ICD-10-CM

## 2022-01-23 DIAGNOSIS — E785 Hyperlipidemia, unspecified: Secondary | ICD-10-CM | POA: Diagnosis not present

## 2022-01-23 NOTE — Patient Instructions (Signed)
Medication Instructions:  °Your physician recommends that you continue on your current medications as directed. Please refer to the Current Medication list given to you today. ° °*If you need a refill on your cardiac medications before your next appointment, please call your pharmacy* ° °Lab Work: °If you have labs (blood work) drawn today and your tests are completely normal, you will receive your results only by: °MyChart Message (if you have MyChart) OR °A paper copy in the mail °If you have any lab test that is abnormal or we need to change your treatment, we will call you to review the results. ° °Testing/Procedures: °None ordered today. ° °Follow-Up: °At [facility], you and your health needs are our priority.  As part of our continuing mission to provide you with exceptional heart care, we have created designated Provider Care Teams.  These Care Teams include your primary Cardiologist (physician) and Advanced Practice Providers (APPs -  Physician Assistants and Nurse Practitioners) who all work together to provide you with the care you need, when you need it. ° °We recommend signing up for the patient portal called "MyChart".  Sign up information is provided on this After Visit Summary.  MyChart is used to connect with patients for Virtual Visits (Telemedicine).  Patients are able to view lab/test results, encounter notes, upcoming appointments, etc.  Non-urgent messages can be sent to your provider as well.   °To learn more about what you can do with MyChart, go to [email or web address].   ° °Your next appointment:   °12 month(s) ° °The format for your next appointment:   °In Person ° °Provider:   °Peter Nishan, MD { ° °

## 2022-01-29 ENCOUNTER — Other Ambulatory Visit (HOSPITAL_COMMUNITY): Payer: Self-pay

## 2022-01-31 ENCOUNTER — Other Ambulatory Visit: Payer: Self-pay | Admitting: Physician Assistant

## 2022-01-31 ENCOUNTER — Other Ambulatory Visit (HOSPITAL_COMMUNITY): Payer: Self-pay

## 2022-01-31 DIAGNOSIS — F900 Attention-deficit hyperactivity disorder, predominantly inattentive type: Secondary | ICD-10-CM | POA: Diagnosis not present

## 2022-01-31 DIAGNOSIS — Z630 Problems in relationship with spouse or partner: Secondary | ICD-10-CM | POA: Diagnosis not present

## 2022-01-31 MED ORDER — ENBREL MINI 50 MG/ML ~~LOC~~ SOCT
50.0000 mg | SUBCUTANEOUS | 0 refills | Status: DC
  Filled 2022-02-14: qty 4, 28d supply, fill #0
  Filled 2022-03-16: qty 4, 28d supply, fill #1
  Filled 2022-04-18: qty 4, 28d supply, fill #2

## 2022-01-31 NOTE — Telephone Encounter (Signed)
Next Visit: 05/15/2022 ?  ?Last Visit: 12/14/2021 ?  ?Last Fill: 10/25/2021 ? ?DX: Seronegative rheumatoid arthritis  ?  ?Current Dose per office note 12/14/2021: Enbrel 50 mg sq injections once weekly ? ?Labs: 12/14/2021 Glucose is 112. Rest of CMP WNL.  CBC WNL.  CK WNL.  ?  ?Okay to refill Enbrel?  ?

## 2022-02-01 ENCOUNTER — Other Ambulatory Visit (HOSPITAL_COMMUNITY): Payer: Self-pay
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
## 2022-02-07 DIAGNOSIS — F900 Attention-deficit hyperactivity disorder, predominantly inattentive type: Secondary | ICD-10-CM | POA: Diagnosis not present

## 2022-02-07 DIAGNOSIS — Z630 Problems in relationship with spouse or partner: Secondary | ICD-10-CM | POA: Diagnosis not present

## 2022-02-13 ENCOUNTER — Ambulatory Visit: Payer: BC Managed Care – PPO | Admitting: Student

## 2022-02-13 DIAGNOSIS — I471 Supraventricular tachycardia: Secondary | ICD-10-CM

## 2022-02-14 ENCOUNTER — Other Ambulatory Visit (HOSPITAL_COMMUNITY): Payer: Self-pay

## 2022-02-14 DIAGNOSIS — F900 Attention-deficit hyperactivity disorder, predominantly inattentive type: Secondary | ICD-10-CM | POA: Diagnosis not present

## 2022-02-14 DIAGNOSIS — Z630 Problems in relationship with spouse or partner: Secondary | ICD-10-CM | POA: Diagnosis not present

## 2022-02-22 ENCOUNTER — Other Ambulatory Visit (HOSPITAL_COMMUNITY): Payer: Self-pay

## 2022-02-22 DIAGNOSIS — Z630 Problems in relationship with spouse or partner: Secondary | ICD-10-CM | POA: Diagnosis not present

## 2022-02-22 DIAGNOSIS — F900 Attention-deficit hyperactivity disorder, predominantly inattentive type: Secondary | ICD-10-CM | POA: Diagnosis not present

## 2022-03-03 ENCOUNTER — Other Ambulatory Visit: Payer: Self-pay | Admitting: Cardiovascular Disease

## 2022-03-06 ENCOUNTER — Other Ambulatory Visit (HOSPITAL_COMMUNITY): Payer: Self-pay

## 2022-03-07 ENCOUNTER — Other Ambulatory Visit: Payer: Self-pay | Admitting: *Deleted

## 2022-03-07 ENCOUNTER — Telehealth: Payer: Self-pay | Admitting: Rheumatology

## 2022-03-07 DIAGNOSIS — F900 Attention-deficit hyperactivity disorder, predominantly inattentive type: Secondary | ICD-10-CM | POA: Diagnosis not present

## 2022-03-07 DIAGNOSIS — R748 Abnormal levels of other serum enzymes: Secondary | ICD-10-CM

## 2022-03-07 DIAGNOSIS — Z630 Problems in relationship with spouse or partner: Secondary | ICD-10-CM | POA: Diagnosis not present

## 2022-03-07 DIAGNOSIS — Z79899 Other long term (current) drug therapy: Secondary | ICD-10-CM

## 2022-03-07 NOTE — Telephone Encounter (Signed)
Order's released

## 2022-03-07 NOTE — Telephone Encounter (Signed)
Patients wife called the office requesting lab orders be sent to [place]. She states he is going tomorrow.  ?
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
## 2022-03-12 DIAGNOSIS — R748 Abnormal levels of other serum enzymes: Secondary | ICD-10-CM | POA: Diagnosis not present

## 2022-03-12 DIAGNOSIS — Z79899 Other long term (current) drug therapy: Secondary | ICD-10-CM | POA: Diagnosis not present

## 2022-03-13 LAB — CBC WITH DIFFERENTIAL/PLATELET
Basophils Absolute: 0.1 10*3/uL (ref 0.0–0.2)
Basos: 1 %
EOS (ABSOLUTE): 0.1 10*3/uL (ref 0.0–0.4)
Eos: 2 %
Hematocrit: 40.1 % (ref 37.5–51.0)
Hemoglobin: 13.7 g/dL (ref 13.0–17.7)
Immature Grans (Abs): 0 10*3/uL (ref 0.0–0.1)
Immature Granulocytes: 0 %
Lymphocytes Absolute: 1.6 10*3/uL (ref 0.7–3.1)
Lymphs: 23 %
MCH: 32.2 pg (ref 26.6–33.0)
MCHC: 34.2 g/dL (ref 31.5–35.7)
MCV: 94 fL (ref 79–97)
Monocytes Absolute: 0.7 10*3/uL (ref 0.1–0.9)
Monocytes: 10 %
Neutrophils Absolute: 4.5 10*3/uL (ref 1.4–7.0)
Neutrophils: 64 %
Platelets: 278 10*3/uL (ref 150–450)
RBC: 4.26 x10E6/uL (ref 4.14–5.80)
RDW: 13.6 % (ref 11.6–15.4)
WBC: 7 10*3/uL (ref 3.4–10.8)

## 2022-03-13 LAB — CMP14+EGFR
ALT: 31 IU/L (ref 0–44)
AST: 29 IU/L (ref 0–40)
Albumin/Globulin Ratio: 1.7 (ref 1.2–2.2)
Albumin: 4.5 g/dL (ref 3.8–4.8)
Alkaline Phosphatase: 89 IU/L (ref 44–121)
BUN/Creatinine Ratio: 21 (ref 10–24)
BUN: 22 mg/dL (ref 8–27)
Bilirubin Total: 1 mg/dL (ref 0.0–1.2)
CO2: 20 mmol/L (ref 20–29)
Calcium: 9.7 mg/dL (ref 8.6–10.2)
Chloride: 100 mmol/L (ref 96–106)
Creatinine, Ser: 1.07 mg/dL (ref 0.76–1.27)
Globulin, Total: 2.6 g/dL (ref 1.5–4.5)
Glucose: 97 mg/dL (ref 70–99)
Potassium: 4.7 mmol/L (ref 3.5–5.2)
Sodium: 134 mmol/L (ref 134–144)
Total Protein: 7.1 g/dL (ref 6.0–8.5)
eGFR: 78 mL/min/{1.73_m2} (ref >59)

## 2022-03-13 LAB — CK: Total CK: 148 U/L (ref 41–331)

## 2022-03-13 NOTE — Progress Notes (Signed)
CBC and CMP WNL.  CK WNL.

## 2022-03-15 ENCOUNTER — Encounter: Payer: Self-pay | Admitting: Student

## 2022-03-15 ENCOUNTER — Ambulatory Visit (INDEPENDENT_AMBULATORY_CARE_PROVIDER_SITE_OTHER): Payer: BC Managed Care – PPO | Admitting: Student

## 2022-03-15 VITALS — BP 118/70 | HR 53 | Ht 72.0 in | Wt 179.0 lb

## 2022-03-15 DIAGNOSIS — E785 Hyperlipidemia, unspecified: Secondary | ICD-10-CM | POA: Diagnosis not present

## 2022-03-15 DIAGNOSIS — I471 Supraventricular tachycardia: Secondary | ICD-10-CM

## 2022-03-15 NOTE — Patient Instructions (Signed)
Medication Instructions:  ?Your physician recommends that you continue on your current medications as directed. Please refer to the Current Medication list given to you today. ? ?*If you need a refill on your cardiac medications before your next appointment, please call your pharmacy* ? ? ?Lab Work: ?None ?If you have labs (blood work) drawn today and your tests are completely normal, you will receive your results only by: ?MyChart Message (if you have MyChart) OR ?A paper copy in the mail ?If you have any lab test that is abnormal or we need to change your treatment, we will call you to review the results. ? ? ? ?Follow-Up: ?At [facility], you and your health needs are our priority.  As part of our continuing mission to provide you with exceptional heart care, we have created designated Provider Care Teams.  These Care Teams include your primary Cardiologist (physician) and Advanced Practice Providers (APPs -  Physician Assistants and Nurse Practitioners) who all work together to provide you with the care you need, when you need it. ? ?We recommend signing up for the patient portal called "MyChart".  Sign up information is provided on this After Visit Summary.  MyChart is used to connect with patients for Virtual Visits (Telemedicine).  Patients are able to view lab/test results, encounter notes, upcoming appointments, etc.  Non-urgent messages can be sent to your provider as well.   ?To learn more about what you can do with MyChart, go to [email or web address].   ? ?Your next appointment:   ?1 year(s) ? ?The format for your next appointment:   ?In Person ? ?Provider:   ?Will Camnitz, MD  ? ? ?Important Information About Sugar ? ? ? ? ?  ?

## 2022-03-16 ENCOUNTER — Other Ambulatory Visit (HOSPITAL_COMMUNITY): Payer: Self-pay

## 2022-03-20 ENCOUNTER — Other Ambulatory Visit (HOSPITAL_COMMUNITY): Payer: Self-pay

## 2022-03-21 DIAGNOSIS — F900 Attention-deficit hyperactivity disorder, predominantly inattentive type: Secondary | ICD-10-CM | POA: Diagnosis not present

## 2022-03-21 DIAGNOSIS — Z630 Problems in relationship with spouse or partner: Secondary | ICD-10-CM | POA: Diagnosis not present

## 2022-03-28 DIAGNOSIS — F900 Attention-deficit hyperactivity disorder, predominantly inattentive type: Secondary | ICD-10-CM | POA: Diagnosis not present

## 2022-03-28 DIAGNOSIS — Z630 Problems in relationship with spouse or partner: Secondary | ICD-10-CM | POA: Diagnosis not present

## 2022-04-05 DIAGNOSIS — Z630 Problems in relationship with spouse or partner: Secondary | ICD-10-CM | POA: Diagnosis not present

## 2022-04-05 DIAGNOSIS — F900 Attention-deficit hyperactivity disorder, predominantly inattentive type: Secondary | ICD-10-CM | POA: Diagnosis not present

## 2022-04-13 DIAGNOSIS — E039 Hypothyroidism, unspecified: Secondary | ICD-10-CM | POA: Diagnosis not present

## 2022-04-13 DIAGNOSIS — R7303 Prediabetes: Secondary | ICD-10-CM | POA: Diagnosis not present

## 2022-04-13 DIAGNOSIS — Z713 Dietary counseling and surveillance: Secondary | ICD-10-CM | POA: Diagnosis not present

## 2022-04-13 DIAGNOSIS — E063 Autoimmune thyroiditis: Secondary | ICD-10-CM | POA: Diagnosis not present

## 2022-04-16 ENCOUNTER — Other Ambulatory Visit (HOSPITAL_COMMUNITY): Payer: Self-pay

## 2022-04-17 DIAGNOSIS — F900 Attention-deficit hyperactivity disorder, predominantly inattentive type: Secondary | ICD-10-CM | POA: Diagnosis not present

## 2022-04-17 DIAGNOSIS — F419 Anxiety disorder, unspecified: Secondary | ICD-10-CM | POA: Diagnosis not present

## 2022-04-17 DIAGNOSIS — Z79899 Other long term (current) drug therapy: Secondary | ICD-10-CM | POA: Diagnosis not present

## 2022-04-18 ENCOUNTER — Other Ambulatory Visit (HOSPITAL_COMMUNITY): Payer: Self-pay

## 2022-04-23 ENCOUNTER — Other Ambulatory Visit (HOSPITAL_COMMUNITY): Payer: Self-pay

## 2022-04-24 ENCOUNTER — Other Ambulatory Visit: Payer: Self-pay | Admitting: Physician Assistant

## 2022-04-24 NOTE — Telephone Encounter (Signed)
Next Visit: 05/15/2022   Last Visit: 12/14/2021   Last Fill: 01/18/2021  DX: Seronegative rheumatoid arthritis    Current Dose per office note 12/14/2021: folic acid 2 tablets by mouth daily  Okay to refill Folic Acid?

## 2022-04-25 ENCOUNTER — Other Ambulatory Visit: Payer: Self-pay | Admitting: Physician Assistant

## 2022-04-25 NOTE — Telephone Encounter (Signed)
Next Visit: 05/15/2022   Last Visit: 12/14/2021   Last Fill: 01/02/2022   DX: Seronegative rheumatoid arthritis    Current Dose per office note 12/14/2021: Methotrexate 6 tablets by mouth once weekly  Labs: 03/12/2022 CBC and CMP WNL.  Okay to refill MTX?

## 2022-05-01 DIAGNOSIS — F900 Attention-deficit hyperactivity disorder, predominantly inattentive type: Secondary | ICD-10-CM | POA: Diagnosis not present

## 2022-05-01 DIAGNOSIS — Z630 Problems in relationship with spouse or partner: Secondary | ICD-10-CM | POA: Diagnosis not present
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
## 2022-05-13 ENCOUNTER — Encounter: Payer: Self-pay | Admitting: Pharmacist

## 2022-05-13 DIAGNOSIS — R7303 Prediabetes: Secondary | ICD-10-CM

## 2022-05-14 ENCOUNTER — Telehealth: Payer: Self-pay | Admitting: Pharmacist

## 2022-05-14 DIAGNOSIS — R7303 Prediabetes: Secondary | ICD-10-CM

## 2022-05-14 MED ORDER — GLUCOSE BLOOD VI STRP: ORAL_STRIP | 12 refills | Status: AC

## 2022-05-14 MED ORDER — GLUCOSE BLOOD VI STRP: ORAL_STRIP | 12 refills | Status: DC

## 2022-05-14 NOTE — Telephone Encounter (Signed)
Pt c/o medication issue:  1. Name of Medication: glucose blood test strip  2. How are you currently taking this medication (dosage and times per day)? N/A  3. Are you having a reaction (difficulty breathing--STAT)? N/A  4. What is your medication issue? Pharmacy would like to know the frequency for the test strips and what supporting items will also be needed.

## 2022-05-14 NOTE — Addendum Note (Signed)
Addended by: Rollen Sox on: 05/14/2022 09:35 AM   Modules accepted: Orders

## 2022-05-15 ENCOUNTER — Ambulatory Visit (INDEPENDENT_AMBULATORY_CARE_PROVIDER_SITE_OTHER): Payer: BC Managed Care – PPO | Admitting: Physician Assistant

## 2022-05-15 ENCOUNTER — Encounter: Payer: Self-pay | Admitting: Physician Assistant

## 2022-05-15 ENCOUNTER — Other Ambulatory Visit (HOSPITAL_COMMUNITY): Payer: Self-pay

## 2022-05-15 VITALS — BP 116/67 | HR 53 | Resp 12 | Ht 72.0 in | Wt 180.8 lb

## 2022-05-15 DIAGNOSIS — Z111 Encounter for screening for respiratory tuberculosis: Secondary | ICD-10-CM

## 2022-05-15 DIAGNOSIS — Z8679 Personal history of other diseases of the circulatory system: Secondary | ICD-10-CM

## 2022-05-15 DIAGNOSIS — M19041 Primary osteoarthritis, right hand: Secondary | ICD-10-CM | POA: Diagnosis not present

## 2022-05-15 DIAGNOSIS — Z8639 Personal history of other endocrine, nutritional and metabolic disease: Secondary | ICD-10-CM

## 2022-05-15 DIAGNOSIS — M19072 Primary osteoarthritis, left ankle and foot: Secondary | ICD-10-CM

## 2022-05-15 DIAGNOSIS — F32A Depression, unspecified: Secondary | ICD-10-CM

## 2022-05-15 DIAGNOSIS — R5383 Other fatigue: Secondary | ICD-10-CM

## 2022-05-15 DIAGNOSIS — Z8659 Personal history of other mental and behavioral disorders: Secondary | ICD-10-CM

## 2022-05-15 DIAGNOSIS — M0600 Rheumatoid arthritis without rheumatoid factor, unspecified site: Secondary | ICD-10-CM | POA: Diagnosis not present

## 2022-05-15 DIAGNOSIS — M5136 Other intervertebral disc degeneration, lumbar region: Secondary | ICD-10-CM

## 2022-05-15 DIAGNOSIS — Z79899 Other long term (current) drug therapy: Secondary | ICD-10-CM | POA: Diagnosis not present

## 2022-05-15 DIAGNOSIS — M19042 Primary osteoarthritis, left hand: Secondary | ICD-10-CM

## 2022-05-15 DIAGNOSIS — Z87438 Personal history of other diseases of male genital organs: Secondary | ICD-10-CM

## 2022-05-15 DIAGNOSIS — F419 Anxiety disorder, unspecified: Secondary | ICD-10-CM

## 2022-05-15 DIAGNOSIS — R748 Abnormal levels of other serum enzymes: Secondary | ICD-10-CM

## 2022-05-15 DIAGNOSIS — M5030 Other cervical disc degeneration, unspecified cervical region: Secondary | ICD-10-CM

## 2022-05-15 DIAGNOSIS — R252 Cramp and spasm: Secondary | ICD-10-CM

## 2022-05-15 DIAGNOSIS — M19071 Primary osteoarthritis, right ankle and foot: Secondary | ICD-10-CM | POA: Diagnosis not present

## 2022-05-15 MED ORDER — ENBREL MINI 50 MG/ML ~~LOC~~ SOCT
50.0000 mg | SUBCUTANEOUS | 0 refills | Status: DC
  Filled 2022-05-15: qty 12, 84d supply, fill #0
  Filled 2022-05-17: qty 4, 28d supply, fill #0
  Filled 2022-06-21: qty 4, 28d supply, fill #1

## 2022-05-15 NOTE — Patient Instructions (Signed)
Standing Labs ?We placed an order today for your standing lab work.  ? ?Please have your standing labs drawn in July and every 3 months  ? ?If possible, please have your labs drawn 2 weeks prior to your appointment so that the provider can discuss your results at your appointment. ? ?Please note that you may see your imaging and lab results in MyChart before we have reviewed them. ?We may be awaiting multiple results to interpret others before contacting you. ?Please allow our office up to 72 hours to thoroughly review all of the results before contacting the office for clarification of your results. ? ?We have open lab daily: ?Monday through Thursday from 1:30-4:30 PM and Friday from 1:30-4:00 PM ?at the office of Dr. Shaili Deveshwar, [facility].   ?Please be advised, all patients with office appointments requiring lab work will take precedent over walk-in lab work.  ?If possible, please come for your lab work on Monday and Friday afternoons, as you may experience shorter wait times. ?The office is located at [address], [address], [place], [place] [address] ?No appointment is necessary.   ?Labs are drawn by [facility]. Please bring your co-pay at the time of your lab draw.  You may receive a bill from [facility] for your lab work. ? ?Please note if you are on Hydroxychloroquine and and an order has been placed for a Hydroxychloroquine level, you will need to have it drawn 4 hours or more after your last dose. ? ?If you wish to have your labs drawn at another location, please call the office 24 hours in advance to send orders. ? ?If you have any questions regarding directions or hours of operation,  ?please call [phone].   ?As a reminder, please drink plenty of water prior to coming for your lab work. Thanks! ? ?

## 2022-05-17 ENCOUNTER — Other Ambulatory Visit (HOSPITAL_COMMUNITY): Payer: Self-pay

## 2022-05-17 DIAGNOSIS — Z630 Problems in relationship with spouse or partner: Secondary | ICD-10-CM | POA: Diagnosis not present

## 2022-05-17 DIAGNOSIS — F900 Attention-deficit hyperactivity disorder, predominantly inattentive type: Secondary | ICD-10-CM | POA: Diagnosis not present

## 2022-05-23 DIAGNOSIS — F900 Attention-deficit hyperactivity disorder, predominantly inattentive type: Secondary | ICD-10-CM | POA: Diagnosis not present

## 2022-05-23 DIAGNOSIS — Z630 Problems in relationship with spouse or partner: Secondary | ICD-10-CM | POA: Diagnosis not present

## 2022-05-28 ENCOUNTER — Other Ambulatory Visit (HOSPITAL_COMMUNITY): Payer: Self-pay

## 2022-05-28 DIAGNOSIS — F331 Major depressive disorder, recurrent, moderate: Secondary | ICD-10-CM | POA: Diagnosis not present

## 2022-05-28 DIAGNOSIS — F900 Attention-deficit hyperactivity disorder, predominantly inattentive type: Secondary | ICD-10-CM | POA: Diagnosis not present

## 2022-05-28 DIAGNOSIS — F410 Panic disorder [episodic paroxysmal anxiety] without agoraphobia: Secondary | ICD-10-CM | POA: Diagnosis not present

## 2022-05-30 ENCOUNTER — Telehealth: Payer: Self-pay | Admitting: Rheumatology

## 2022-05-30 ENCOUNTER — Other Ambulatory Visit: Payer: Self-pay | Admitting: *Deleted

## 2022-05-30 DIAGNOSIS — R748 Abnormal levels of other serum enzymes: Secondary | ICD-10-CM

## 2022-05-30 DIAGNOSIS — Z630 Problems in relationship with spouse or partner: Secondary | ICD-10-CM | POA: Diagnosis not present

## 2022-05-30 DIAGNOSIS — Z111 Encounter for screening for respiratory tuberculosis: Secondary | ICD-10-CM

## 2022-05-30 DIAGNOSIS — F900 Attention-deficit hyperactivity disorder, predominantly inattentive type: Secondary | ICD-10-CM | POA: Diagnosis not present

## 2022-05-30 DIAGNOSIS — Z79899 Other long term (current) drug therapy: Secondary | ICD-10-CM

## 2022-05-30 NOTE — Telephone Encounter (Signed)
Lab Orders released.  

## 2022-05-30 NOTE — Telephone Encounter (Signed)
Patient's wife called the office requesting labs orders be sent to [facility] on [address]. She states he plans on going tomorrow.

## 2022-05-31 ENCOUNTER — Telehealth: Payer: Self-pay | Admitting: Pharmacist

## 2022-05-31 NOTE — Telephone Encounter (Signed)
Received response from [employer] that authorization is not required for Enbrel. Paid claim on 05/28/22  Chesley Mires, PharmD, MPH, BCPS, CPP Clinical Pharmacist (Rheumatology and Pulmonology)

## 2022-05-31 NOTE — Telephone Encounter (Signed)
Submitted a Prior Authorization RENEWAL request to [facility] for ENBREL via CoverMyMeds. Will update once we receive a response.  Key: [id number]  Chesley Mires, PharmD, MPH, BCPS, CPP Clinical Pharmacist (Rheumatology and Pulmonology)

## 2022-06-01 DIAGNOSIS — R748 Abnormal levels of other serum enzymes: Secondary | ICD-10-CM | POA: Diagnosis not present

## 2022-06-01 DIAGNOSIS — Z79899 Other long term (current) drug therapy: Secondary | ICD-10-CM | POA: Diagnosis not present

## 2022-06-01 DIAGNOSIS — Z111 Encounter for screening for respiratory tuberculosis: Secondary | ICD-10-CM | POA: Diagnosis not present

## 2022-06-05 NOTE — Progress Notes (Signed)
CBC and CMP WNL.  CK WNL.

## 2022-06-06 LAB — CMP14+EGFR
ALT: 28 IU/L (ref 0–44)
AST: 32 IU/L (ref 0–40)
Albumin/Globulin Ratio: 1.8 (ref 1.2–2.2)
Albumin: 4.6 g/dL (ref 3.8–4.8)
Alkaline Phosphatase: 82 IU/L (ref 44–121)
BUN/Creatinine Ratio: 20 (ref 10–24)
BUN: 19 mg/dL (ref 8–27)
Bilirubin Total: 1.2 mg/dL (ref 0.0–1.2)
CO2: 24 mmol/L (ref 20–29)
Calcium: 9.6 mg/dL (ref 8.6–10.2)
Chloride: 100 mmol/L (ref 96–106)
Creatinine, Ser: 0.95 mg/dL (ref 0.76–1.27)
Globulin, Total: 2.6 g/dL (ref 1.5–4.5)
Glucose: 101 mg/dL — ABNORMAL HIGH (ref 70–99)
Potassium: 4.3 mmol/L (ref 3.5–5.2)
Sodium: 141 mmol/L (ref 134–144)
Total Protein: 7.2 g/dL (ref 6.0–8.5)
eGFR: 90 mL/min/{1.73_m2} (ref >59)

## 2022-06-06 LAB — CK: Total CK: 298 U/L (ref 41–331)

## 2022-06-06 LAB — QUANTIFERON-TB GOLD PLUS
QuantiFERON Mitogen Value: >10 IU/mL
QuantiFERON Nil Value: 0.07 IU/mL
QuantiFERON TB1 Ag Value: 0.07 IU/mL
QuantiFERON TB2 Ag Value: 0.08 IU/mL
QuantiFERON-TB Gold Plus: NEGATIVE

## 2022-06-06 LAB — CBC WITH DIFFERENTIAL/PLATELET
Basophils Absolute: 0.1 10*3/uL (ref 0.0–0.2)
Basos: 1 %
EOS (ABSOLUTE): 0.2 10*3/uL (ref 0.0–0.4)
Eos: 5 %
Hematocrit: 39.2 % (ref 37.5–51.0)
Hemoglobin: 13.6 g/dL (ref 13.0–17.7)
Immature Grans (Abs): 0 10*3/uL (ref 0.0–0.1)
Immature Granulocytes: 0 %
Lymphocytes Absolute: 1.5 10*3/uL (ref 0.7–3.1)
Lymphs: 31 %
MCH: 32.5 pg (ref 26.6–33.0)
MCHC: 34.7 g/dL (ref 31.5–35.7)
MCV: 94 fL (ref 79–97)
Monocytes Absolute: 0.6 10*3/uL (ref 0.1–0.9)
Monocytes: 13 %
Neutrophils Absolute: 2.4 10*3/uL (ref 1.4–7.0)
Neutrophils: 50 %
Platelets: 251 10*3/uL (ref 150–450)
RBC: 4.18 x10E6/uL (ref 4.14–5.80)
RDW: 13.3 % (ref 11.6–15.4)
WBC: 4.8 10*3/uL (ref 3.4–10.8)

## 2022-06-06 NOTE — Progress Notes (Signed)
TB gold negative

## 2022-06-19 ENCOUNTER — Other Ambulatory Visit (HOSPITAL_COMMUNITY): Payer: Self-pay

## 2022-06-21 ENCOUNTER — Other Ambulatory Visit (HOSPITAL_COMMUNITY): Payer: Self-pay

## 2022-06-22 ENCOUNTER — Other Ambulatory Visit (HOSPITAL_COMMUNITY): Payer: Self-pay

## 2022-06-22 ENCOUNTER — Telehealth: Payer: Self-pay | Admitting: Rheumatology

## 2022-06-22 NOTE — Telephone Encounter (Signed)
Heather from [facility] left a voicemail stating patient reported low battery in his Enbrel mini.  We need a new prescription. Phone #[phone] Case #[id number]

## 2022-06-22 NOTE — Telephone Encounter (Signed)
Spoke with Sam at [facility] and gave authorization for a new Enbrel Auto touch to be dispensed.

## 2022-06-27 DIAGNOSIS — F900 Attention-deficit hyperactivity disorder, predominantly inattentive type: Secondary | ICD-10-CM | POA: Diagnosis not present

## 2022-06-27 DIAGNOSIS — Z630 Problems in relationship with spouse or partner: Secondary | ICD-10-CM | POA: Diagnosis not present

## 2022-07-02 DIAGNOSIS — Z79899 Other long term (current) drug therapy: Secondary | ICD-10-CM | POA: Diagnosis not present

## 2022-07-02 DIAGNOSIS — F900 Attention-deficit hyperactivity disorder, predominantly inattentive type: Secondary | ICD-10-CM | POA: Diagnosis not present

## 2022-07-02 DIAGNOSIS — F419 Anxiety disorder, unspecified: Secondary | ICD-10-CM | POA: Diagnosis not present

## 2022-07-04 ENCOUNTER — Other Ambulatory Visit: Payer: Self-pay | Admitting: Pharmacist

## 2022-07-04 ENCOUNTER — Other Ambulatory Visit (HOSPITAL_COMMUNITY): Payer: Self-pay

## 2022-07-04 DIAGNOSIS — M0600 Rheumatoid arthritis without rheumatoid factor, unspecified site: Secondary | ICD-10-CM

## 2022-07-04 MED ORDER — ENBREL MINI 50 MG/ML ~~LOC~~ SOCT
50.0000 mg | SUBCUTANEOUS | 0 refills | Status: DC
  Filled 2022-07-04 – 2022-07-18 (×2): qty 4, 28d supply, fill #0

## 2022-07-04 NOTE — Telephone Encounter (Signed)
Rx for Enbrel Mini 50mg  SQ every 7 days sent to [facility] for hospital-based cost pricing.   Total qty remaining is 4 ml  [facility], PharmD, MPH, BCPS, CPP Clinical Pharmacist (Rheumatology and Pulmonology)

## 2022-07-11 DIAGNOSIS — F900 Attention-deficit hyperactivity disorder, predominantly inattentive type: Secondary | ICD-10-CM | POA: Diagnosis not present

## 2022-07-11 DIAGNOSIS — Z630 Problems in relationship with spouse or partner: Secondary | ICD-10-CM | POA: Diagnosis not present

## 2022-07-16 ENCOUNTER — Other Ambulatory Visit (HOSPITAL_COMMUNITY): Payer: Self-pay

## 2022-07-18 ENCOUNTER — Other Ambulatory Visit (HOSPITAL_COMMUNITY): Payer: Self-pay

## 2022-07-18 DIAGNOSIS — Z630 Problems in relationship with spouse or partner: Secondary | ICD-10-CM | POA: Diagnosis not present

## 2022-07-18 DIAGNOSIS — F900 Attention-deficit hyperactivity disorder, predominantly inattentive type: Secondary | ICD-10-CM | POA: Diagnosis not present

## 2022-07-23 ENCOUNTER — Other Ambulatory Visit (HOSPITAL_COMMUNITY): Payer: Self-pay

## 2022-07-25 DIAGNOSIS — Z630 Problems in relationship with spouse or partner: Secondary | ICD-10-CM | POA: Diagnosis not present

## 2022-07-25 DIAGNOSIS — F900 Attention-deficit hyperactivity disorder, predominantly inattentive type: Secondary | ICD-10-CM | POA: Diagnosis not present

## 2022-08-01 DIAGNOSIS — Z630 Problems in relationship with spouse or partner: Secondary | ICD-10-CM | POA: Diagnosis not present

## 2022-08-01 DIAGNOSIS — F900 Attention-deficit hyperactivity disorder, predominantly inattentive type: Secondary | ICD-10-CM | POA: Diagnosis not present

## 2022-08-08 ENCOUNTER — Other Ambulatory Visit: Payer: Self-pay | Admitting: Physician Assistant

## 2022-08-08 NOTE — Telephone Encounter (Signed)
Next Visit: 10/18/2022  Last Visit: 05/15/2022  Last Fill: 04/25/2022  DX: Seronegative rheumatoid arthritis   Current Dose per office note 05/15/2022: Methotrexate 6 tablets by mouth once weekly  Labs: 06/01/2022 CBC and CMP WNL. CK WNL.   Okay to refill MTX?

## 2022-08-14 ENCOUNTER — Other Ambulatory Visit (HOSPITAL_COMMUNITY): Payer: Self-pay

## 2022-08-15 ENCOUNTER — Other Ambulatory Visit (HOSPITAL_COMMUNITY): Payer: Self-pay

## 2022-08-15 ENCOUNTER — Other Ambulatory Visit: Payer: Self-pay | Admitting: Rheumatology

## 2022-08-15 DIAGNOSIS — M0600 Rheumatoid arthritis without rheumatoid factor, unspecified site: Secondary | ICD-10-CM

## 2022-08-15 DIAGNOSIS — Z630 Problems in relationship with spouse or partner: Secondary | ICD-10-CM | POA: Diagnosis not present

## 2022-08-15 DIAGNOSIS — F900 Attention-deficit hyperactivity disorder, predominantly inattentive type: Secondary | ICD-10-CM | POA: Diagnosis not present

## 2022-08-15 MED ORDER — ENBREL MINI 50 MG/ML ~~LOC~~ SOCT
50.0000 mg | SUBCUTANEOUS | 2 refills | Status: DC
  Filled 2022-08-15: qty 4, 28d supply, fill #0
  Filled 2022-09-14: qty 4, 28d supply, fill #1
  Filled 2022-10-15: qty 4, 28d supply, fill #2

## 2022-08-15 NOTE — Telephone Encounter (Signed)
Next Visit: 10/18/2022  Last Visit: 05/15/2022  Last Fill: 07/04/2022 (30 day supply)  OK:[id number] rheumatoid arthritis   Current Dose per office note 05/15/2022: Enbrel 50 mg sq injections once weekly  Labs: 06/01/2022 CBC and CMP WNL. CK WNL.   TB Gold: 06/01/2022 neg   Okay to refill Enbrel?

## 2022-08-20 ENCOUNTER — Other Ambulatory Visit (HOSPITAL_COMMUNITY): Payer: Self-pay

## 2022-08-21 ENCOUNTER — Telehealth: Payer: Self-pay | Admitting: Rheumatology

## 2022-08-21 DIAGNOSIS — Z79899 Other long term (current) drug therapy: Secondary | ICD-10-CM

## 2022-08-21 DIAGNOSIS — R748 Abnormal levels of other serum enzymes: Secondary | ICD-10-CM

## 2022-08-21 NOTE — Telephone Encounter (Signed)
Lab Order released.  

## 2022-08-21 NOTE — Telephone Encounter (Signed)
Patient called requesting labwork orders be sent to [facility] on [address].  Patient plans to go tomorrow, 08/22/22.

## 2022-08-23 DIAGNOSIS — Z79899 Other long term (current) drug therapy: Secondary | ICD-10-CM | POA: Diagnosis not present

## 2022-08-23 DIAGNOSIS — R748 Abnormal levels of other serum enzymes: Secondary | ICD-10-CM | POA: Diagnosis not present

## 2022-08-24 LAB — CMP14+EGFR
ALT: 29 IU/L (ref 0–44)
AST: 25 IU/L (ref 0–40)
Albumin/Globulin Ratio: 1.9 (ref 1.2–2.2)
Albumin: 4.3 g/dL (ref 3.9–4.9)
Alkaline Phosphatase: 78 IU/L (ref 44–121)
BUN/Creatinine Ratio: 18 (ref 10–24)
BUN: 21 mg/dL (ref 8–27)
Bilirubin Total: 0.7 mg/dL (ref 0.0–1.2)
CO2: 22 mmol/L (ref 20–29)
Calcium: 9.4 mg/dL (ref 8.6–10.2)
Chloride: 103 mmol/L (ref 96–106)
Creatinine, Ser: 1.15 mg/dL (ref 0.76–1.27)
Globulin, Total: 2.3 g/dL (ref 1.5–4.5)
Glucose: 109 mg/dL — ABNORMAL HIGH (ref 70–99)
Potassium: 5.2 mmol/L (ref 3.5–5.2)
Sodium: 140 mmol/L (ref 134–144)
Total Protein: 6.6 g/dL (ref 6.0–8.5)
eGFR: 71 mL/min/{1.73_m2} (ref >59)

## 2022-08-24 LAB — CBC WITH DIFFERENTIAL/PLATELET
Basophils Absolute: 0.1 10*3/uL (ref 0.0–0.2)
Basos: 1 %
EOS (ABSOLUTE): 0.2 10*3/uL (ref 0.0–0.4)
Eos: 4 %
Hematocrit: 37.8 % (ref 37.5–51.0)
Hemoglobin: 13.2 g/dL (ref 13.0–17.7)
Immature Grans (Abs): 0 10*3/uL (ref 0.0–0.1)
Immature Granulocytes: 0 %
Lymphocytes Absolute: 1.4 10*3/uL (ref 0.7–3.1)
Lymphs: 25 %
MCH: 33.4 pg — ABNORMAL HIGH (ref 26.6–33.0)
MCHC: 34.9 g/dL (ref 31.5–35.7)
MCV: 96 fL (ref 79–97)
Monocytes Absolute: 0.7 10*3/uL (ref 0.1–0.9)
Monocytes: 13 %
Neutrophils Absolute: 3.1 10*3/uL (ref 1.4–7.0)
Neutrophils: 57 %
Platelets: 238 10*3/uL (ref 150–450)
RBC: 3.95 x10E6/uL — ABNORMAL LOW (ref 4.14–5.80)
RDW: 13.1 % (ref 11.6–15.4)
WBC: 5.5 10*3/uL (ref 3.4–10.8)

## 2022-08-24 LAB — CK: Total CK: 179 U/L (ref 41–331)

## 2022-08-24 NOTE — Progress Notes (Signed)
CBC, CMP, CK are all within normal limits.

## 2022-08-29 DIAGNOSIS — F900 Attention-deficit hyperactivity disorder, predominantly inattentive type: Secondary | ICD-10-CM | POA: Diagnosis not present

## 2022-08-29 DIAGNOSIS — Z630 Problems in relationship with spouse or partner: Secondary | ICD-10-CM | POA: Diagnosis not present

## 2022-08-31 ENCOUNTER — Ambulatory Visit: Payer: BC Managed Care – PPO | Admitting: Specialist

## 2022-09-04 ENCOUNTER — Ambulatory Visit: Payer: Self-pay

## 2022-09-04 ENCOUNTER — Ambulatory Visit (INDEPENDENT_AMBULATORY_CARE_PROVIDER_SITE_OTHER): Payer: BC Managed Care – PPO | Admitting: Specialist

## 2022-09-04 ENCOUNTER — Encounter: Payer: Self-pay | Admitting: Specialist

## 2022-09-04 VITALS — BP 113/70 | Ht 72.0 in

## 2022-09-04 DIAGNOSIS — M545 Low back pain, unspecified: Secondary | ICD-10-CM

## 2022-09-04 DIAGNOSIS — M47816 Spondylosis without myelopathy or radiculopathy, lumbar region: Secondary | ICD-10-CM

## 2022-09-04 DIAGNOSIS — G8929 Other chronic pain: Secondary | ICD-10-CM

## 2022-09-04 DIAGNOSIS — M47812 Spondylosis without myelopathy or radiculopathy, cervical region: Secondary | ICD-10-CM | POA: Diagnosis not present

## 2022-09-04 DIAGNOSIS — M542 Cervicalgia: Secondary | ICD-10-CM

## 2022-09-04 DIAGNOSIS — I739 Peripheral vascular disease, unspecified: Secondary | ICD-10-CM

## 2022-09-04 DIAGNOSIS — M5136 Other intervertebral disc degeneration, lumbar region: Secondary | ICD-10-CM

## 2022-09-04 NOTE — Patient Instructions (Addendum)
Plan: Avoid bending, stooping and avoid lifting weights greater than 10 lbs. Avoid prolong standing and walking. Avoid frequent bending and stooping  No lifting greater than 10 lbs. May use ice or moist heat for pain. Weight loss is of benefit. Handicap license is approved. Dr. Romona Curls secretary/Assistant will call to arrange for epidural steroid injection   Pool exercises are helpful Stationary bicycle, lumbar flexion exercises Referral to [facility] for PT for lumbar spinal
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
## 2022-09-11 ENCOUNTER — Encounter: Payer: Self-pay | Admitting: Specialist

## 2022-09-14 ENCOUNTER — Other Ambulatory Visit (HOSPITAL_COMMUNITY): Payer: Self-pay

## 2022-09-17 ENCOUNTER — Other Ambulatory Visit (HOSPITAL_COMMUNITY): Payer: Self-pay

## 2022-09-18 ENCOUNTER — Telehealth: Payer: Self-pay | Admitting: Physical Medicine and Rehabilitation

## 2022-09-18 NOTE — Telephone Encounter (Signed)
Patient wife has questions about his next two appointments  [phone]

## 2022-09-18 NOTE — Telephone Encounter (Signed)
I called patient's wife. She states that Dr. Louanne Skye told them patient would get RFA and that is what they would like to have. I explained that they have to have the facet blocks first before we can proceed with RFA. Per Jinny Blossom and Dr. Ernestina Patches, patient will have facet blocks at the first appointment. If he gets relief, they will do them again (reason for second appointment).  Advised that Dr. Ernestina Patches will discuss if RFA is an option after those treatments. Patient's wife expressed understanding.

## 2022-09-21 DIAGNOSIS — M5451 Vertebrogenic low back pain: Secondary | ICD-10-CM | POA: Diagnosis not present

## 2022-09-24 DIAGNOSIS — M5451 Vertebrogenic low back pain: Secondary | ICD-10-CM | POA: Diagnosis not present

## 2022-09-27 ENCOUNTER — Ambulatory Visit: Payer: Self-pay

## 2022-09-27 ENCOUNTER — Ambulatory Visit (INDEPENDENT_AMBULATORY_CARE_PROVIDER_SITE_OTHER): Payer: BC Managed Care – PPO | Admitting: Physical Medicine and Rehabilitation

## 2022-09-27 VITALS — BP 115/69 | HR 69

## 2022-09-27 DIAGNOSIS — M5451 Vertebrogenic low back pain: Secondary | ICD-10-CM | POA: Diagnosis not present

## 2022-09-27 DIAGNOSIS — M47812 Spondylosis without myelopathy or radiculopathy, cervical region: Secondary | ICD-10-CM

## 2022-09-27 MED ORDER — BUPIVACAINE HCL 0.25 % IJ SOLN
2.0000 mL | Freq: Once | INTRAMUSCULAR | Status: AC
  Administered 2022-09-27: 2 mL

## 2022-09-27 NOTE — Progress Notes (Signed)
C5-C6 & C6-C7 facet inj planned.  Taking 81mg  aspirin daily. Driver present today with patient. Denies any radicular arm pain

## 2022-09-27 NOTE — Patient Instructions (Signed)

## 2022-10-01 DIAGNOSIS — M5451 Vertebrogenic low back pain: Secondary | ICD-10-CM | POA: Diagnosis not present

## 2022-10-02 ENCOUNTER — Encounter: Payer: Self-pay | Admitting: Physical Medicine and Rehabilitation

## 2022-10-02 ENCOUNTER — Other Ambulatory Visit: Payer: Self-pay | Admitting: Physical Medicine and Rehabilitation

## 2022-10-02 DIAGNOSIS — M542 Cervicalgia: Secondary | ICD-10-CM

## 2022-10-02 DIAGNOSIS — M47812 Spondylosis without myelopathy or radiculopathy, cervical region: Secondary | ICD-10-CM

## 2022-10-02 NOTE — Progress Notes (Signed)
Per pain diary patient reports 100% relief pf pain with recent cervical diagnostic medial branch blocks.

## 2022-10-03 ENCOUNTER — Encounter: Payer: Self-pay | Admitting: Orthopedic Surgery

## 2022-10-03 ENCOUNTER — Ambulatory Visit (INDEPENDENT_AMBULATORY_CARE_PROVIDER_SITE_OTHER): Payer: BC Managed Care – PPO | Admitting: Orthopedic Surgery

## 2022-10-03 VITALS — BP 125/78 | HR 49 | Ht 72.0 in | Wt 181.0 lb

## 2022-10-03 DIAGNOSIS — M542 Cervicalgia: Secondary | ICD-10-CM

## 2022-10-03 DIAGNOSIS — M545 Low back pain, unspecified: Secondary | ICD-10-CM | POA: Diagnosis not present

## 2022-10-03 DIAGNOSIS — G8929 Other chronic pain: Secondary | ICD-10-CM

## 2022-10-03 NOTE — Progress Notes (Signed)
Orthopedic Spine Surgery Office Note  Assessment: Patient is a 66 y.o. male with chronic axial cervical and lumbar spine pain   Plan: -Patient has tried multiple conservative treatments over the last couple of years with no significant relief.  He did get an injection with Dr. Ernestina Patches to his cervical spine that did provide him with relief.  He is set up to do cervical RFA with Dr. Ernestina Patches. -I explained that surgery would not be recommended for axial cervical and lumbar spine pain.  At this point since he has tried all other conservative treatments, his options would be pain management or ablation.  He is interested in the ablation treatment as already scheduled with Dr. Ernestina Patches. -If he does not get relief with the ablation, will provide him with a referral to pain management -Explained that if he gets radicular pain, weakness, incontinence, unsteadiness, hand dexterity issues that could change management and he should return to the office to be reevaluated -Patient should return to office on an as needed basis   Patient expressed understanding of the plan and all questions were answered to the patient's satisfaction.   ___________________________________________________________________________   History:  Patient is a 66 y.o. male who presents today for cervical and lumbar spine.  Patient has a multiyear history of lower neck and lumbar spine pain.  There is no trauma or injury that brought on the pain.  It waxes and wanes in terms of intensity.  It does not radiate into his arms or his legs.  It feels worse with prolonged activity.  It does improve with rest.  He has used gabapentin with some relief.  He is currently taking it at night 300 mg.  He   Weakness: Yes, just generally feels weak.  Most of his weakness is felt in his back. Difficulty with fine motor skills (e.g., buttoning shirts, handwriting): Denies Symptoms of imbalance: Denies Paresthesias and numbness: Denies Bowel or bladder  incontinence: Denies Saddle anesthesia: Denies  Treatments tried: Physical therapy, home exercises, activity modification, NSAIDs, Tylenol, gabapentin, steroid injection  Review of systems: Denies fevers and chills, night sweats, unexplained weight loss, history of cancer, pain that wakes them at night  Past medical history: Depression Anxiety Hyperlipidemia Atrial fibrillation Benign prostatic hyperplasia Chronic pain Rheumatoid arthritis  Allergies: NKDA  Past surgical history:  Bunionectomy Elbow surgery Hammertoe surgery Humerus surgery Shoulder arthroscopy with rotator cuff repair Wrist surgery  Social history: Denies use of nicotine product (smoking, vaping, patches, smokeless) Alcohol use: Yes, 2/week Denies recreational drug use  Physical Exam:  General: no acute distress, appears stated age Neurologic: alert, answering questions appropriately, following commands Respiratory: unlabored breathing on room air, symmetric chest rise Psychiatric: appropriate affect, normal cadence to speech   MSK (spine):  -Strength exam      Left  Right Grip strength                5/5  5/5 Interosseus   5/5   5/5 Wrist extension  5/5  5/5 Wrist flexion   5/5  5/5 Elbow flexion   5/5  5/5 Deltoid    5/5  5/5  EHL    5/5  5/5 TA    5/5  5/5 GSC    5/5  5/5 Knee extension  5/5  5/5 Hip flexion   5/5  5/5  -Sensory exam    Sensation intact to light touch in L3-S1 nerve distributions of bilateral lower extremities  Sensation intact to light touch in C5-T1 nerve distributions of bilateral  upper extremities  -Brachioradialis DTR: 1/4 on the left, 1/4 on the right -Biceps DTR: 2/4 on the left, 2/4 on the right -Achilles DTR: 1/4 on the left, 1/4 on the right -Patellar tendon DTR: 1/4 on the left, 1/4 on the right  -Spurling: negative bilaterally -Hoffman sign: Negative bilaterally -Clonus: No beats bilaterally -Interosseous wasting: None seen -Grip and release  test: Negative -Romberg: Negative -Gait: Normal -Imbalance with tandem gait: No  Imaging: XR of the cervical spine from 09/04/2022 was independently reviewed and interpreted, showing disc height loss at multiple levels. It is most significant at C5/6 and C6/7. No evidence of instability on flexion/extension. No fracture or dislocation. Facet arthropathy in the lower cervical levels.   X-ray of the lumbar spine from 09/04/2022 was independently reviewed and interpreted, showing multilevel disc height loss which is most pronounced at the upper lumbar levels.  Facet arthropathy.  No evidence of instability on flexion-extension.  Lordotic alignment.  A 55.  LL of 58.   Patient name: Paul [name] Patient MRN: [id number] Date of visit: 10/03/22

## 2022-10-03 NOTE — Procedures (Signed)
Diagnostic Cervical Facet Joint Nerve Block with Fluoroscopic Guidance  Patient: Paul [name]      Date of Birth: July 07, 1956 MRN: [id number] PCP: Wenda Low, MD      Visit Date: 09/27/2022   Universal Protocol:    Date/Time: 11/01/236:11 AM  Consent Given By: the patient  Position: PRONE  Additional Comments: Vital signs were monitored before and after the procedure. Patient was prepped and draped in the usual sterile fashion. The correct patient, procedure, and site was verified.   Injection Procedure Details:   Procedure diagnoses: Cervical spondylosis without myelopathy [M47.812]   Meds Administered:  Meds ordered this encounter  Medications   bupivacaine (MARCAINE) 0.25 % (with pres) injection 2 mL     Laterality: Bilateral  Location/Site:  C5-6 C6-7  Needle size: 25 G  Needle type: Spinal  Needle Placement: Articular Pillar  Findings:  -Contrast Used: 0.5 mL iohexol 180 mg iodine/mL   -Comments: Excellent flow of contrast across the articular pillars without intravascular flow  Procedure Details: The fluoroscope beam was positioned to square off the endplates of the desired vertebral level to achieve a true AP position. The beam was then moved in a small "counter" oblique to the contralateral side with a small amount of caudal tilt to achieve a trajectory alignment with the desired nerves.  For each target described below the skin was anesthetized with 1 ml of 1% Lidocaine without epinephrine.   To block the facet joint nerves from C3 through C7, the lateral masses of these respective levels were localized under fluoroscopic visualization.  A spinal needle was inserted down to the "waist" at the above mentioned cervical levels.  The  needle was then "walked off" until it rested just lateral to the trough of the lateral mass of the medial branch nerve, which innervates the cervical facet joint.   After contact with periosteum and negative aspirate  for blood and CSF, correct placement without intravascular or epidural spread was confirmed by Bi-planar images and  injecting 0.5 ml. of Omnipaque-240.  A spot radiograph was obtained of this image.  Next, a 0.5 ml. volume of 1% Lidocaine without Epinephrine was then injected.  Prior to the procedure, the patient was given a Pain Diary which was completed for baseline measurements.  After the procedure, the patient rated their pain every 30 minutes and will continue rating at this frequency for a total of 5 hours.  The patient has been asked to complete the Diary and return to [place] by mail, fax or hand delivered as soon as possible.   Additional Comments:  The patient tolerated the procedure well Dressing: Band-Aid    Post-procedure details: Patient was observed during the procedure. Post-procedure instructions were reviewed.  Patient left the clinic in stable condition.

## 2022-10-03 NOTE — Progress Notes (Signed)
Paul [name] - 66 y.o. male MRN [id number]  Date of birth: October 24, 1956  Office Visit Note: Visit Date: 09/27/2022 PCP: Wenda Low, MD Referred by: Jessy Oto, MD  Subjective: Chief Complaint  Patient presents with   Neck - Pain   HPI:  Paul [name] is a 66 y.o. male who comes in today at the request of Dr. Basil Dess for planned Bilateral  C5-6 and C6-7 Cervical facet/medial branch block with fluoroscopic guidance.  The patient has failed conservative care including home exercise, medications, time and activity modification.  This injection will be diagnostic and hopefully therapeutic.  Please see requesting physician notes for further details and justification.  Exam has shown concordant pain with facet joint loading.   ROS Otherwise per HPI.  Assessment & Plan: Visit Diagnoses:    ICD-10-CM   1. Cervical spondylosis without myelopathy  M47.812 XR C-ARM NO REPORT    Facet Injection    bupivacaine (MARCAINE) 0.25 % (with pres) injection 2 mL      Plan: No additional findings.   Meds & Orders:  Meds ordered this encounter  Medications   bupivacaine (MARCAINE) 0.25 % (with pres) injection 2 mL    Orders Placed This Encounter  Procedures   Facet Injection   XR C-ARM NO REPORT    Follow-up: Return for Review Pain Diary.   Procedures: No procedures performed  Diagnostic Cervical Facet Joint Nerve Block with Fluoroscopic Guidance  Patient: Paul [name]      Date of Birth: [birth year] MRN: [id number] PCP: Wenda Low, MD      Visit Date: 09/27/2022   Universal Protocol:    Date/Time: 11/01/236:11 AM  Consent Given By: the patient  Position: PRONE  Additional Comments: Vital signs were monitored before and after the procedure. Patient was prepped and draped in the usual sterile fashion. The correct patient, procedure, and site was verified.   Injection Procedure Details:   Procedure diagnoses: Cervical spondylosis without myelopathy  [M47.812]   Meds Administered:  Meds ordered this encounter  Medications   bupivacaine (MARCAINE) 0.25 % (with pres) injection 2 mL     Laterality: Bilateral  Location/Site:  C5-6 C6-7  Needle size: 25 G  Needle type: Spinal  Needle Placement: Articular Pillar  Findings:  -Contrast Used: 0.5 mL iohexol 180 mg iodine/mL   -Comments: Excellent flow of contrast across the articular pillars without intravascular flow  Procedure Details: The fluoroscope beam was positioned to square off the endplates of the desired vertebral level to achieve a true AP position. The beam was then moved in a small "counter" oblique to the contralateral side with a small amount of caudal tilt to achieve a trajectory alignment with the desired nerves.  For each target described below the skin was anesthetized with 1 ml of 1% Lidocaine without epinephrine.   To block the facet joint nerves from C3 through C7, the lateral masses of these respective levels were localized under fluoroscopic visualization.  A spinal needle was inserted down to the "waist" at the above mentioned cervical levels.  The  needle was then "walked off" until it rested just lateral to the trough of the lateral mass of the medial branch nerve, which innervates the cervical facet joint.   After contact with periosteum and negative aspirate for blood and CSF, correct placement without intravascular or epidural spread was confirmed by Bi-planar images and  injecting 0.5 ml. of Omnipaque-240.  A spot radiograph was obtained of this image.  Next, a  0.5 ml. volume of 1% Lidocaine without Epinephrine was then injected.  Prior to the procedure, the patient was given a Pain Diary which was completed for baseline measurements.  After the procedure, the patient rated their pain every 30 minutes and will continue rating at this frequency for a total of 5 hours.  The patient has been asked to complete the Diary and return to [place] by mail, fax or hand  delivered as soon as possible.   Additional Comments:  The patient tolerated the procedure well Dressing: Band-Aid    Post-procedure details: Patient was observed during the procedure. Post-procedure instructions were reviewed.  Patient left the clinic in stable condition.      Clinical History: No specialty comments available.     Objective:  VS:  HT:    WT:   BMI:     BP:115/69  HR:69bpm  TEMP: ( )  RESP:  Physical Exam Vitals and nursing note reviewed.  Constitutional:      General: He is not in acute distress.    Appearance: Normal appearance. He is not ill-appearing.  HENT:     Head: Normocephalic and atraumatic.     Right Ear: External ear normal.     Left Ear: External ear normal.  Eyes:     Extraocular Movements: Extraocular movements intact.  Cardiovascular:     Rate and Rhythm: Normal rate.     Pulses: Normal pulses.  Abdominal:     General: There is no distension.     Palpations: Abdomen is soft.  Musculoskeletal:        General: No signs of injury.     Cervical back: Neck supple. Tenderness present. No rigidity.     Right lower leg: No edema.     Left lower leg: No edema.     Comments: Patient has good strength in the upper extremities with 5 out of 5 strength in wrist extension long finger flexion APB.  No intrinsic hand muscle atrophy.  Negative Hoffmann's test.  Lymphadenopathy:     Cervical: No cervical adenopathy.  Skin:    Findings: No erythema or rash.  Neurological:     General: No focal deficit present.     Mental Status: He is alert and oriented to person, place, and time.     Sensory: No sensory deficit.     Motor: No weakness or abnormal muscle tone.     Coordination: Coordination normal.  Psychiatric:        Mood and Affect: Mood normal.        Behavior: Behavior normal.      Imaging: No results found.

## 2022-10-04 ENCOUNTER — Ambulatory Visit (INDEPENDENT_AMBULATORY_CARE_PROVIDER_SITE_OTHER): Payer: BC Managed Care – PPO | Admitting: Physical Medicine and Rehabilitation

## 2022-10-04 ENCOUNTER — Ambulatory Visit: Payer: Self-pay

## 2022-10-04 VITALS — BP 126/78 | HR 49

## 2022-10-04 DIAGNOSIS — M5451 Vertebrogenic low back pain: Secondary | ICD-10-CM | POA: Diagnosis not present

## 2022-10-04 DIAGNOSIS — M47816 Spondylosis without myelopathy or radiculopathy, lumbar region: Secondary | ICD-10-CM

## 2022-10-04 DIAGNOSIS — M47812 Spondylosis without myelopathy or radiculopathy, cervical region: Secondary | ICD-10-CM

## 2022-10-04 MED ORDER — BUPIVACAINE HCL 0.25 % IJ SOLN
2.0000 mL | Freq: Once | INTRAMUSCULAR | Status: AC
  Administered 2022-10-04: 2 mL
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
## 2022-10-04 NOTE — Progress Notes (Signed)
Numeric Pain Rating Scale and Functional Assessment Average Pain 2   In the last MONTH (on 0-10 scale) has pain interfered with the following?  1. General activity like being  able to carry out your everyday physical activities such as walking, climbing stairs, carrying groceries, or moving a chair?  Rating(3)   +Driver, -BT, -Dye Allergies.  Rolling neck makes pain worse.

## 2022-10-04 NOTE — Patient Instructions (Signed)

## 2022-10-05 ENCOUNTER — Encounter: Payer: Self-pay | Admitting: Physical Medicine and Rehabilitation

## 2022-10-08 ENCOUNTER — Other Ambulatory Visit (HOSPITAL_COMMUNITY): Payer: Self-pay

## 2022-10-08 ENCOUNTER — Other Ambulatory Visit: Payer: Self-pay | Admitting: Physical Medicine and Rehabilitation

## 2022-10-08 DIAGNOSIS — M47812 Spondylosis without myelopathy or radiculopathy, cervical region: Secondary | ICD-10-CM

## 2022-10-08 MED ORDER — DIAZEPAM 5 MG PO TABS
ORAL_TABLET | ORAL | 0 refills | Status: DC
  Filled 2022-10-08: qty 2, 1d supply, fill #0

## 2022-10-08 NOTE — Progress Notes (Signed)
Paul [name] - 66 y.o. male MRN [id number]  Date of birth: 08-23-1956  Office Visit Note: Visit Date: 10/04/2022 PCP: Wenda Low, MD Referred by: Jessy Oto, MD  Subjective: Chief Complaint  Patient presents with   Neck - Pain   HPI:  Paul [name] is a 66 y.o. male who comes in today for planned repeat Bilateral C5-6 and C6-7 Cervical facet/medial branch block with fluoroscopic guidance.  The patient has failed conservative care including home exercise, medications, time and activity modification.  This injection will be diagnostic and hopefully therapeutic.  Please see requesting physician notes for further details and justification.  Exam shows concordant low back pain with facet joint loading and extension. Patient received more than 80% pain relief from prior injection. This would be the second block in a diagnostic double block paradigm.     Referring:Dr. Basil Dess   ROS Otherwise per HPI.  Assessment & Plan: Visit Diagnoses:    ICD-10-CM   1. Cervical spondylosis without myelopathy  M47.812 XR C-ARM NO REPORT    Facet Injection    bupivacaine (MARCAINE) 0.25 % (with pres) injection 2 mL    2. Spondylosis without myelopathy or radiculopathy, lumbar region  M47.816 MR LUMBAR SPINE WO CONTRAST      Plan: No additional findings.   Meds & Orders:  Meds ordered this encounter  Medications   bupivacaine (MARCAINE) 0.25 % (with pres) injection 2 mL    Orders Placed This Encounter  Procedures   Facet Injection   XR C-ARM NO REPORT   MR LUMBAR SPINE WO CONTRAST    Follow-up: Return for Review Pain Diary.   Procedures: No procedures performed  Diagnostic Cervical Facet Joint Nerve Block with Fluoroscopic Guidance  Patient: Paul [name]      Date of Birth: December 04, 1955 MRN: [id number] PCP: Wenda Low, MD      Visit Date: 10/04/2022   Universal Protocol:    Date/Time: 11/06/238:08 PM  Consent Given By: the patient  Position:  PRONE  Additional Comments: Vital signs were monitored before and after the procedure. Patient was prepped and draped in the usual sterile fashion. The correct patient, procedure, and site was verified.   Injection Procedure Details:   Procedure diagnoses: Cervical spondylosis without myelopathy [M47.812]   Meds Administered:  Meds ordered this encounter  Medications   bupivacaine (MARCAINE) 0.25 % (with pres) injection 2 mL     Laterality: Bilateral  Location/Site:  C5-6 C6-7  Needle size: 25 G  Needle type: Spinal  Needle Placement: Articular Pillar  Findings:  -Contrast Used: 0.5 mL iohexol 180 mg iodine/mL   -Comments: Excellent flow of contrast across the articular pillars without intravascular flow  Procedure Details: The fluoroscope beam was positioned to square off the endplates of the desired vertebral level to achieve a true AP position. The beam was then moved in a small "counter" oblique to the contralateral side with a small amount of caudal tilt to achieve a trajectory alignment with the desired nerves.  For each target described below the skin was anesthetized with 1 ml of 1% Lidocaine without epinephrine.  To block the facet joint nerves from C3 through C7, the lateral masses of these respective levels were localized under fluoroscopic visualization.  A spinal needle was inserted down to the "waist" at the above mentioned cervical levels.  The  needle was then "walked off" until it rested just lateral to the trough of the lateral mass of the medial branch nerve, which  innervates the cervical facet joint.   After contact with periosteum and negative aspirate for blood and CSF, correct placement without intravascular or epidural spread was confirmed by Bi-planar images and  injecting 0.5 ml. of Omnipaque-240.  A spot radiograph was obtained of this image.  Next, a 0.5 ml. volume of 1% Lidocaine without Epinephrine was then injected.  Prior to the procedure,  the patient was given a Pain Diary which was completed for baseline measurements.  After the procedure, the patient rated their pain every 30 minutes and will continue rating at this frequency for a total of 5 hours.  The patient has been asked to complete the Diary and return to [place] by mail, fax or hand delivered as soon as possible.   Additional Comments:  The patient tolerated the procedure well Dressing: Band-Aid    Post-procedure details: Patient was observed during the procedure. Post-procedure instructions were reviewed.  Patient left the clinic in stable condition.      Clinical History: MRI CERVICAL SPINE WITHOUT CONTRAST  TECHNIQUE: Multiplanar, multisequence MR imaging of the cervical spine was performed. No intravenous contrast was administered.  COMPARISON: Cervical spine radiographs 06/22/2020  FINDINGS: Alignment: Mild retrolisthesis at C3-4. 2 mm retrolisthesis C4-5 and 3 mm retrolisthesis C5-6  Vertebrae: Negative for fracture or mass. Erosive change in the dens on the left most likely due to arthropathy.  Cord: Normal signal and morphology. No cord compression.  Posterior Fossa, vertebral arteries, paraspinal tissues: Negative  Disc levels:  C1-2: C1-2 arthropathy with joint space narrowing and effusion. Erosion of the dens. Question rheumatoid arthritis or CPPD.  C2-3: Mild disc and facet degeneration. Negative for stenosis.  C3-4: Moderate left foraminal encroachment due to facet hypertrophy and uncinate spurring. Mild cord flattening ventrally due to spurring without significant spinal stenosis.  C4-5: Disc degeneration with diffuse uncinate spurring and bilateral facet hypertrophy. Mild spinal stenosis and moderate foraminal stenosis bilaterally  C5-6: Disc degeneration with diffuse uncinate spurring. Moderate to severe foraminal stenosis on the left and moderate foraminal stenosis on the right. Bilateral facet degeneration. Cord  flattening with mild spinal stenosis  C6-7: Disc degeneration with diffuse uncinate spurring left greater than right. Moderate to severe left foraminal encroachment and mild right foraminal encroachment.  C7-T1: Mild degenerative change. Negative for stenosis.  IMPRESSION: Cervical spondylosis. Multilevel spondylolisthesis and degenerative change. Multilevel foraminal stenosis as described above  C1-2 arthropathy with joint effusion rosea of the dens. Question rheumatoid arthritis or CPPD.   Electronically Signed By: [name] M.D. On: 07/15/2020 08:3     Objective:  VS:  HT:    WT:   BMI:     BP:126/78  HR:(!) 49bpm  TEMP: ( )  RESP:  Physical Exam Vitals and nursing note reviewed.  Constitutional:      General: He is not in acute distress.    Appearance: Normal appearance. He is not ill-appearing.  HENT:     Head: Normocephalic and atraumatic.     Right Ear: External ear normal.     Left Ear: External ear normal.  Eyes:     Extraocular Movements: Extraocular movements intact.  Cardiovascular:     Rate and Rhythm: Normal rate.     Pulses: Normal pulses.  Abdominal:     General: There is no distension.     Palpations: Abdomen is soft.  Musculoskeletal:        General: No signs of injury.     Cervical back: Neck supple. Tenderness present. No rigidity.  Right lower leg: No edema.     Left lower leg: No edema.     Comments: Patient has good strength in the upper extremities with 5 out of 5 strength in wrist extension long finger flexion APB.  No intrinsic hand muscle atrophy.  Negative Hoffmann's test.  Lymphadenopathy:     Cervical: No cervical adenopathy.  Skin:    Findings: No erythema or rash.  Neurological:     General: No focal deficit present.     Mental Status: He is alert and oriented to person, place, and time.     Sensory: No sensory deficit.     Motor: No weakness or abnormal muscle tone.     Coordination: Coordination normal.   Psychiatric:        Mood and Affect: Mood normal.        Behavior: Behavior normal.      Imaging: No results found.

## 2022-10-08 NOTE — Progress Notes (Signed)
Per pain diary, 100% relief with second set of diagnostic cervical facet blocks. I will place order for radiofrequency ablation.

## 2022-10-08 NOTE — Procedures (Signed)
Diagnostic Cervical Facet Joint Nerve Block with Fluoroscopic Guidance  Patient: Paul [name]      Date of Birth: 07/31/1956 MRN: [id number] PCP: Wenda Low, MD      Visit Date: 10/04/2022   Universal Protocol:    Date/Time: 11/06/238:08 PM  Consent Given By: the patient  Position: PRONE  Additional Comments: Vital signs were monitored before and after the procedure. Patient was prepped and draped in the usual sterile fashion. The correct patient, procedure, and site was verified.   Injection Procedure Details:   Procedure diagnoses: Cervical spondylosis without myelopathy [M47.812]   Meds Administered:  Meds ordered this encounter  Medications   bupivacaine (MARCAINE) 0.25 % (with pres) injection 2 mL     Laterality: Bilateral  Location/Site:  C5-6 C6-7  Needle size: 25 G  Needle type: Spinal  Needle Placement: Articular Pillar  Findings:  -Contrast Used: 0.5 mL iohexol 180 mg iodine/mL   -Comments: Excellent flow of contrast across the articular pillars without intravascular flow  Procedure Details: The fluoroscope beam was positioned to square off the endplates of the desired vertebral level to achieve a true AP position. The beam was then moved in a small "counter" oblique to the contralateral side with a small amount of caudal tilt to achieve a trajectory alignment with the desired nerves.  For each target described below the skin was anesthetized with 1 ml of 1% Lidocaine without epinephrine.  To block the facet joint nerves from C3 through C7, the lateral masses of these respective levels were localized under fluoroscopic visualization.  A spinal needle was inserted down to the "waist" at the above mentioned cervical levels.  The  needle was then "walked off" until it rested just lateral to the trough of the lateral mass of the medial branch nerve, which innervates the cervical facet joint.   After contact with periosteum and negative aspirate for  blood and CSF, correct placement without intravascular or epidural spread was confirmed by Bi-planar images and  injecting 0.5 ml. of Omnipaque-240.  A spot radiograph was obtained of this image.  Next, a 0.5 ml. volume of 1% Lidocaine without Epinephrine was then injected.  Prior to the procedure, the patient was given a Pain Diary which was completed for baseline measurements.  After the procedure, the patient rated their pain every 30 minutes and will continue rating at this frequency for a total of 5 hours.  The patient has been asked to complete the Diary and return to [place] by mail, fax or hand delivered as soon as possible.   Additional Comments:  The patient tolerated the procedure well Dressing: Band-Aid    Post-procedure details: Patient was observed during the procedure. Post-procedure instructions were reviewed.  Patient left the clinic in stable condition.

## 2022-10-09 ENCOUNTER — Other Ambulatory Visit (HOSPITAL_COMMUNITY): Payer: Self-pay

## 2022-10-09 DIAGNOSIS — M5451 Vertebrogenic low back pain: Secondary | ICD-10-CM | POA: Diagnosis not present

## 2022-10-11 ENCOUNTER — Other Ambulatory Visit (HOSPITAL_COMMUNITY): Payer: Self-pay

## 2022-10-11 DIAGNOSIS — M5451 Vertebrogenic low back pain: Secondary | ICD-10-CM | POA: Diagnosis not present

## 2022-10-15 ENCOUNTER — Other Ambulatory Visit (HOSPITAL_COMMUNITY): Payer: Self-pay

## 2022-10-15 DIAGNOSIS — F902 Attention-deficit hyperactivity disorder, combined type: Secondary | ICD-10-CM | POA: Diagnosis not present

## 2022-10-15 DIAGNOSIS — M5451 Vertebrogenic low back pain: Secondary | ICD-10-CM | POA: Diagnosis not present

## 2022-10-15 DIAGNOSIS — Z79899 Other long term (current) drug therapy: Secondary | ICD-10-CM | POA: Diagnosis not present

## 2022-10-17 DIAGNOSIS — M5451 Vertebrogenic low back pain: Secondary | ICD-10-CM | POA: Diagnosis not present

## 2022-10-18 ENCOUNTER — Ambulatory Visit: Payer: BC Managed Care – PPO | Attending: Rheumatology | Admitting: Rheumatology

## 2022-10-18 ENCOUNTER — Encounter: Payer: Self-pay | Admitting: Rheumatology

## 2022-10-18 VITALS — BP 112/68 | HR 56 | Resp 18 | Ht 73.0 in | Wt 174.6 lb

## 2022-10-18 DIAGNOSIS — F32A Depression, unspecified: Secondary | ICD-10-CM

## 2022-10-18 DIAGNOSIS — F419 Anxiety disorder, unspecified: Secondary | ICD-10-CM

## 2022-10-18 DIAGNOSIS — Z87438 Personal history of other diseases of male genital organs: Secondary | ICD-10-CM

## 2022-10-18 DIAGNOSIS — M19071 Primary osteoarthritis, right ankle and foot: Secondary | ICD-10-CM

## 2022-10-18 DIAGNOSIS — Z8679 Personal history of other diseases of the circulatory system: Secondary | ICD-10-CM

## 2022-10-18 DIAGNOSIS — M19041 Primary osteoarthritis, right hand: Secondary | ICD-10-CM | POA: Diagnosis not present

## 2022-10-18 DIAGNOSIS — Z79899 Other long term (current) drug therapy: Secondary | ICD-10-CM

## 2022-10-18 DIAGNOSIS — M5136 Other intervertebral disc degeneration, lumbar region: Secondary | ICD-10-CM

## 2022-10-18 DIAGNOSIS — M5030 Other cervical disc degeneration, unspecified cervical region: Secondary | ICD-10-CM

## 2022-10-18 DIAGNOSIS — M51369 Other intervertebral disc degeneration, lumbar region without mention of lumbar back pain or lower extremity pain: Secondary | ICD-10-CM

## 2022-10-18 DIAGNOSIS — M0600 Rheumatoid arthritis without rheumatoid factor, unspecified site: Secondary | ICD-10-CM

## 2022-10-18 DIAGNOSIS — M19072 Primary osteoarthritis, left ankle and foot: Secondary | ICD-10-CM

## 2022-10-18 DIAGNOSIS — M19042 Primary osteoarthritis, left hand: Secondary | ICD-10-CM

## 2022-10-18 DIAGNOSIS — R748 Abnormal levels of other serum enzymes: Secondary | ICD-10-CM

## 2022-10-18 DIAGNOSIS — Z8659 Personal history of other mental and behavioral disorders: Secondary | ICD-10-CM

## 2022-10-18 DIAGNOSIS — Z8639 Personal history of other endocrine, nutritional and metabolic disease: Secondary | ICD-10-CM

## 2022-10-18 DIAGNOSIS — R5383 Other fatigue: Secondary | ICD-10-CM

## 2022-10-18 DIAGNOSIS — R252 Cramp and spasm: Secondary | ICD-10-CM

## 2022-10-18 NOTE — Patient Instructions (Signed)
Standing Labs We placed an order today for your standing lab work.   Please have your standing labs drawn in December and every 3 months  Please have your labs drawn 2 weeks prior to your appointment so that the provider can discuss your lab results at your appointment.  Please note that you may see your imaging and lab results in MyChart before we have reviewed them. We will contact you once all results are reviewed. Please allow our office up to 72 hours to thoroughly review all of the results before contacting the office for clarification of your results.  Lab hours are:   Monday through Thursday from 8:00 am -12:30 pm and 1:00 pm-5:00 pm and Friday from 8:00 am-12:00 pm.  Please be advised, all patients with office appointments requiring lab work will take precedent over walk-in lab work.   Labs are drawn by [facility]. Please bring your co-pay at the time of your lab draw.  You may receive a bill from [facility] for your lab work.  Please note if you are on Hydroxychloroquine and and an order has been placed for a Hydroxychloroquine level, you will need to have it drawn 4 hours or more after your last dose.  If you wish to have your labs drawn at another location, please call the office 24 hours in advance so we can fax the orders.  The office is located at [address], [address], [place], [place] [address] No appointment is necessary.    If you have any questions regarding directions or hours of operation,  please call [phone].   As a reminder, please drink plenty of water prior to coming for your lab work. Thanks!   Vaccines You are taking a medication(s) that can suppress your immune system.  The following immunizations are recommended: Flu annually Covid-19  Td/Tdap (tetanus, diphtheria, pertussis) every 10 years Pneumonia (Prevnar 15 then Pneumovax 23 at least 1 year apart.  Alternatively, can take Prevnar 20 without needing additional dose) Shingrix: 2 doses from 4 weeks  to 6 months apart  Please check with your PCP to make sure you are up to date.   If you have signs or symptoms of an infection or start antibiotics: First, call your PCP for workup of your infection. Hold your medication through the infection, until you complete your antibiotics, and until symptoms resolve if you take the following: Injectable medication (Actemra, Benlysta, Cimzia, Cosentyx, Enbrel, Humira, Kevzara, Orencia, Remicade, Simponi, Stelara, Taltz, Tremfya) Methotrexate Leflunomide (Arava) Mycophenolate (Cellcept) Osborne [place], or Rinvoq  Please get an annual skin examination to screen for skin cancer by a dermatologist while you are on Enbrel.

## 2022-10-22 DIAGNOSIS — M5451 Vertebrogenic low back pain: Secondary | ICD-10-CM | POA: Diagnosis not present

## 2022-10-23 ENCOUNTER — Ambulatory Visit
Admission: RE | Admit: 2022-10-23 | Discharge: 2022-10-23 | Disposition: A | Discharge status: Home / Self Care | Payer: BC Managed Care – PPO | Source: Ambulatory Visit | Attending: Physical Medicine and Rehabilitation | Admitting: Physical Medicine and Rehabilitation

## 2022-10-23 DIAGNOSIS — M545 Low back pain, unspecified: Secondary | ICD-10-CM | POA: Diagnosis not present

## 2022-10-23 DIAGNOSIS — M48061 Spinal stenosis, lumbar region without neurogenic claudication: Secondary | ICD-10-CM | POA: Diagnosis not present

## 2022-10-23 DIAGNOSIS — F900 Attention-deficit hyperactivity disorder, predominantly inattentive type: Secondary | ICD-10-CM | POA: Diagnosis not present

## 2022-10-23 DIAGNOSIS — Z630 Problems in relationship with spouse or partner: Secondary | ICD-10-CM | POA: Diagnosis not present

## 2022-10-24 DIAGNOSIS — M5451 Vertebrogenic low back pain: Secondary | ICD-10-CM | POA: Diagnosis not present

## 2022-10-30 ENCOUNTER — Ambulatory Visit (INDEPENDENT_AMBULATORY_CARE_PROVIDER_SITE_OTHER): Payer: BC Managed Care – PPO | Admitting: Physical Medicine and Rehabilitation

## 2022-10-30 ENCOUNTER — Encounter: Payer: Self-pay | Admitting: Physical Medicine and Rehabilitation

## 2022-10-30 DIAGNOSIS — M4726 Other spondylosis with radiculopathy, lumbar region: Secondary | ICD-10-CM | POA: Diagnosis not present

## 2022-10-30 DIAGNOSIS — M7918 Myalgia, other site: Secondary | ICD-10-CM

## 2022-10-30 DIAGNOSIS — M5416 Radiculopathy, lumbar region: Secondary | ICD-10-CM

## 2022-10-30 DIAGNOSIS — M47816 Spondylosis without myelopathy or radiculopathy, lumbar region: Secondary | ICD-10-CM

## 2022-10-30 DIAGNOSIS — M0600 Rheumatoid arthritis without rheumatoid factor, unspecified site: Secondary | ICD-10-CM
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
## 2022-10-30 NOTE — Progress Notes (Signed)
Numeric Pain Rating Scale and Functional Assessment Average Pain 3   In the last MONTH (on 0-10 scale) has pain interfered with the following?  1. General activity like being  able to carry out your everyday physical activities such as walking, climbing stairs, carrying groceries, or moving a chair?  Rating(8)  Bending makes pain worse. Pain in neck and lower back

## 2022-11-01 DIAGNOSIS — Z630 Problems in relationship with spouse or partner: Secondary | ICD-10-CM | POA: Diagnosis not present

## 2022-11-01 DIAGNOSIS — F900 Attention-deficit hyperactivity disorder, predominantly inattentive type: Secondary | ICD-10-CM | POA: Diagnosis not present

## 2022-11-01 DIAGNOSIS — M5451 Vertebrogenic low back pain: Secondary | ICD-10-CM | POA: Diagnosis not present

## 2022-11-05 ENCOUNTER — Other Ambulatory Visit (HOSPITAL_COMMUNITY): Payer: Self-pay

## 2022-11-05 DIAGNOSIS — M5451 Vertebrogenic low back pain: Secondary | ICD-10-CM | POA: Diagnosis not present

## 2022-11-06 ENCOUNTER — Telehealth: Payer: Self-pay

## 2022-11-06 NOTE — Telephone Encounter (Signed)
Left voicemail to return call to schedule appointment for back injection

## 2022-11-07 DIAGNOSIS — M5451 Vertebrogenic low back pain: Secondary | ICD-10-CM | POA: Diagnosis not present

## 2022-11-08 ENCOUNTER — Ambulatory Visit: Payer: Self-pay

## 2022-11-08 ENCOUNTER — Other Ambulatory Visit (HOSPITAL_COMMUNITY): Payer: Self-pay

## 2022-11-08 ENCOUNTER — Other Ambulatory Visit: Payer: Self-pay | Admitting: Rheumatology

## 2022-11-08 ENCOUNTER — Ambulatory Visit (INDEPENDENT_AMBULATORY_CARE_PROVIDER_SITE_OTHER): Payer: BC Managed Care – PPO | Admitting: Physical Medicine and Rehabilitation

## 2022-11-08 VITALS — BP 127/73 | HR 50

## 2022-11-08 DIAGNOSIS — Z79899 Other long term (current) drug therapy: Secondary | ICD-10-CM | POA: Diagnosis not present

## 2022-11-08 DIAGNOSIS — M47812 Spondylosis without myelopathy or radiculopathy, cervical region: Secondary | ICD-10-CM | POA: Diagnosis not present

## 2022-11-08 DIAGNOSIS — M0600 Rheumatoid arthritis without rheumatoid factor, unspecified site: Secondary | ICD-10-CM

## 2022-11-08 DIAGNOSIS — F419 Anxiety disorder, unspecified: Secondary | ICD-10-CM | POA: Diagnosis not present

## 2022-11-08 DIAGNOSIS — F900 Attention-deficit hyperactivity disorder, predominantly inattentive type: Secondary | ICD-10-CM | POA: Diagnosis not present

## 2022-11-08 MED ORDER — ENBREL MINI 50 MG/ML ~~LOC~~ SOCT
50.0000 mg | SUBCUTANEOUS | 2 refills | Status: DC
  Filled 2022-11-08 – 2022-11-09 (×2): qty 4, 28d supply, fill #0
  Filled 2022-12-24 – 2022-12-25 (×3): qty 4, 28d supply, fill #1
  Filled 2023-01-15: qty 4, 28d supply, fill #2

## 2022-11-08 MED ORDER — METHYLPREDNISOLONE ACETATE 80 MG/ML IJ SUSP
40.0000 mg | Freq: Once | INTRAMUSCULAR | Status: AC
  Administered 2022-11-08: 40 mg

## 2022-11-08 NOTE — Patient Instructions (Signed)

## 2022-11-08 NOTE — Telephone Encounter (Signed)
Next Visit: 03/19/2023  Last Visit: 10/18/2022  Last Fill: 08/15/2022  [id number] rheumatoid arthritis   Current Dose per office note on 10/18/2022: Enbrel 50 mg sq injections once weekly   Labs: 08/23/2022 CBC, CMP, CK are all within normal limits.   TB Gold: 06/01/2022 negative    Okay to refill enbrel?

## 2022-11-08 NOTE — Progress Notes (Signed)
Numeric Pain Rating Scale and Functional Assessment Average Pain 4   In the last MONTH (on 0-10 scale) has pain interfered with the following?  1. General activity like being  able to carry out your everyday physical activities such as walking, climbing stairs, carrying groceries, or moving a chair?  Rating(6)   +Driver, -BT, -Dye Allergies.   Neck pain

## 2022-11-09 ENCOUNTER — Other Ambulatory Visit: Payer: Self-pay

## 2022-11-09 ENCOUNTER — Other Ambulatory Visit (HOSPITAL_COMMUNITY): Payer: Self-pay

## 2022-11-12 ENCOUNTER — Ambulatory Visit: Payer: Self-pay

## 2022-11-12 ENCOUNTER — Other Ambulatory Visit (HOSPITAL_COMMUNITY): Payer: Self-pay

## 2022-11-12 ENCOUNTER — Ambulatory Visit (INDEPENDENT_AMBULATORY_CARE_PROVIDER_SITE_OTHER): Payer: BC Managed Care – PPO | Admitting: Physical Medicine and Rehabilitation

## 2022-11-12 VITALS — BP 136/77 | HR 45

## 2022-11-12 DIAGNOSIS — M5416 Radiculopathy, lumbar region: Secondary | ICD-10-CM | POA: Diagnosis not present

## 2022-11-12 MED ORDER — METHYLPREDNISOLONE ACETATE 80 MG/ML IJ SUSP
80.0000 mg | Freq: Once | INTRAMUSCULAR | Status: AC
  Administered 2022-11-12: 80 mg

## 2022-11-12 NOTE — Patient Instructions (Signed)

## 2022-11-12 NOTE — Progress Notes (Signed)
Numeric Pain Rating Scale and Functional Assessment Average Pain 3   In the last MONTH (on 0-10 scale) has pain interfered with the following?  1. General activity like being  able to carry out your everyday physical activities such as walking, climbing stairs, carrying groceries, or moving a chair?  Rating(5)   +Driver, -BT, -Dye Allergies.  Lower back pain all the way across

## 2022-11-14 ENCOUNTER — Other Ambulatory Visit: Payer: Self-pay | Admitting: *Deleted

## 2022-11-14 DIAGNOSIS — R748 Abnormal levels of other serum enzymes: Secondary | ICD-10-CM

## 2022-11-14 DIAGNOSIS — Z79899 Other long term (current) drug therapy: Secondary | ICD-10-CM

## 2022-11-14 DIAGNOSIS — M5451 Vertebrogenic low back pain: Secondary | ICD-10-CM | POA: Diagnosis not present

## 2022-11-15 ENCOUNTER — Ambulatory Visit: Payer: Self-pay

## 2022-11-15 ENCOUNTER — Ambulatory Visit (INDEPENDENT_AMBULATORY_CARE_PROVIDER_SITE_OTHER): Payer: BC Managed Care – PPO | Admitting: Physical Medicine and Rehabilitation

## 2022-11-15 VITALS — BP 116/76 | HR 50

## 2022-11-15 DIAGNOSIS — M542 Cervicalgia: Secondary | ICD-10-CM | POA: Diagnosis not present

## 2022-11-15 DIAGNOSIS — M47812 Spondylosis without myelopathy or radiculopathy, cervical region: Secondary | ICD-10-CM | POA: Diagnosis not present

## 2022-11-15 MED ORDER — METHYLPREDNISOLONE ACETATE 80 MG/ML IJ SUSP
40.0000 mg | Freq: Once | INTRAMUSCULAR | Status: AC
  Administered 2022-11-15: 40 mg

## 2022-11-15 NOTE — Progress Notes (Signed)
Paul [name] - 66 y.o. male MRN [id number]  Date of birth: 05-06-1956  Office Visit Note: Visit Date: 11/08/2022 PCP: Georgann Housekeeper, MD Referred by: Georgann Housekeeper, MD  Subjective: Chief Complaint  Patient presents with   Neck - Pain   HPI:  Paul [name] is a 66 y.o. male who comes in today at the request of Dr. Vira Browns and Ellin Goodie, FNP for planned Left  C5-6 and C6-7 Cervical facet/medial branch block with fluoroscopic guidance.  The patient has failed conservative care including home exercise, medications, time and activity modification.  This injection will be diagnostic and hopefully therapeutic.  Please see requesting physician notes for further details and justification.  Exam has shown concordant pain with facet joint loading.   ROS Otherwise per HPI.  Assessment & Plan: Visit Diagnoses:    ICD-10-CM   1. Cervical spondylosis without myelopathy  M47.812 XR C-ARM NO REPORT    Radiofrequency,Cervical    methylPREDNISolone acetate (DEPO-MEDROL) injection 40 mg      Plan: No additional findings.   Meds & Orders:  Meds ordered this encounter  Medications   methylPREDNISolone acetate (DEPO-MEDROL) injection 40 mg    Orders Placed This Encounter  Procedures   Radiofrequency,Cervical   XR C-ARM NO REPORT    Follow-up: Return if symptoms worsen or fail to improve.   Procedures: No procedures performed  Cervical Facet Nerve Denervation  Patient: Paul [name]      Date of Birth: March 27, 1956 MRN: [id number] PCP: Georgann Housekeeper, MD      Visit Date: 11/08/2022   Universal Protocol:    Date/Time: 12/14/238:26 AM  Consent Given By: the patient  Position: PRONE  Additional Comments: Vital signs were monitored before and after the procedure. Patient was prepped and draped in the usual sterile fashion. The correct patient, procedure, and site was verified.   Injection Procedure Details:   Procedure diagnoses:  1. Cervical spondylosis  without myelopathy      Meds Administered:  Meds ordered this encounter  Medications   methylPREDNISolone acetate (DEPO-MEDROL) injection 40 mg     Laterality: Left  Location/Site:  C5-6 C6-7  Needle size: 18 G  Needle type: RF cannula, 5 mm active tip  Findings:  -Comments:   Procedure Details: The fluoroscope beam was positioned to square off the endplates of the desired vertebral level to achieve a true AP position. The beam was then moved in a small "counter" oblique to the contralateral side with a small amount of caudal tilt to achieve a trajectory alignment with the desired nerves.  For each target described below the skin was anesthetized with 1 ml of 1% Lidocaine without epinephrine.  To denervate the facet joint nerve to C2 (third occipital nerve), the cannula was advanced under fluoroscopic guidance and positioned over the inferior lateral portion of the C2/3 facet joint nerve where the third occipital nerve lies.  A minimum of three lesions were made along the location of the nerve.  To denervate the facet joint nerves from C3 through C7, the lateral masses of these respective levels were localized under fluoroscopic visualization.  An outer 20 gauge, 57mm active tip cannula was inserted down the "waist" at the above mentioned cervical levels. The needle was then "walked off" until it rested just lateral to the trough of the lateral mass of the medial branch nerve, which innervates the cervical facet joint.  To denervate the C8 facet joint nerve, the cannula was fluoroscopically introduced onto the Tl transverse  process at its most medial superior end.  For all of these levels, AP and lateral images were used to confirm location.  The radiofrequency probe was inserted into the cannula and stimulation was carried out at both sensory and motor levels to make sure there was expected stimulation without a radicular pattern. Subsequently, this was removed and then 0.5 to 1 ml. of  1% Lidocaine was injected. The radiofrequency probe was re-inserted and denervation of the facet nerves (medial branches of the dorsal rami innervating the facet joints) was carried out at 80 degrees Celsius for 90 seconds. The above procedure was repeated for each facet joint nerve mentioned above. Radiographs were obtained at each level (unless otherwise noted) to verify probe placement during the neurotomy.  Additional Comments:  The patient tolerated the procedure well Dressing: Band-Aid    Post-procedure details: Patient was observed during the procedure. Post-procedure instructions were reviewed. Patient left the clinic in stable condition.       Clinical History: MRI CERVICAL SPINE WITHOUT CONTRAST  TECHNIQUE: Multiplanar, multisequence MR imaging of the cervical spine was performed. No intravenous contrast was administered.  COMPARISON: Cervical spine radiographs 06/22/2020  FINDINGS: Alignment: Mild retrolisthesis at C3-4. 2 mm retrolisthesis C4-5 and 3 mm retrolisthesis C5-6  Vertebrae: Negative for fracture or mass. Erosive change in the dens on the left most likely due to arthropathy.  Cord: Normal signal and morphology. No cord compression.  Posterior Fossa, vertebral arteries, paraspinal tissues: Negative  Disc levels:  C1-2: C1-2 arthropathy with joint space narrowing and effusion. Erosion of the dens. Question rheumatoid arthritis or CPPD.  C2-3: Mild disc and facet degeneration. Negative for stenosis.  C3-4: Moderate left foraminal encroachment due to facet hypertrophy and uncinate spurring. Mild cord flattening ventrally due to spurring without significant spinal stenosis.  C4-5: Disc degeneration with diffuse uncinate spurring and bilateral facet hypertrophy. Mild spinal stenosis and moderate foraminal stenosis bilaterally  C5-6: Disc degeneration with diffuse uncinate spurring. Moderate to severe foraminal stenosis on the left and moderate  foraminal stenosis on the right. Bilateral facet degeneration. Cord flattening with mild spinal stenosis  C6-7: Disc degeneration with diffuse uncinate spurring left greater than right. Moderate to severe left foraminal encroachment and mild right foraminal encroachment.  C7-T1: Mild degenerative change. Negative for stenosis.  IMPRESSION: Cervical spondylosis. Multilevel spondylolisthesis and degenerative change. Multilevel foraminal stenosis as described above  C1-2 arthropathy with joint effusion rosea of the dens. Question rheumatoid arthritis or CPPD.   Electronically Signed By: [name] M.D. On: 07/15/2020 08:3     Objective:  VS:  HT:    WT:   BMI:     BP:127/73  HR:(!) 50bpm  TEMP: ( )  RESP:  Physical Exam Vitals and nursing note reviewed.  Constitutional:      General: He is not in acute distress.    Appearance: Normal appearance. He is not ill-appearing.  HENT:     Head: Normocephalic and atraumatic.     Right Ear: External ear normal.     Left Ear: External ear normal.  Eyes:     Extraocular Movements: Extraocular movements intact.  Cardiovascular:     Rate and Rhythm: Normal rate.     Pulses: Normal pulses.  Abdominal:     General: There is no distension.     Palpations: Abdomen is soft.  Musculoskeletal:        General: No signs of injury.     Cervical back: Neck supple. Tenderness present. No rigidity.  Right lower leg: No edema.     Left lower leg: No edema.     Comments: Patient has good strength in the upper extremities with 5 out of 5 strength in wrist extension long finger flexion APB.  No intrinsic hand muscle atrophy.  Negative Hoffmann's test.  Lymphadenopathy:     Cervical: No cervical adenopathy.  Skin:    Findings: No erythema or rash.  Neurological:     General: No focal deficit present.     Mental Status: He is alert and oriented to person, place, and time.     Sensory: No sensory deficit.     Motor: No weakness or  abnormal muscle tone.     Coordination: Coordination normal.  Psychiatric:        Mood and Affect: Mood normal.        Behavior: Behavior normal.      Imaging: No results found.

## 2022-11-15 NOTE — Progress Notes (Signed)
Numeric Pain Rating Scale and Functional Assessment Average Pain 0   In the last MONTH (on 0-10 scale) has pain interfered with the following?  1. General activity like being  able to carry out your everyday physical activities such as walking, climbing stairs, carrying groceries, or moving a chair?  Rating(0)   +Driver, -BT, -Dye Allergies.  Neck pain

## 2022-11-15 NOTE — Procedures (Signed)
Cervical Facet Nerve Denervation  Patient: Paul [name]      Date of Birth: Apr 04, 1956 MRN: [id number] PCP: Georgann Housekeeper, MD      Visit Date: 11/08/2022   Universal Protocol:    Date/Time: 12/14/238:26 AM  Consent Given By: the patient  Position: PRONE  Additional Comments: Vital signs were monitored before and after the procedure. Patient was prepped and draped in the usual sterile fashion. The correct patient, procedure, and site was verified.   Injection Procedure Details:   Procedure diagnoses:  1. Cervical spondylosis without myelopathy      Meds Administered:  Meds ordered this encounter  Medications   methylPREDNISolone acetate (DEPO-MEDROL) injection 40 mg     Laterality: Left  Location/Site:  C5-6 C6-7  Needle size: 18 G  Needle type: RF cannula, 5 mm active tip  Findings:  -Comments:   Procedure Details: The fluoroscope beam was positioned to square off the endplates of the desired vertebral level to achieve a true AP position. The beam was then moved in a small "counter" oblique to the contralateral side with a small amount of caudal tilt to achieve a trajectory alignment with the desired nerves.  For each target described below the skin was anesthetized with 1 ml of 1% Lidocaine without epinephrine.  To denervate the facet joint nerve to C2 (third occipital nerve), the cannula was advanced under fluoroscopic guidance and positioned over the inferior lateral portion of the C2/3 facet joint nerve where the third occipital nerve lies.  A minimum of three lesions were made along the location of the nerve.  To denervate the facet joint nerves from C3 through C7, the lateral masses of these respective levels were localized under fluoroscopic visualization.  An outer 20 gauge, 99mm active tip cannula was inserted down the "waist" at the above mentioned cervical levels. The needle was then "walked off" until it rested just lateral to the trough of the  lateral mass of the medial branch nerve, which innervates the cervical facet joint.  To denervate the C8 facet joint nerve, the cannula was fluoroscopically introduced onto the Tl transverse process at its most medial superior end.  For all of these levels, AP and lateral images were used to confirm location.  The radiofrequency probe was inserted into the cannula and stimulation was carried out at both sensory and motor levels to make sure there was expected stimulation without a radicular pattern. Subsequently, this was removed and then 0.5 to 1 ml. of 1% Lidocaine was injected. The radiofrequency probe was re-inserted and denervation of the facet nerves (medial branches of the dorsal rami innervating the facet joints) was carried out at 80 degrees Celsius for 90 seconds. The above procedure was repeated for each facet joint nerve mentioned above. Radiographs were obtained at each level (unless otherwise noted) to verify probe placement during the neurotomy.  Additional Comments:  The patient tolerated the procedure well Dressing: Band-Aid    Post-procedure details: Patient was observed during the procedure. Post-procedure instructions were reviewed. Patient left the clinic in stable condition.

## 2022-11-15 NOTE — Patient Instructions (Signed)

## 2022-11-16 DIAGNOSIS — Z79899 Other long term (current) drug therapy: Secondary | ICD-10-CM | POA: Diagnosis not present

## 2022-11-16 DIAGNOSIS — R748 Abnormal levels of other serum enzymes: Secondary | ICD-10-CM | POA: Diagnosis not present

## 2022-11-16 NOTE — Progress Notes (Signed)
Paul [name] - 66 y.o. male MRN [id number]  Date of birth: 08/19/1956  Office Visit Note: Visit Date: 11/15/2022 PCP: Paul Low, MD Referred by: Paul Low, MD  Subjective: Chief Complaint  Patient presents with   Neck - Pain   HPI:  Paul [name] is a 66 y.o. male who comes in todayfor planned radiofrequency ablation of the Right C5-6 and C6-7 Cervical facet joints. This would be ablation of the corresponding medial branches and/or dorsal rami.  Patient has had double diagnostic blocks with more than 50% relief.  These are documented on pain diary.  They have had chronic back pain for quite some time, more than 3 months, which has been an ongoing situation with recalcitrant axial back pain.  They have no radicular pain.  Their axial pain is worse with standing and ambulating and on exam today with facet loading.  They have had physical therapy as well as home exercise program.  The imaging noted in the chart below indicated facet pathology. Accordingly they meet all the criteria and qualification for for radiofrequency ablation and we are going to complete this today hopefully for more longer term relief as part of comprehensive management program.   ROS Otherwise per HPI.  Assessment & Plan: Visit Diagnoses:    ICD-10-CM   1. Cervical spondylosis without myelopathy  M47.812 XR C-ARM NO REPORT    Radiofrequency,Cervical    methylPREDNISolone acetate (DEPO-MEDROL) injection 40 mg    2. Cervicalgia  M54.2 XR C-ARM NO REPORT    Radiofrequency,Cervical    methylPREDNISolone acetate (DEPO-MEDROL) injection 40 mg      Plan: No additional findings.   Meds & Orders:  Meds ordered this encounter  Medications   methylPREDNISolone acetate (DEPO-MEDROL) injection 40 mg    Orders Placed This Encounter  Procedures   Radiofrequency,Cervical   XR C-ARM NO REPORT    Follow-up: Return if symptoms worsen or fail to improve.   Procedures: No procedures performed   Cervical Facet Nerve Denervation  Patient: Paul [name]      Date of Birth: Apr 08, 1956 MRN: [id number] PCP: Paul Low, MD      Visit Date: 11/15/2022   Universal Protocol:    Date/Time: 12/15/238:26 AM  Consent Given By: the patient  Position: PRONE  Additional Comments: Vital signs were monitored before and after the procedure. Patient was prepped and draped in the usual sterile fashion. The correct patient, procedure, and site was verified.   Injection Procedure Details:   Procedure diagnoses:  1. Cervical spondylosis without myelopathy   2. Cervicalgia      Meds Administered:  Meds ordered this encounter  Medications   methylPREDNISolone acetate (DEPO-MEDROL) injection 40 mg     Laterality: Right  Location/Site:  C5-6 C6-7  Needle size: 18 G  Needle type: RF cannula, 5 mm active tip  Findings:  -Comments:   Procedure Details: The fluoroscope beam was positioned to square off the endplates of the desired vertebral level to achieve a true AP position. The beam was then moved in a small "counter" oblique to the contralateral side with a small amount of caudal tilt to achieve a trajectory alignment with the desired nerves.  For each target described below the skin was anesthetized with 1 ml of 1% Lidocaine without epinephrine.   To denervate the facet joint nerves from C3 through C7, the lateral masses of these respective levels were localized under fluoroscopic visualization.  An outer 20 gauge, 76mm active tip cannula was inserted down  the "waist" at the above mentioned cervical levels. The needle was then "walked off" until it rested just lateral to the trough of the lateral mass of the medial branch nerve, which innervates the cervical facet joint.   For all of these levels, AP and lateral images were used to confirm location.  The radiofrequency probe was inserted into the cannula and stimulation was carried out at both sensory and motor levels to  make sure there was expected stimulation without a radicular pattern. Subsequently, this was removed and then 0.5 to 1 ml. of 1% Lidocaine was injected. The radiofrequency probe was re-inserted and denervation of the facet nerves (medial branches of the dorsal rami innervating the facet joints) was carried out at 80 degrees Celsius for 90 seconds. The above procedure was repeated for each facet joint nerve mentioned above. Radiographs were obtained at each level (unless otherwise noted) to verify probe placement during the neurotomy.  Additional Comments:  The patient tolerated the procedure well Dressing: Band-Aid    Post-procedure details: Patient was observed during the procedure. Post-procedure instructions were reviewed. Patient left the clinic in stable condition.      Clinical History: MRI CERVICAL SPINE WITHOUT CONTRAST  TECHNIQUE: Multiplanar, multisequence MR imaging of the cervical spine was performed. No intravenous contrast was administered.  COMPARISON: Cervical spine radiographs 06/22/2020  FINDINGS: Alignment: Mild retrolisthesis at C3-4. 2 mm retrolisthesis C4-5 and 3 mm retrolisthesis C5-6  Vertebrae: Negative for fracture or mass. Erosive change in the dens on the left most likely due to arthropathy.  Cord: Normal signal and morphology. No cord compression.  Posterior Fossa, vertebral arteries, paraspinal tissues: Negative  Disc levels:  C1-2: C1-2 arthropathy with joint space narrowing and effusion. Erosion of the dens. Question rheumatoid arthritis or CPPD.  C2-3: Mild disc and facet degeneration. Negative for stenosis.  C3-4: Moderate left foraminal encroachment due to facet hypertrophy and uncinate spurring. Mild cord flattening ventrally due to spurring without significant spinal stenosis.  C4-5: Disc degeneration with diffuse uncinate spurring and bilateral facet hypertrophy. Mild spinal stenosis and moderate foraminal stenosis  bilaterally  C5-6: Disc degeneration with diffuse uncinate spurring. Moderate to severe foraminal stenosis on the left and moderate foraminal stenosis on the right. Bilateral facet degeneration. Cord flattening with mild spinal stenosis  C6-7: Disc degeneration with diffuse uncinate spurring left greater than right. Moderate to severe left foraminal encroachment and mild right foraminal encroachment.  C7-T1: Mild degenerative change. Negative for stenosis.  IMPRESSION: Cervical spondylosis. Multilevel spondylolisthesis and degenerative change. Multilevel foraminal stenosis as described above  C1-2 arthropathy with joint effusion rosea of the dens. Question rheumatoid arthritis or CPPD.   Electronically Signed By: Franchot Gallo M.D. On: 07/15/2020 08:3     Objective:  VS:  HT:    WT:   BMI:     BP:116/76  HR:(!) 50bpm  TEMP: ( )  RESP:  Physical Exam Vitals and nursing note reviewed.  Constitutional:      General: He is not in acute distress.    Appearance: Normal appearance. He is not ill-appearing.  HENT:     Head: Normocephalic and atraumatic.     Right Ear: External ear normal.     Left Ear: External ear normal.  Eyes:     Extraocular Movements: Extraocular movements intact.  Cardiovascular:     Rate and Rhythm: Normal rate.     Pulses: Normal pulses.  Abdominal:     General: There is no distension.     Palpations: Abdomen is soft.  Musculoskeletal:        General: No signs of injury.     Cervical back: Neck supple. Tenderness present. No rigidity.     Right lower leg: No edema.     Left lower leg: No edema.     Comments: Patient has good strength in the upper extremities with 5 out of 5 strength in wrist extension long finger flexion APB.  No intrinsic hand muscle atrophy.  Negative Hoffmann's test.  Lymphadenopathy:     Cervical: No cervical adenopathy.  Skin:    Findings: No erythema or rash.  Neurological:     General: No focal deficit present.      Mental Status: He is alert and oriented to person, place, and time.     Sensory: No sensory deficit.     Motor: No weakness or abnormal muscle tone.     Coordination: Coordination normal.  Psychiatric:        Mood and Affect: Mood normal.        Behavior: Behavior normal.      Imaging: XR C-ARM NO REPORT  Result Date: 11/15/2022 Please see Notes tab for imaging impression.

## 2022-11-16 NOTE — Procedures (Signed)
Cervical Facet Nerve Denervation  Patient: Paul [name]      Date of Birth: June 21, 1956 MRN: [id number] PCP: Georgann Housekeeper, MD      Visit Date: 11/15/2022   Universal Protocol:    Date/Time: 12/15/238:26 AM  Consent Given By: the patient  Position: PRONE  Additional Comments: Vital signs were monitored before and after the procedure. Patient was prepped and draped in the usual sterile fashion. The correct patient, procedure, and site was verified.   Injection Procedure Details:   Procedure diagnoses:  1. Cervical spondylosis without myelopathy   2. Cervicalgia      Meds Administered:  Meds ordered this encounter  Medications   methylPREDNISolone acetate (DEPO-MEDROL) injection 40 mg     Laterality: Right  Location/Site:  C5-6 C6-7  Needle size: 18 G  Needle type: RF cannula, 5 mm active tip  Findings:  -Comments:   Procedure Details: The fluoroscope beam was positioned to square off the endplates of the desired vertebral level to achieve a true AP position. The beam was then moved in a small "counter" oblique to the contralateral side with a small amount of caudal tilt to achieve a trajectory alignment with the desired nerves.  For each target described below the skin was anesthetized with 1 ml of 1% Lidocaine without epinephrine.   To denervate the facet joint nerves from C3 through C7, the lateral masses of these respective levels were localized under fluoroscopic visualization.  An outer 20 gauge, 36mm active tip cannula was inserted down the "waist" at the above mentioned cervical levels. The needle was then "walked off" until it rested just lateral to the trough of the lateral mass of the medial branch nerve, which innervates the cervical facet joint.   For all of these levels, AP and lateral images were used to confirm location.  The radiofrequency probe was inserted into the cannula and stimulation was carried out at both sensory and motor levels to  make sure there was expected stimulation without a radicular pattern. Subsequently, this was removed and then 0.5 to 1 ml. of 1% Lidocaine was injected. The radiofrequency probe was re-inserted and denervation of the facet nerves (medial branches of the dorsal rami innervating the facet joints) was carried out at 80 degrees Celsius for 90 seconds. The above procedure was repeated for each facet joint nerve mentioned above. Radiographs were obtained at each level (unless otherwise noted) to verify probe placement during the neurotomy.  Additional Comments:  The patient tolerated the procedure well Dressing: Band-Aid    Post-procedure details: Patient was observed during the procedure. Post-procedure instructions were reviewed. Patient left the clinic in stable condition.

## 2022-11-16 NOTE — Procedures (Signed)
Lumbar Epidural Steroid Injection - Interlaminar Approach with Fluoroscopic Guidance  Patient: Paul [name]      Date of Birth: Oct 07, 1956 MRN: [id number] PCP: Georgann Housekeeper, MD      Visit Date: 11/12/2022   Universal Protocol:     Consent Given By: the patient  Position: PRONE  Additional Comments: Vital signs were monitored before and after the procedure. Patient was prepped and draped in the usual sterile fashion. The correct patient, procedure, and site was verified.   Injection Procedure Details:   Procedure diagnoses: Lumbar radiculopathy [M54.16]   Meds Administered:  Meds ordered this encounter  Medications   methylPREDNISolone acetate (DEPO-MEDROL) injection 80 mg     Laterality: Left  Location/Site:  L2-3  Needle: 3.5 in., 20 ga. Tuohy  Needle Placement: Paramedian epidural  Findings:   -Comments: Excellent flow of contrast into the epidural space.  Procedure Details: Using a paramedian approach from the side mentioned above, the region overlying the inferior lamina was localized under fluoroscopic visualization and the soft tissues overlying this structure were infiltrated with 4 ml. of 1% Lidocaine without Epinephrine. The Tuohy needle was inserted into the epidural space using a paramedian approach.   The epidural space was localized using loss of resistance along with counter oblique bi-planar fluoroscopic views.  After negative aspirate for air, blood, and CSF, a 2 ml. volume of Isovue-250 was injected into the epidural space and the flow of contrast was observed. Radiographs were obtained for documentation purposes.    The injectate was administered into the level noted above.   Additional Comments:  The patient tolerated the procedure well Dressing: 2 x 2 sterile gauze and Band-Aid    Post-procedure details: Patient was observed during the procedure. Post-procedure instructions were reviewed.  Patient left the clinic in stable  condition.

## 2022-11-16 NOTE — Progress Notes (Signed)
Paul [name] - 66 y.o. male MRN [id number]  Date of birth: 1956-02-22  Office Visit Note: Visit Date: 11/12/2022 PCP: Georgann Housekeeper, MD Referred by: Georgann Housekeeper, MD  Subjective: Chief Complaint  Patient presents with   Lower Back - Pain   HPI:  Paul [name] is a 66 y.o. male who comes in today at the request of Paul Goodie, FNP for planned Left L2-3 Lumbar Interlaminar epidural steroid injection with fluoroscopic guidance.  The patient has failed conservative care including home exercise, medications, time and activity modification.  This injection will be diagnostic and hopefully therapeutic.  Please see requesting physician notes for further details and justification.   ROS Otherwise per HPI.  Assessment & Plan: Visit Diagnoses:    ICD-10-CM   1. Lumbar radiculopathy  M54.16 XR C-ARM NO REPORT    Epidural Steroid injection    methylPREDNISolone acetate (DEPO-MEDROL) injection 80 mg      Plan: No additional findings.   Meds & Orders:  Meds ordered this encounter  Medications   methylPREDNISolone acetate (DEPO-MEDROL) injection 80 mg    Orders Placed This Encounter  Procedures   XR C-ARM NO REPORT   Epidural Steroid injection    Follow-up: Return if symptoms worsen or fail to improve.   Procedures: No procedures performed  Lumbar Epidural Steroid Injection - Interlaminar Approach with Fluoroscopic Guidance  Patient: Paul [name]      Date of Birth: [birth year] MRN: [id number] PCP: Georgann Housekeeper, MD      Visit Date: 11/12/2022   Universal Protocol:     Consent Given By: the patient  Position: PRONE  Additional Comments: Vital signs were monitored before and after the procedure. Patient was prepped and draped in the usual sterile fashion. The correct patient, procedure, and site was verified.   Injection Procedure Details:   Procedure diagnoses: Lumbar radiculopathy [M54.16]   Meds Administered:  Meds ordered this encounter   Medications   methylPREDNISolone acetate (DEPO-MEDROL) injection 80 mg     Laterality: Left  Location/Site:  L2-3  Needle: 3.5 in., 20 ga. Tuohy  Needle Placement: Paramedian epidural  Findings:   -Comments: Excellent flow of contrast into the epidural space.  Procedure Details: Using a paramedian approach from the side mentioned above, the region overlying the inferior lamina was localized under fluoroscopic visualization and the soft tissues overlying this structure were infiltrated with 4 ml. of 1% Lidocaine without Epinephrine. The Tuohy needle was inserted into the epidural space using a paramedian approach.   The epidural space was localized using loss of resistance along with counter oblique bi-planar fluoroscopic views.  After negative aspirate for air, blood, and CSF, a 2 ml. volume of Isovue-250 was injected into the epidural space and the flow of contrast was observed. Radiographs were obtained for documentation purposes.    The injectate was administered into the level noted above.   Additional Comments:  The patient tolerated the procedure well Dressing: 2 x 2 sterile gauze and Band-Aid    Post-procedure details: Patient was observed during the procedure. Post-procedure instructions were reviewed.  Patient left the clinic in stable condition.   Clinical History: MRI CERVICAL SPINE WITHOUT CONTRAST  TECHNIQUE: Multiplanar, multisequence MR imaging of the cervical spine was performed. No intravenous contrast was administered.  COMPARISON: Cervical spine radiographs 06/22/2020  FINDINGS: Alignment: Mild retrolisthesis at C3-4. 2 mm retrolisthesis C4-5 and 3 mm retrolisthesis C5-6  Vertebrae: Negative for fracture or mass. Erosive change in the dens on the left most likely  due to arthropathy.  Cord: Normal signal and morphology. No cord compression.  Posterior Fossa, vertebral arteries, paraspinal tissues: Negative  Disc levels:  C1-2: C1-2  arthropathy with joint space narrowing and effusion. Erosion of the dens. Question rheumatoid arthritis or CPPD.  C2-3: Mild disc and facet degeneration. Negative for stenosis.  C3-4: Moderate left foraminal encroachment due to facet hypertrophy and uncinate spurring. Mild cord flattening ventrally due to spurring without significant spinal stenosis.  C4-5: Disc degeneration with diffuse uncinate spurring and bilateral facet hypertrophy. Mild spinal stenosis and moderate foraminal stenosis bilaterally  C5-6: Disc degeneration with diffuse uncinate spurring. Moderate to severe foraminal stenosis on the left and moderate foraminal stenosis on the right. Bilateral facet degeneration. Cord flattening with mild spinal stenosis  C6-7: Disc degeneration with diffuse uncinate spurring left greater than right. Moderate to severe left foraminal encroachment and mild right foraminal encroachment.  C7-T1: Mild degenerative change. Negative for stenosis.  IMPRESSION: Cervical spondylosis. Multilevel spondylolisthesis and degenerative change. Multilevel foraminal stenosis as described above  C1-2 arthropathy with joint effusion rosea of the dens. Question rheumatoid arthritis or CPPD.   Electronically Signed By: Paul [name] M.D. On: 07/15/2020 08:3     Objective:  VS:  HT:    WT:   BMI:     BP:136/77  HR:(!) 45bpm  TEMP: ( )  RESP:  Physical Exam Vitals and nursing note reviewed.  Constitutional:      General: He is not in acute distress.    Appearance: Normal appearance. He is not ill-appearing.  HENT:     Head: Normocephalic and atraumatic.     Right Ear: External ear normal.     Left Ear: External ear normal.     Nose: No congestion.  Eyes:     Extraocular Movements: Extraocular movements intact.  Cardiovascular:     Rate and Rhythm: Normal rate.     Pulses: Normal pulses.  Pulmonary:     Effort: Pulmonary effort is normal. No respiratory distress.  Abdominal:      General: There is no distension.     Palpations: Abdomen is soft.  Musculoskeletal:        General: No tenderness or signs of injury.     Cervical back: Neck supple.     Right lower leg: No edema.     Left lower leg: No edema.     Comments: Patient has good distal strength without clonus.  Skin:    Findings: No erythema or rash.  Neurological:     General: No focal deficit present.     Mental Status: He is alert and oriented to person, place, and time.     Sensory: No sensory deficit.     Motor: No weakness or abnormal muscle tone.     Coordination: Coordination normal.  Psychiatric:        Mood and Affect: Mood normal.        Behavior: Behavior normal.      Imaging: XR C-ARM NO REPORT  Result Date: 11/15/2022 Please see Notes tab for imaging impression.

## 2022-11-17 LAB — CBC WITH DIFFERENTIAL/PLATELET
Basophils Absolute: 0 10*3/uL (ref 0.0–0.2)
Basos: 0 %
EOS (ABSOLUTE): 0 10*3/uL (ref 0.0–0.4)
Eos: 0 %
Hematocrit: 36.9 % — ABNORMAL LOW (ref 37.5–51.0)
Hemoglobin: 12.6 g/dL — ABNORMAL LOW (ref 13.0–17.7)
Immature Grans (Abs): 0 10*3/uL (ref 0.0–0.1)
Immature Granulocytes: 0 %
Lymphocytes Absolute: 1.1 10*3/uL (ref 0.7–3.1)
Lymphs: 9 %
MCH: 32 pg (ref 26.6–33.0)
MCHC: 34.1 g/dL (ref 31.5–35.7)
MCV: 94 fL (ref 79–97)
Monocytes Absolute: 1 10*3/uL — ABNORMAL HIGH (ref 0.1–0.9)
Monocytes: 8 %
Neutrophils Absolute: 10.6 10*3/uL — ABNORMAL HIGH (ref 1.4–7.0)
Neutrophils: 83 %
Platelets: 282 10*3/uL (ref 150–450)
RBC: 3.94 x10E6/uL — ABNORMAL LOW (ref 4.14–5.80)
RDW: 12.9 % (ref 11.6–15.4)
WBC: 12.8 10*3/uL — ABNORMAL HIGH (ref 3.4–10.8)

## 2022-11-17 LAB — CMP14+EGFR
ALT: 30 IU/L (ref 0–44)
AST: 23 IU/L (ref 0–40)
Albumin/Globulin Ratio: 1.9 (ref 1.2–2.2)
Albumin: 4.4 g/dL (ref 3.9–4.9)
Alkaline Phosphatase: 85 IU/L (ref 44–121)
BUN/Creatinine Ratio: 23 (ref 10–24)
BUN: 23 mg/dL (ref 8–27)
Bilirubin Total: 0.8 mg/dL (ref 0.0–1.2)
CO2: 22 mmol/L (ref 20–29)
Calcium: 10 mg/dL (ref 8.6–10.2)
Chloride: 100 mmol/L (ref 96–106)
Creatinine, Ser: 0.98 mg/dL (ref 0.76–1.27)
Globulin, Total: 2.3 g/dL (ref 1.5–4.5)
Glucose: 120 mg/dL — ABNORMAL HIGH (ref 70–99)
Potassium: 4.7 mmol/L (ref 3.5–5.2)
Sodium: 139 mmol/L (ref 134–144)
Total Protein: 6.7 g/dL (ref 6.0–8.5)
eGFR: 86 mL/min/{1.73_m2} (ref >59)

## 2022-11-17 LAB — CK: Total CK: 188 U/L (ref 41–331)

## 2022-11-19 NOTE — Progress Notes (Signed)
CK normal, Glucose is mildly elevated probably not a fasting sample. Hemoglobin is low. He should take multivitamin with iron. WBC elevated. Please ask if patient had a recent infection , or if he took oral steroids, or steroid injection.

## 2022-11-20 DIAGNOSIS — M5451 Vertebrogenic low back pain: Secondary | ICD-10-CM | POA: Diagnosis not present

## 2022-11-21 DIAGNOSIS — L812 Freckles: Secondary | ICD-10-CM | POA: Diagnosis not present

## 2022-11-21 DIAGNOSIS — L821 Other seborrheic keratosis: Secondary | ICD-10-CM | POA: Diagnosis not present

## 2022-11-21 DIAGNOSIS — D1801 Hemangioma of skin and subcutaneous tissue: Secondary | ICD-10-CM | POA: Diagnosis not present

## 2022-11-21 DIAGNOSIS — D2262 Melanocytic nevi of left upper limb, including shoulder: Secondary | ICD-10-CM | POA: Diagnosis not present

## 2022-11-21 DIAGNOSIS — D225 Melanocytic nevi of trunk: Secondary | ICD-10-CM | POA: Diagnosis not present

## 2022-11-22 DIAGNOSIS — M5451 Vertebrogenic low back pain: Secondary | ICD-10-CM | POA: Diagnosis not present

## 2022-11-23 ENCOUNTER — Other Ambulatory Visit: Payer: Self-pay | Admitting: Physician Assistant

## 2022-11-23 NOTE — Telephone Encounter (Signed)
Next Visit: 03/19/2023   Last Visit: 10/18/2022   Last Fill: 08/08/2022  OZ:[id number] rheumatoid arthritis    Current Dose per office note on 10/18/2022:  Labs: 11/16/2022 CK normal, Glucose is mildly elevated probably not a fasting sample. Hemoglobin is low. WBC elevated.   Okay to refill MTX?

## 2022-11-27 ENCOUNTER — Other Ambulatory Visit: Payer: Self-pay | Admitting: Physician Assistant

## 2022-11-29 DIAGNOSIS — M5451 Vertebrogenic low back pain: Secondary | ICD-10-CM | POA: Diagnosis not present

## 2022-11-30 ENCOUNTER — Telehealth: Payer: Self-pay | Admitting: Pharmacist

## 2022-11-30 NOTE — Telephone Encounter (Signed)
Received fax from [facility] for patient's Enbrel PA. Submitted a Prior Authorization request to [facility] for ENBREL via CoverMyMeds. Will update once we receive a response.  Key: [id number]  He fills thru [facility] so [place] will need to be updated  Chesley Mires, PharmD, MPH, BCPS, CPP Clinical Pharmacist (Rheumatology and Pulmonology)

## 2022-12-04 ENCOUNTER — Encounter: Payer: Self-pay | Admitting: Physical Medicine and Rehabilitation

## 2022-12-05 ENCOUNTER — Other Ambulatory Visit: Payer: Self-pay | Admitting: Physical Medicine and Rehabilitation

## 2022-12-05 ENCOUNTER — Other Ambulatory Visit: Payer: Self-pay

## 2022-12-05 ENCOUNTER — Other Ambulatory Visit (HOSPITAL_COMMUNITY): Payer: Self-pay

## 2022-12-05 MED ORDER — GABAPENTIN 300 MG PO CAPS
300.0000 mg | ORAL_CAPSULE | Freq: Every day | ORAL | 2 refills | Status: DC
  Filled 2022-12-05: qty 90, 90d supply, fill #0
  Filled 2023-01-18: qty 90, 90d supply, fill #1

## 2022-12-06 DIAGNOSIS — Z0000 Encounter for general adult medical examination without abnormal findings: Secondary | ICD-10-CM | POA: Diagnosis not present

## 2022-12-06 DIAGNOSIS — N400 Enlarged prostate without lower urinary tract symptoms: Secondary | ICD-10-CM | POA: Diagnosis not present

## 2022-12-06 DIAGNOSIS — R7303 Prediabetes: Secondary | ICD-10-CM | POA: Diagnosis not present

## 2022-12-06 DIAGNOSIS — I700 Atherosclerosis of aorta: Secondary | ICD-10-CM | POA: Diagnosis not present

## 2022-12-06 DIAGNOSIS — Z125 Encounter for screening for malignant neoplasm of prostate: Secondary | ICD-10-CM | POA: Diagnosis not present

## 2022-12-06 DIAGNOSIS — Z79899 Other long term (current) drug therapy: Secondary | ICD-10-CM | POA: Diagnosis not present

## 2022-12-06 DIAGNOSIS — E039 Hypothyroidism, unspecified: Secondary | ICD-10-CM | POA: Diagnosis not present

## 2022-12-06 NOTE — Telephone Encounter (Signed)
Received notification from [facility] regarding a prior authorization for ENBREL. Authorization has been APPROVED from 11/30/22 to 11/29/2023. Approval letter sent to scan center.  Patient can continue to fill through [place]: [phone]   Authorization # [id number]  Knox Saliva, PharmD, MPH, BCPS, CPP Clinical Pharmacist (Rheumatology and Pulmonology)

## 2022-12-07 ENCOUNTER — Other Ambulatory Visit (HOSPITAL_COMMUNITY): Payer: Self-pay

## 2022-12-07 ENCOUNTER — Telehealth: Payer: Self-pay | Admitting: Pharmacist

## 2022-12-07 DIAGNOSIS — F900 Attention-deficit hyperactivity disorder, predominantly inattentive type: Secondary | ICD-10-CM | POA: Diagnosis not present

## 2022-12-07 DIAGNOSIS — Z630 Problems in relationship with spouse or partner: Secondary | ICD-10-CM | POA: Diagnosis not present

## 2022-12-07 NOTE — Telephone Encounter (Signed)
Received fax from pharmacy that [name] is needed. This has been submitted, Key: [id number]  Pt had follow up labs checked after starting Repatha at his PCP office on 06/21/21 - LDL dropped from 92 to 46.

## 2022-12-10 MED ORDER — REPATHA SURECLICK 140 MG/ML ~~LOC~~ SOAJ: SUBCUTANEOUS | 11 refills | Status: DC

## 2022-12-10 NOTE — Telephone Encounter (Signed)
Prior auth approved through 12/06/23, refill sent to pharmacy.

## 2022-12-11 ENCOUNTER — Telehealth: Payer: Self-pay | Admitting: *Deleted

## 2022-12-11 DIAGNOSIS — Z79899 Other long term (current) drug therapy: Secondary | ICD-10-CM

## 2022-12-11 NOTE — Telephone Encounter (Signed)
Labs received from:[facility] on:12/06/2022  Reviewed by:Dr.Shaili Deveshwar  Labs drawn:PSA, TSH, Free T4, Hgb A1C, CBC, CMP, Lipid Panel  Results:Hgb A1C 6.0  RBC 3.97  MCV 99.4  MCH 33.3  Creat. 1.48  GFR 52  Chol/HDL 1.7  HDLD 77  Patient is on Enbrel 50 mg SQ once weekly and MTX 6 tabs po weekly and Folic Acid 1 mg.   Per Dr. Estanislado Pandy patient to stop MTX and repeat labs in 2 weeks.    Attempted to contact the patient and left message for patient to call the office.

## 2022-12-12 DIAGNOSIS — F331 Major depressive disorder, recurrent, moderate: Secondary | ICD-10-CM | POA: Diagnosis not present

## 2022-12-12 DIAGNOSIS — F900 Attention-deficit hyperactivity disorder, predominantly inattentive type: Secondary | ICD-10-CM | POA: Diagnosis not present

## 2022-12-12 DIAGNOSIS — F410 Panic disorder [episodic paroxysmal anxiety] without agoraphobia: Secondary | ICD-10-CM | POA: Diagnosis not present

## 2022-12-12 NOTE — Telephone Encounter (Signed)
Patient advised per Dr. Estanislado Pandy patient to stop MTX and repeat labs in 2 weeks.

## 2022-12-12 NOTE — Addendum Note (Signed)
Addended by: Carole Binning on: 12/12/2022 10:32 AM   Modules accepted: Orders

## 2022-12-14 ENCOUNTER — Other Ambulatory Visit (HOSPITAL_COMMUNITY): Payer: Self-pay

## 2022-12-19 DIAGNOSIS — F900 Attention-deficit hyperactivity disorder, predominantly inattentive type: Secondary | ICD-10-CM | POA: Diagnosis not present

## 2022-12-19 DIAGNOSIS — Z630 Problems in relationship with spouse or partner: Secondary | ICD-10-CM | POA: Diagnosis not present

## 2022-12-24 ENCOUNTER — Telehealth: Payer: Self-pay

## 2022-12-24 ENCOUNTER — Other Ambulatory Visit (HOSPITAL_COMMUNITY): Payer: Self-pay

## 2022-12-24 NOTE — Telephone Encounter (Signed)
Received notification from [facility] regarding a prior authorization for ENBREL. Authorization has been APPROVED from 12/24/2022 to 12/23/2023.  No approval letter attached to [facility] request, will send to scan center if we receive via fax.  Authorization # [id number]

## 2022-12-24 NOTE — Telephone Encounter (Signed)
Received notification from [facility] that pt's Enbrel requires a new PA.   Current PA should still be active per 11/30/22 telephone note, however I cannot locate the PA approval letter related to that specific encounter and attempting to access the [facility] key results in this notification (clicking "accept" does NOT close the pop-up window and clicking "cancel" exits back to out to the requests menu) and does not appear to contain any additional information other than the date range of the approval.    The PA request submitted on 12/11/2021 was under a different BCBS member ID than what currently populates in [place], so it is likely that the pt switched plans in 2024.   Submitted an URGENT Prior Authorization request to [facility] for ENBREL via CoverMyMeds. Historically [employer] requires any medication that has not been previously approved under the currently active plan (even if the medication previously approved under a different [employer] plan) be treated as if the pt were just starting the requested medication for the first time--which means that we will possibly receive a "New Start" form from [facility] via fax to be completed and returned. Regardless, will update once we receive a response.  Key: [id number]

## 2022-12-24 NOTE — Telephone Encounter (Signed)
Received notification from [place] at [facility] that pt's insurance was only picking up $31.26 and leaving pt with a copay exceeding $7k. Enbrel's copay card comes in the form of a debit card, which the pt was previously enrolled. Pt's copay in 12/2021 was around the same price as it is now, however the monthly copays decreased exponentially between fills until pt's copay was zero beginning 03/2022.   Since then [facility] has switched over to the [facility] system and it is unclear if the debit card info survived the transition. Attempted to call the [employer] support hotline for assistance, however the billing information will ONLY be provided to the pt directly. Contacted the pt, unfortunately he was unable to locate the copay card information. Provided him with instructions on how to proceed as well as the phone number to [place]. Directed him to reach out to [facility] directly should they provide that info to him. Requested he call the Rheum clinic and ask to speak to the pharmacy team should he run into any issues.  [employer] Support phone# [phone]

## 2022-12-25 ENCOUNTER — Other Ambulatory Visit: Payer: Self-pay

## 2022-12-25 ENCOUNTER — Other Ambulatory Visit (HOSPITAL_COMMUNITY): Payer: Self-pay

## 2022-12-25 NOTE — Telephone Encounter (Addendum)
Arvilla Market talked to patient and he will call Enbrel copay card company for copay assistance information as it is debit card information that cannot be disclosed to anyone but the patient. Patient has been advised that he may have to request a replacement. Arvilla Market was unable to enroll patient since he was previously enrolled. Pt verbalized understanding to above  Per [facility], patient's Enbrel claim has processed for $0.  Knox Saliva, PharmD, MPH, BCPS, CPP Clinical Pharmacist (Rheumatology and Pulmonology)

## 2022-12-26 ENCOUNTER — Other Ambulatory Visit (HOSPITAL_COMMUNITY): Payer: Self-pay

## 2022-12-26 DIAGNOSIS — I10 Essential (primary) hypertension: Secondary | ICD-10-CM | POA: Diagnosis not present

## 2022-12-27 ENCOUNTER — Other Ambulatory Visit (HOSPITAL_COMMUNITY): Payer: Self-pay

## 2022-12-27 ENCOUNTER — Encounter: Payer: Self-pay | Admitting: Rheumatology

## 2022-12-27 DIAGNOSIS — Z630 Problems in relationship with spouse or partner: Secondary | ICD-10-CM | POA: Diagnosis not present

## 2022-12-27 DIAGNOSIS — F900 Attention-deficit hyperactivity disorder, predominantly inattentive type: Secondary | ICD-10-CM | POA: Diagnosis not present

## 2022-12-27 NOTE — Telephone Encounter (Signed)
Previously labs drawn on 12/06/2022:  Labs drawn:PSA, TSH, Free T4, Hgb A1C, CBC, CMP, Lipid Panel  Results:Hgb A1C 6.0  RBC 3.97  MCV 99.4  MCH 33.3  Creat. 1.48  GFR 52  Chol/HDL 1.7  HDLD 77  Patient was advised to stop MTX and repeat labs in 2 weeks.   Patient had BMP drawn on 12/26/2022: BMP WNL (attached to this message) Creatine 1.07 GFR 77

## 2022-12-27 NOTE — Telephone Encounter (Signed)
Attempted to contact [place] and left message to advise her that per Dr. Estanislado Pandy,  If he is not experiencing any swelling he may continue Enbrel and discontinue methotrexate.  Advised her to call the office to discuss.

## 2022-12-27 NOTE — Telephone Encounter (Signed)
Patient's wife Denny Peon returned the call and I advised her of the information below. Patient's wife states the patient has not had any increased joint pain or swelling since being off of MTX. Patient will continue on the enbrel.

## 2022-12-27 NOTE — Telephone Encounter (Signed)
Per [facility], patient's Enbrel is ready to dispense with no copay. Copay card information appears to be entered. Nothing further needed  Knox Saliva, PharmD, MPH, BCPS, CPP Clinical Pharmacist (Rheumatology and Pulmonology)

## 2022-12-27 NOTE — Telephone Encounter (Signed)
If he is not experiencing any swelling he may continue Enbrel and discontinue methotrexate.

## 2023-01-03 DIAGNOSIS — F900 Attention-deficit hyperactivity disorder, predominantly inattentive type: Secondary | ICD-10-CM | POA: Diagnosis not present

## 2023-01-03 DIAGNOSIS — Z630 Problems in relationship with spouse or partner: Secondary | ICD-10-CM | POA: Diagnosis not present

## 2023-01-10 DIAGNOSIS — Z630 Problems in relationship with spouse or partner: Secondary | ICD-10-CM | POA: Diagnosis not present

## 2023-01-10 DIAGNOSIS — F900 Attention-deficit hyperactivity disorder, predominantly inattentive type: Secondary | ICD-10-CM | POA: Diagnosis not present

## 2023-01-15 ENCOUNTER — Other Ambulatory Visit (HOSPITAL_COMMUNITY): Payer: Self-pay

## 2023-01-17 DIAGNOSIS — F900 Attention-deficit hyperactivity disorder, predominantly inattentive type: Secondary | ICD-10-CM | POA: Diagnosis not present

## 2023-01-17 DIAGNOSIS — Z630 Problems in relationship with spouse or partner: Secondary | ICD-10-CM | POA: Diagnosis not present

## 2023-01-18 ENCOUNTER — Other Ambulatory Visit (HOSPITAL_COMMUNITY): Payer: Self-pay

## 2023-01-18 ENCOUNTER — Other Ambulatory Visit: Payer: Self-pay

## 2023-01-19 ENCOUNTER — Other Ambulatory Visit (HOSPITAL_COMMUNITY): Payer: Self-pay

## 2023-01-21 ENCOUNTER — Other Ambulatory Visit: Payer: Self-pay | Admitting: Physical Medicine and Rehabilitation

## 2023-01-21 MED ORDER — GABAPENTIN 300 MG PO CAPS: 300.0000 mg | ORAL_CAPSULE | Freq: Two times a day (BID) | ORAL | 2 refills | Status: DC
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
## 2023-01-23 ENCOUNTER — Encounter: Payer: Self-pay | Admitting: Physician Assistant

## 2023-01-23 ENCOUNTER — Ambulatory Visit: Payer: BC Managed Care – PPO | Attending: Physician Assistant | Admitting: Physician Assistant

## 2023-01-23 VITALS — BP 114/70 | HR 46 | Ht 73.0 in | Wt 176.0 lb

## 2023-01-23 DIAGNOSIS — R413 Other amnesia: Secondary | ICD-10-CM

## 2023-01-23 DIAGNOSIS — E785 Hyperlipidemia, unspecified: Secondary | ICD-10-CM

## 2023-01-23 DIAGNOSIS — M0600 Rheumatoid arthritis without rheumatoid factor, unspecified site: Secondary | ICD-10-CM | POA: Diagnosis not present

## 2023-01-23 DIAGNOSIS — F900 Attention-deficit hyperactivity disorder, predominantly inattentive type: Secondary | ICD-10-CM | POA: Diagnosis not present

## 2023-01-23 DIAGNOSIS — Z8249 Family history of ischemic heart disease and other diseases of the circulatory system: Secondary | ICD-10-CM | POA: Diagnosis not present

## 2023-01-23 DIAGNOSIS — I471 Supraventricular tachycardia, unspecified: Secondary | ICD-10-CM

## 2023-01-23 DIAGNOSIS — F331 Major depressive disorder, recurrent, moderate: Secondary | ICD-10-CM | POA: Diagnosis not present

## 2023-01-23 DIAGNOSIS — F410 Panic disorder [episodic paroxysmal anxiety] without agoraphobia: Secondary | ICD-10-CM | POA: Diagnosis not present

## 2023-01-23 NOTE — Patient Instructions (Addendum)
Medication Instructions:  Hold metoprolol succinate if your heart rate is less than 50 beats per minute. *If you need a refill on your cardiac medications before your next appointment, please call your pharmacy*   Lab Work: None ordered If you have labs (blood work) drawn today and your tests are completely normal, you will receive your results only by: [place] (if you have MyChart) OR A paper copy in the mail If you have any lab test that is abnormal or we need to change your treatment, we will call you to review the results.   Follow-Up: At [facility], you and your health needs are our priority.  As part of our continuing mission to provide you with exceptional heart care, we have created designated Provider Care Teams.  These Care Teams include your primary Cardiologist (physician) and Advanced Practice Providers (APPs -  Physician Assistants and Nurse Practitioners) who all work together to provide you with the care you need, when you need it.  Your next appointment:   1 year(s)  Provider:   Jenkins Rouge, MD    Schedule a 2-3 months or next available with Dr Curt Bears

## 2023-01-24 DIAGNOSIS — F900 Attention-deficit hyperactivity disorder, predominantly inattentive type: Secondary | ICD-10-CM | POA: Diagnosis not present

## 2023-01-24 DIAGNOSIS — Z630 Problems in relationship with spouse or partner: Secondary | ICD-10-CM | POA: Diagnosis not present

## 2023-01-31 DIAGNOSIS — Z630 Problems in relationship with spouse or partner: Secondary | ICD-10-CM | POA: Diagnosis not present

## 2023-01-31 DIAGNOSIS — F900 Attention-deficit hyperactivity disorder, predominantly inattentive type: Secondary | ICD-10-CM | POA: Diagnosis not present

## 2023-02-07 ENCOUNTER — Telehealth: Payer: Self-pay

## 2023-02-07 ENCOUNTER — Other Ambulatory Visit: Payer: Self-pay

## 2023-02-07 DIAGNOSIS — F900 Attention-deficit hyperactivity disorder, predominantly inattentive type: Secondary | ICD-10-CM | POA: Diagnosis not present

## 2023-02-07 DIAGNOSIS — Z79899 Other long term (current) drug therapy: Secondary | ICD-10-CM

## 2023-02-07 DIAGNOSIS — Z630 Problems in relationship with spouse or partner: Secondary | ICD-10-CM | POA: Diagnosis not present

## 2023-02-07 DIAGNOSIS — R748 Abnormal levels of other serum enzymes: Secondary | ICD-10-CM

## 2023-02-07 DIAGNOSIS — F902 Attention-deficit hyperactivity disorder, combined type: Secondary | ICD-10-CM | POA: Diagnosis not present

## 2023-02-07 NOTE — Telephone Encounter (Signed)
Patient's wife, Denny Peon, contacted the office to have his labs sent to [place] were released to [place].

## 2023-02-08 DIAGNOSIS — R748 Abnormal levels of other serum enzymes: Secondary | ICD-10-CM | POA: Diagnosis not present

## 2023-02-08 DIAGNOSIS — Z79899 Other long term (current) drug therapy: Secondary | ICD-10-CM | POA: Diagnosis not present

## 2023-02-09 LAB — CK: Total CK: 173 U/L (ref 41–331)

## 2023-02-09 LAB — CMP14+EGFR
ALT: 26 IU/L (ref 0–44)
AST: 24 IU/L (ref 0–40)
Albumin/Globulin Ratio: 1.9 (ref 1.2–2.2)
Albumin: 4.4 g/dL (ref 3.9–4.9)
Alkaline Phosphatase: 90 IU/L (ref 44–121)
BUN/Creatinine Ratio: 15 (ref 10–24)
BUN: 17 mg/dL (ref 8–27)
Bilirubin Total: 0.8 mg/dL (ref 0.0–1.2)
CO2: 26 mmol/L (ref 20–29)
Calcium: 9.7 mg/dL (ref 8.6–10.2)
Chloride: 100 mmol/L (ref 96–106)
Creatinine, Ser: 1.14 mg/dL (ref 0.76–1.27)
Globulin, Total: 2.3 g/dL (ref 1.5–4.5)
Glucose: 116 mg/dL — ABNORMAL HIGH (ref 70–99)
Potassium: 4.5 mmol/L (ref 3.5–5.2)
Sodium: 140 mmol/L (ref 134–144)
Total Protein: 6.7 g/dL (ref 6.0–8.5)
eGFR: 72 mL/min/{1.73_m2} (ref >59)

## 2023-02-09 LAB — CBC WITH DIFFERENTIAL/PLATELET
Basophils Absolute: 0.1 10*3/uL (ref 0.0–0.2)
Basos: 1 %
EOS (ABSOLUTE): 0.2 10*3/uL (ref 0.0–0.4)
Eos: 3 %
Hematocrit: 41 % (ref 37.5–51.0)
Hemoglobin: 14.3 g/dL (ref 13.0–17.7)
Immature Grans (Abs): 0 10*3/uL (ref 0.0–0.1)
Immature Granulocytes: 0 %
Lymphocytes Absolute: 1.6 10*3/uL (ref 0.7–3.1)
Lymphs: 26 %
MCH: 32.6 pg (ref 26.6–33.0)
MCHC: 34.9 g/dL (ref 31.5–35.7)
MCV: 94 fL (ref 79–97)
Monocytes Absolute: 0.7 10*3/uL (ref 0.1–0.9)
Monocytes: 12 %
Neutrophils Absolute: 3.5 10*3/uL (ref 1.4–7.0)
Neutrophils: 58 %
Platelets: 262 10*3/uL (ref 150–450)
RBC: 4.38 x10E6/uL (ref 4.14–5.80)
RDW: 11.4 % — ABNORMAL LOW (ref 11.6–15.4)
WBC: 6 10*3/uL (ref 3.4–10.8)

## 2023-02-10 NOTE — Progress Notes (Signed)
CBC, CMP and CK are within normal limits.

## 2023-02-12 ENCOUNTER — Other Ambulatory Visit: Payer: Self-pay | Admitting: Physician Assistant

## 2023-02-12 ENCOUNTER — Other Ambulatory Visit (HOSPITAL_COMMUNITY): Payer: Self-pay

## 2023-02-12 DIAGNOSIS — M0600 Rheumatoid arthritis without rheumatoid factor, unspecified site: Secondary | ICD-10-CM

## 2023-02-12 MED ORDER — ENBREL MINI 50 MG/ML ~~LOC~~ SOCT
50.0000 mg | SUBCUTANEOUS | 2 refills | Status: DC
  Filled 2023-02-12: qty 4, 28d supply, fill #0
  Filled 2023-03-14: qty 4, 28d supply, fill #1
  Filled 2023-04-17: qty 4, 28d supply, fill #2

## 2023-02-12 NOTE — Telephone Encounter (Signed)
Next Visit: 03/19/2023  Last Visit: 10/18/2022  Last Fill: 11/08/2022  DX: Seronegative rheumatoid arthritis    Current Dose per office note 11/08/2022: Enbrel 50 mg sq injections once weekly    Labs: 02/08/2023 CBC, CMP and CK are within normal limits.   TB Gold:  06/01/2022 negative     Okay to refill Enbrel?

## 2023-02-14 ENCOUNTER — Other Ambulatory Visit (HOSPITAL_COMMUNITY): Payer: Self-pay

## 2023-02-15 ENCOUNTER — Other Ambulatory Visit (HOSPITAL_COMMUNITY): Payer: Self-pay

## 2023-02-18 ENCOUNTER — Other Ambulatory Visit: Payer: Self-pay

## 2023-02-18 ENCOUNTER — Other Ambulatory Visit (HOSPITAL_COMMUNITY): Payer: Self-pay

## 2023-02-19 ENCOUNTER — Other Ambulatory Visit (HOSPITAL_COMMUNITY): Payer: Self-pay

## 2023-02-20 ENCOUNTER — Other Ambulatory Visit: Payer: Self-pay

## 2023-02-21 DIAGNOSIS — F900 Attention-deficit hyperactivity disorder, predominantly inattentive type: Secondary | ICD-10-CM | POA: Diagnosis not present

## 2023-02-21 DIAGNOSIS — Z630 Problems in relationship with spouse or partner: Secondary | ICD-10-CM | POA: Diagnosis not present

## 2023-02-22 ENCOUNTER — Other Ambulatory Visit (HOSPITAL_COMMUNITY): Payer: Self-pay

## 2023-02-28 DIAGNOSIS — F900 Attention-deficit hyperactivity disorder, predominantly inattentive type: Secondary | ICD-10-CM | POA: Diagnosis not present

## 2023-02-28 DIAGNOSIS — Z630 Problems in relationship with spouse or partner: Secondary | ICD-10-CM | POA: Diagnosis not present
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
## 2023-03-12 ENCOUNTER — Other Ambulatory Visit (HOSPITAL_COMMUNITY): Payer: Self-pay

## 2023-03-14 ENCOUNTER — Other Ambulatory Visit: Payer: Self-pay

## 2023-03-14 ENCOUNTER — Other Ambulatory Visit (HOSPITAL_COMMUNITY): Payer: Self-pay

## 2023-03-16 DIAGNOSIS — F410 Panic disorder [episodic paroxysmal anxiety] without agoraphobia: Secondary | ICD-10-CM | POA: Diagnosis not present

## 2023-03-16 DIAGNOSIS — F900 Attention-deficit hyperactivity disorder, predominantly inattentive type: Secondary | ICD-10-CM | POA: Diagnosis not present

## 2023-03-16 DIAGNOSIS — F3342 Major depressive disorder, recurrent, in full remission: Secondary | ICD-10-CM | POA: Diagnosis not present

## 2023-03-18 ENCOUNTER — Other Ambulatory Visit: Payer: Self-pay | Admitting: Physical Medicine and Rehabilitation

## 2023-03-19 ENCOUNTER — Ambulatory Visit: Payer: BC Managed Care – PPO | Attending: Physician Assistant | Admitting: Physician Assistant

## 2023-03-19 ENCOUNTER — Encounter: Payer: Self-pay | Admitting: Physician Assistant

## 2023-03-19 VITALS — BP 110/70 | HR 54 | Resp 16 | Ht 72.0 in | Wt 178.8 lb

## 2023-03-19 DIAGNOSIS — M5030 Other cervical disc degeneration, unspecified cervical region: Secondary | ICD-10-CM

## 2023-03-19 DIAGNOSIS — M5136 Other intervertebral disc degeneration, lumbar region: Secondary | ICD-10-CM

## 2023-03-19 DIAGNOSIS — Z111 Encounter for screening for respiratory tuberculosis: Secondary | ICD-10-CM

## 2023-03-19 DIAGNOSIS — R5383 Other fatigue: Secondary | ICD-10-CM

## 2023-03-19 DIAGNOSIS — M19041 Primary osteoarthritis, right hand: Secondary | ICD-10-CM | POA: Diagnosis not present

## 2023-03-19 DIAGNOSIS — Z8639 Personal history of other endocrine, nutritional and metabolic disease: Secondary | ICD-10-CM

## 2023-03-19 DIAGNOSIS — F32A Depression, unspecified: Secondary | ICD-10-CM

## 2023-03-19 DIAGNOSIS — Z79899 Other long term (current) drug therapy: Secondary | ICD-10-CM

## 2023-03-19 DIAGNOSIS — M0600 Rheumatoid arthritis without rheumatoid factor, unspecified site: Secondary | ICD-10-CM

## 2023-03-19 DIAGNOSIS — Z8679 Personal history of other diseases of the circulatory system: Secondary | ICD-10-CM

## 2023-03-19 DIAGNOSIS — R748 Abnormal levels of other serum enzymes: Secondary | ICD-10-CM

## 2023-03-19 DIAGNOSIS — F419 Anxiety disorder, unspecified: Secondary | ICD-10-CM

## 2023-03-19 DIAGNOSIS — M19042 Primary osteoarthritis, left hand: Secondary | ICD-10-CM

## 2023-03-19 DIAGNOSIS — Z8659 Personal history of other mental and behavioral disorders: Secondary | ICD-10-CM

## 2023-03-19 DIAGNOSIS — M19071 Primary osteoarthritis, right ankle and foot: Secondary | ICD-10-CM | POA: Diagnosis not present

## 2023-03-19 DIAGNOSIS — M19072 Primary osteoarthritis, left ankle and foot: Secondary | ICD-10-CM

## 2023-03-19 DIAGNOSIS — Z87438 Personal history of other diseases of male genital organs: Secondary | ICD-10-CM

## 2023-03-19 NOTE — Patient Instructions (Signed)
Standing Labs We placed an order today for your standing lab work.   Please have your standing labs drawn in June and every 3 months   Please have your labs drawn 2 weeks prior to your appointment so that the provider can discuss your lab results at your appointment, if possible.  Please note that you may see your imaging and lab results in MyChart before we have reviewed them. We will contact you once all results are reviewed. Please allow our office up to 72 hours to thoroughly review all of the results before contacting the office for clarification of your results.  WALK-IN LAB HOURS  Monday through Thursday from 8:00 am -12:30 pm and 1:00 pm-5:00 pm and Friday from 8:00 am-12:00 pm.  Patients with office visits requiring labs will be seen before walk-in labs.  You may encounter longer than normal wait times. Please allow additional time. Wait times may be shorter on  Monday and Thursday afternoons.  We do not book appointments for walk-in labs. We appreciate your patience and understanding with our staff.   Labs are drawn by [facility]. Please bring your co-pay at the time of your lab draw.  You may receive a bill from [facility] for your lab work.  Please note if you are on Hydroxychloroquine and and an order has been placed for a Hydroxychloroquine level,  you will need to have it drawn 4 hours or more after your last dose.  If you wish to have your labs drawn at another location, please call the office 24 hours in advance so we can fax the orders.  The office is located at [address], [address], [place], [place] [address]   If you have any questions regarding directions or hours of operation,  please call [phone].   As a reminder, please drink plenty of water prior to coming for your lab work. Thanks!  

## 2023-03-19 NOTE — Addendum Note (Signed)
Addended by: Ellen Henri on: 03/19/2023 04:29 PM   Modules accepted: Orders

## 2023-03-28 DIAGNOSIS — F900 Attention-deficit hyperactivity disorder, predominantly inattentive type: Secondary | ICD-10-CM | POA: Diagnosis not present

## 2023-03-28 DIAGNOSIS — Z630 Problems in relationship with spouse or partner: Secondary | ICD-10-CM | POA: Diagnosis not present

## 2023-04-11 DIAGNOSIS — F900 Attention-deficit hyperactivity disorder, predominantly inattentive type: Secondary | ICD-10-CM | POA: Diagnosis not present

## 2023-04-11 DIAGNOSIS — Z630 Problems in relationship with spouse or partner: Secondary | ICD-10-CM | POA: Diagnosis not present

## 2023-04-16 ENCOUNTER — Other Ambulatory Visit (HOSPITAL_COMMUNITY): Payer: Self-pay

## 2023-04-17 ENCOUNTER — Other Ambulatory Visit (HOSPITAL_COMMUNITY): Payer: Self-pay

## 2023-04-18 ENCOUNTER — Other Ambulatory Visit (HOSPITAL_COMMUNITY): Payer: Self-pay

## 2023-05-01 ENCOUNTER — Encounter: Payer: Self-pay | Admitting: Cardiology

## 2023-05-01 ENCOUNTER — Ambulatory Visit: Payer: BC Managed Care – PPO | Attending: Cardiology | Admitting: Cardiology

## 2023-05-01 VITALS — BP 110/66 | HR 58 | Ht 72.0 in | Wt 176.6 lb

## 2023-05-01 DIAGNOSIS — I471 Supraventricular tachycardia, unspecified: Secondary | ICD-10-CM | POA: Diagnosis not present

## 2023-05-01 MED ORDER — DILTIAZEM HCL 30 MG PO TABS: 30.0000 mg | ORAL_TABLET | Freq: Four times a day (QID) | ORAL | 3 refills | Status: DC | PRN
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
## 2023-05-01 NOTE — Patient Instructions (Signed)
Medication Instructions:  Your physician has recommended you make the following change in your medication:  Diltiazem 30 mg - take 1 tablet every 6 hours as needed for elevated heart rates  *If you need a refill on your cardiac medications before your next appointment, please call your pharmacy*   Lab Work: None ordered If you have labs (blood work) drawn today and your tests are completely normal, you will receive your results only by: MyChart Message (if you have MyChart) OR A paper copy in the mail If you have any lab test that is abnormal or we need to change your treatment, we will call you to review the results.   Testing/Procedures: None ordered   Follow-Up: At [facility], you and your health needs are our priority.  As part of our continuing mission to provide you with exceptional heart care, we have created designated Provider Care Teams.  These Care Teams include your primary Cardiologist (physician) and Advanced Practice Providers (APPs -  Physician Assistants and Nurse Practitioners) who all work together to provide you with the care you need, when you need it.  Your next appointment:   6 month(s)  The format for your next appointment:   In Person  Provider:   Casimiro Needle "Mardelle Matte" Lanna Poche, PA-C    Thank you for choosing [facility]!!   Dory Horn, RN [phone]

## 2023-05-02 ENCOUNTER — Other Ambulatory Visit: Payer: Self-pay | Admitting: *Deleted

## 2023-05-02 DIAGNOSIS — Z111 Encounter for screening for respiratory tuberculosis: Secondary | ICD-10-CM

## 2023-05-02 DIAGNOSIS — R748 Abnormal levels of other serum enzymes: Secondary | ICD-10-CM

## 2023-05-02 DIAGNOSIS — Z79899 Other long term (current) drug therapy: Secondary | ICD-10-CM

## 2023-05-02 NOTE — Addendum Note (Signed)
Addended by: Audrie Lia on: 05/02/2023 11:04 AM   Modules accepted: Orders

## 2023-05-04 LAB — CMP14+EGFR
ALT: 20 IU/L (ref 0–44)
Alkaline Phosphatase: 84 IU/L (ref 44–121)
Chloride: 101 mmol/L (ref 96–106)
Sodium: 139 mmol/L (ref 134–144)

## 2023-05-05 LAB — CMP14+EGFR
Albumin/Globulin Ratio: 1.7 (ref 1.2–2.2)
BUN/Creatinine Ratio: 13 (ref 10–24)
Bilirubin Total: 0.5 mg/dL (ref 0.0–1.2)
CO2: 27 mmol/L (ref 20–29)
Globulin, Total: 2.6 g/dL (ref 1.5–4.5)
Glucose: 90 mg/dL (ref 70–99)
Potassium: 5.5 mmol/L — ABNORMAL HIGH (ref 3.5–5.2)
eGFR: 70 mL/min/{1.73_m2} (ref >59)

## 2023-05-08 DIAGNOSIS — Z79899 Other long term (current) drug therapy: Secondary | ICD-10-CM | POA: Diagnosis not present

## 2023-05-08 DIAGNOSIS — F902 Attention-deficit hyperactivity disorder, combined type: Secondary | ICD-10-CM | POA: Diagnosis not present

## 2023-05-09 DIAGNOSIS — F900 Attention-deficit hyperactivity disorder, predominantly inattentive type: Secondary | ICD-10-CM | POA: Diagnosis not present

## 2023-05-09 DIAGNOSIS — Z630 Problems in relationship with spouse or partner: Secondary | ICD-10-CM | POA: Diagnosis not present

## 2023-05-09 LAB — CK: Total CK: 106 U/L (ref 41–331)

## 2023-05-09 LAB — QUANTIFERON-TB GOLD PLUS
QuantiFERON Mitogen Value: >10 IU/mL
QuantiFERON Nil Value: 0.03 IU/mL
QuantiFERON TB1 Ag Value: 0.02 IU/mL
QuantiFERON TB2 Ag Value: 0.03 IU/mL
QuantiFERON-TB Gold Plus: NEGATIVE

## 2023-05-09 LAB — CBC WITH DIFFERENTIAL/PLATELET
Basophils Absolute: 0.1 10*3/uL (ref 0.0–0.2)
Basos: 1 %
EOS (ABSOLUTE): 0.3 10*3/uL (ref 0.0–0.4)
Eos: 4 %
Hematocrit: 42 % (ref 37.5–51.0)
Hemoglobin: 14.3 g/dL (ref 13.0–17.7)
Immature Grans (Abs): 0 10*3/uL (ref 0.0–0.1)
Immature Granulocytes: 0 %
Lymphocytes Absolute: 1.9 10*3/uL (ref 0.7–3.1)
Lymphs: 30 %
MCH: 31.9 pg (ref 26.6–33.0)
MCHC: 34 g/dL (ref 31.5–35.7)
MCV: 94 fL (ref 79–97)
Monocytes Absolute: 0.8 10*3/uL (ref 0.1–0.9)
Monocytes: 13 %
Neutrophils Absolute: 3.3 10*3/uL (ref 1.4–7.0)
Neutrophils: 52 %
Platelets: 216 10*3/uL (ref 150–450)
RBC: 4.48 x10E6/uL (ref 4.14–5.80)
RDW: 12.3 % (ref 11.6–15.4)
WBC: 6.3 10*3/uL (ref 3.4–10.8)

## 2023-05-09 LAB — CMP14+EGFR
AST: 19 IU/L (ref 0–40)
Albumin: 4.4 g/dL (ref 3.9–4.9)
BUN: 15 mg/dL (ref 8–27)
Calcium: 9.6 mg/dL (ref 8.6–10.2)
Creatinine, Ser: 1.17 mg/dL (ref 0.76–1.27)
Total Protein: 7 g/dL (ref 6.0–8.5)

## 2023-05-09 NOTE — Progress Notes (Signed)
CBC and CMP are normal.  Potassium is high at 5.5.  Most likely hemolyzed sample.  I do not see potassium supplement on his medication list.  Please forward results to his PCP.

## 2023-05-09 NOTE — Progress Notes (Signed)
TB gold negative

## 2023-05-10 ENCOUNTER — Telehealth: Payer: Self-pay | Admitting: Pharmacist

## 2023-05-10 ENCOUNTER — Encounter: Payer: Self-pay | Admitting: Rheumatology

## 2023-05-10 NOTE — Telephone Encounter (Signed)
Patient will become newly enrolled into Medicare on 06/03/2023. Will need to run BIV through new insurance on this date.  MyChart message sent back to pt to determine how they would like [employer] PAP application sent to them  Chesley Mires, PharmD, MPH, BCPS, CPP Clinical Pharmacist (Rheumatology and Pulmonology)
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
## 2023-05-13 ENCOUNTER — Ambulatory Visit (INDEPENDENT_AMBULATORY_CARE_PROVIDER_SITE_OTHER): Payer: BC Managed Care – PPO | Admitting: Neurology

## 2023-05-13 ENCOUNTER — Institutional Professional Consult (permissible substitution): Payer: BC Managed Care – PPO | Admitting: Diagnostic Neuroimaging

## 2023-05-13 ENCOUNTER — Encounter: Payer: Self-pay | Admitting: Neurology

## 2023-05-13 VITALS — BP 108/67 | HR 77 | Ht 72.0 in | Wt 179.0 lb

## 2023-05-13 DIAGNOSIS — F988 Other specified behavioral and emotional disorders with onset usually occurring in childhood and adolescence: Secondary | ICD-10-CM

## 2023-05-13 DIAGNOSIS — R413 Other amnesia: Secondary | ICD-10-CM | POA: Diagnosis not present

## 2023-05-13 DIAGNOSIS — R0683 Snoring: Secondary | ICD-10-CM

## 2023-05-13 DIAGNOSIS — G4719 Other hypersomnia: Secondary | ICD-10-CM

## 2023-05-13 DIAGNOSIS — M069 Rheumatoid arthritis, unspecified: Secondary | ICD-10-CM

## 2023-05-13 MED ORDER — ARMODAFINIL 150 MG PO TABS: 150.0000 mg | ORAL_TABLET | Freq: Every morning | ORAL | 5 refills | Status: DC

## 2023-05-14 ENCOUNTER — Other Ambulatory Visit (HOSPITAL_COMMUNITY): Payer: Self-pay

## 2023-05-15 ENCOUNTER — Other Ambulatory Visit: Payer: Self-pay | Admitting: Physician Assistant

## 2023-05-15 ENCOUNTER — Other Ambulatory Visit (HOSPITAL_COMMUNITY): Payer: Self-pay

## 2023-05-15 ENCOUNTER — Telehealth: Payer: Self-pay | Admitting: Neurology

## 2023-05-15 ENCOUNTER — Other Ambulatory Visit: Payer: Self-pay

## 2023-05-15 ENCOUNTER — Other Ambulatory Visit: Payer: Self-pay | Admitting: Neurology

## 2023-05-15 DIAGNOSIS — M0600 Rheumatoid arthritis without rheumatoid factor, unspecified site: Secondary | ICD-10-CM

## 2023-05-15 MED ORDER — ARMODAFINIL 150 MG PO TABS: 150.0000 mg | ORAL_TABLET | Freq: Every day | ORAL | 5 refills | Status: DC

## 2023-05-15 MED ORDER — ENBREL MINI 50 MG/ML ~~LOC~~ SOCT
50.0000 mg | SUBCUTANEOUS | 2 refills | Status: DC
Start: 2023-05-15 — End: 2023-08-27
  Filled 2023-05-15: qty 4, 28d supply, fill #0
  Filled 2023-06-13 – 2023-07-05 (×2): qty 4, 28d supply, fill #1
  Filled 2023-07-30: qty 4, 28d supply, fill #2

## 2023-05-15 NOTE — Telephone Encounter (Signed)
Wife reports that Paul [name] was supposed to be called in to [employer], but it was not for whatever reason.  Wife is asking if Paul [name] can be called into [facility]

## 2023-05-15 NOTE — Telephone Encounter (Signed)
Last Fill: 02/12/2023  Labs: 05/03/2023 CBC and CMP are normal.  Potassium is high at 5.5.  Most likely hemolyzed sample.   TB Gold: 05/03/2023 Neg    Next Visit: 08/29/2023  Last Visit: 03/19/2023  DX: Seronegative rheumatoid arthritis   Current Dose per office note 03/19/2023: Enbrel 50 mg sq injections once weekly   Okay to refill Enbrel?

## 2023-05-15 NOTE — Telephone Encounter (Signed)
Called wife and let her know that her husbands Ronne Binning has been sent to [facility] today 05/15/2023. Pt's wife asked who would be calling for pt's sleep study and told her [facility]. Pt's wife verbalized understanding.

## 2023-05-15 NOTE — Telephone Encounter (Signed)
Received signed patient form from pt's wife via email  Chesley Mires, PharmD, MPH, BCPS, CPP Clinical Pharmacist (Rheumatology and Pulmonology)

## 2023-05-15 NOTE — Telephone Encounter (Signed)
Pt scheduled for 30 mins MR brain wo contrast at [facility] for 05/21/23 at 10am  [employer] auth# [id number] (05/15/23-06/13/23)

## 2023-05-15 NOTE — Telephone Encounter (Addendum)
Going to get VO from Dr. Epimenio Foot on medication

## 2023-05-16 ENCOUNTER — Other Ambulatory Visit: Payer: Self-pay

## 2023-05-16 ENCOUNTER — Other Ambulatory Visit (HOSPITAL_COMMUNITY): Payer: Self-pay

## 2023-05-16 DIAGNOSIS — F900 Attention-deficit hyperactivity disorder, predominantly inattentive type: Secondary | ICD-10-CM | POA: Diagnosis not present

## 2023-05-16 DIAGNOSIS — Z630 Problems in relationship with spouse or partner: Secondary | ICD-10-CM | POA: Diagnosis not present

## 2023-05-21 ENCOUNTER — Ambulatory Visit (INDEPENDENT_AMBULATORY_CARE_PROVIDER_SITE_OTHER): Payer: BC Managed Care – PPO

## 2023-05-21 DIAGNOSIS — R413 Other amnesia: Secondary | ICD-10-CM

## 2023-05-27 ENCOUNTER — Telehealth: Payer: Self-pay | Admitting: Neurology

## 2023-05-27 NOTE — Telephone Encounter (Signed)
*  PENDING NEW INSURANCE* - [employer] ID: [id number]   Patient is scheduled at [facility] for 06/04/23 at 9:45 AM.  Mailed packet to the patient.

## 2023-05-29 NOTE — Telephone Encounter (Signed)
PAP application scanned to Onbase to be ready for submission once PA is approved through Medicare plan on or after 06/03/23

## 2023-05-30 DIAGNOSIS — Z630 Problems in relationship with spouse or partner: Secondary | ICD-10-CM | POA: Diagnosis not present

## 2023-05-30 DIAGNOSIS — F900 Attention-deficit hyperactivity disorder, predominantly inattentive type: Secondary | ICD-10-CM | POA: Diagnosis not present

## 2023-06-03 NOTE — Telephone Encounter (Signed)
HTA- pending faxed notes.  HST- [employer] Berkley Harvey: [id number] (exp. 05/14/23 to 07/12/23)

## 2023-06-04 ENCOUNTER — Other Ambulatory Visit (HOSPITAL_COMMUNITY): Payer: Self-pay

## 2023-06-04 ENCOUNTER — Ambulatory Visit: Payer: PPO | Admitting: Neurology

## 2023-06-04 DIAGNOSIS — G4719 Other hypersomnia: Secondary | ICD-10-CM

## 2023-06-04 DIAGNOSIS — R0683 Snoring: Secondary | ICD-10-CM

## 2023-06-04 DIAGNOSIS — G4733 Obstructive sleep apnea (adult) (pediatric): Secondary | ICD-10-CM

## 2023-06-04 NOTE — Telephone Encounter (Signed)
PA still spinning in CMMs. Faxed to plan #[phone] Phone#[phone]

## 2023-06-04 NOTE — Telephone Encounter (Signed)
Approval scanned into Media- Provider portion of PAP application is missing

## 2023-06-04 NOTE — Telephone Encounter (Signed)
Submitted a Prior Authorization request to [employer]/ [employer] Advance for ENBREL via CoverMyMeds. Will update once we receive a response.  KeyEdison Pace - PA Case ID: [id number]

## 2023-06-04 NOTE — Telephone Encounter (Signed)
Patient Advocate Encounter  Prior Authorization for ENBREL has been approved with RX Advance/ HTA.    PA# [id number] Effective dates: 06/04/23 through 12/05/23

## 2023-06-05 NOTE — Telephone Encounter (Signed)
HST- HTA Berkley Harvey: [id number] (exp. 06/03/23 to 08/04/23)

## 2023-06-05 NOTE — Progress Notes (Signed)
   [facility]  HOME SLEEP STUDY  STUDY DATE: 06/04/2023 PATIENT NAME: Paul [name] DOB: 1956/05/10 MRN: [id number]  ORDERING CLINICIAN: Richard A. Epimenio Foot, MD, PhD Interpreting CLINICIAN: Richard A. Epimenio Foot, MD. PhD   CLINICAL INFORMATION: 67 year old man with snoring and excessive daytime sleepiness.  The Epworth Sleepiness Scale score was 10/24 (mild excessive daytime sleepiness)   IMPRESSION:  Moderate obstructive sleep apnea with a pAHI 3% of 20.0/h. Negligible nocturnal hypoxemia with only 0.9 minutes with an SaO2 less than or equal to 88% Mild reduced sleep efficiency of 78%   RECOMMENDATION: For his moderate obstructive sleep apnea, AutoPap with a pressure range of 5 to 15 cm H2O and heated humidifier is recommended.  Download in 30 to 90 days. Follow-up with Dr. Epimenio Foot   INTERPRETING PHYSICIAN:   Pearletha Furl. Epimenio Foot, MD, PhD, [facility] Certified in Neurology, Clinical Neurophysiology, Sleep Medicine, Pain Medicine and Neuroimaging  [facility] [address] [place], [place] [address] [phone]

## 2023-06-11 ENCOUNTER — Other Ambulatory Visit: Payer: Self-pay | Admitting: Physical Medicine and Rehabilitation

## 2023-06-11 DIAGNOSIS — I2510 Atherosclerotic heart disease of native coronary artery without angina pectoris: Secondary | ICD-10-CM | POA: Diagnosis not present

## 2023-06-11 DIAGNOSIS — F331 Major depressive disorder, recurrent, moderate: Secondary | ICD-10-CM | POA: Diagnosis not present

## 2023-06-11 DIAGNOSIS — I471 Supraventricular tachycardia, unspecified: Secondary | ICD-10-CM | POA: Diagnosis not present

## 2023-06-11 DIAGNOSIS — F909 Attention-deficit hyperactivity disorder, unspecified type: Secondary | ICD-10-CM | POA: Diagnosis not present

## 2023-06-12 ENCOUNTER — Telehealth: Payer: Self-pay | Admitting: *Deleted

## 2023-06-12 ENCOUNTER — Other Ambulatory Visit (HOSPITAL_COMMUNITY): Payer: Self-pay

## 2023-06-12 DIAGNOSIS — G4733 Obstructive sleep apnea (adult) (pediatric): Secondary | ICD-10-CM

## 2023-06-12 NOTE — Telephone Encounter (Signed)
-----   Message from Asa Lente, MD sent at 06/08/2023  4:59 PM EDT ----- Regarding: Home sleep study He has moderate obstructive sleep apnea.  I would like to order AutoPap pressure range 5 to 15 cm H2O with heated humidifier.  Download and follow-up with [place] in 2 to 3 months.

## 2023-06-12 NOTE — Telephone Encounter (Signed)
Patient informed with below, he has a new insurance card with [employer]. I asked him to upload to chart. Pt said his wife will have to help him. Once insurance card uploaded I will proceed with sending order to [facility] via community message. Order has been placed in epic for cpap.

## 2023-06-13 ENCOUNTER — Telehealth: Payer: Self-pay | Admitting: *Deleted

## 2023-06-13 ENCOUNTER — Other Ambulatory Visit (HOSPITAL_COMMUNITY): Payer: Self-pay

## 2023-06-13 ENCOUNTER — Telehealth: Payer: Self-pay

## 2023-06-13 ENCOUNTER — Encounter: Payer: Self-pay | Admitting: Neurology

## 2023-06-13 DIAGNOSIS — E785 Hyperlipidemia, unspecified: Secondary | ICD-10-CM

## 2023-06-13 MED ORDER — REPATHA SURECLICK 140 MG/ML ~~LOC~~ SOAJ
SUBCUTANEOUS | 11 refills | Status: DC
Start: 2023-06-13 — End: 2023-12-24
  Filled 2023-06-13: qty 2, 28d supply, fill #0

## 2023-06-13 NOTE — Telephone Encounter (Signed)
Signed provider form received from Sherron Ales, PA-C. Submitted Patient Assistance Application to [employer] for ENBREL along with provider portion, patient portion, med list,insurance card copy, PA. Will update patient when we receive a response.  Phone #: [phone] Fax #: [phone]  Chesley Mires, PharmD, MPH, BCPS, CPP Clinical Pharmacist (Rheumatology and Pulmonology)

## 2023-06-13 NOTE — Telephone Encounter (Signed)
Pharmacy Patient Advocate Encounter   Received notification from Pharm[employer] that prior authorization for Repatha is required/requested.   PA submitted to [employer]/ Rx Advance via CoverMyMeds [facility] or [facility]) confirmation # [id number]   Status is pending

## 2023-06-13 NOTE — Telephone Encounter (Signed)
Patient scheduled on 09/02/23 for initial cpap visit.

## 2023-06-13 NOTE — Telephone Encounter (Signed)
Patient has uploaded his new insurance card, message sent to [employer] for new start cpap.   Phone room please schedule patient a initial cpap follow up visit, after 07/29/23.

## 2023-06-13 NOTE — Telephone Encounter (Signed)
Pharmacy Patient Advocate Encounter  Received notification from [employer]/ [employer] Advance that Prior Authorization for REPATHA has been APPROVED from 7.11.24 to 1.11.25.Paul [name]

## 2023-06-13 NOTE — Telephone Encounter (Signed)
Pt wife said patient Armodanfinil 150 mg tablet needs a PA. He has [employer] advantage insurance now, new insurance card has been uploaded.

## 2023-06-13 NOTE — Addendum Note (Signed)
Addended by: Cheree Ditto on: 06/13/2023 04:50 PM   Modules accepted: Orders

## 2023-06-13 NOTE — Telephone Encounter (Signed)
PA request has been Submitted. New Encounter created for follow up. For additional info see Pharmacy Prior Auth telephone encounter from 06/13/2023.

## 2023-06-13 NOTE — Telephone Encounter (Signed)
Pt has been scheduled an Initial Cpap appt.

## 2023-06-13 NOTE — Telephone Encounter (Signed)
Pharmacy Patient Advocate Encounter   Received notification from Physician's Office that prior authorization for Armodafinil 150MG  tablets is required/requested.   Insurance verification completed.   The patient is insured through [employer]/ Rx Advance .    YQM:[id number]

## 2023-06-14 NOTE — Telephone Encounter (Signed)
Pharmacy Patient Advocate Encounter  Received notification from [employer]/ [employer] Advance that Prior Authorization for Armodafinil 150MG  tablets has been DENIED because see below.      PA #/Case ID/Reference #: PA Case ID #: [id number]   Please be advised we currently do not have a Pharmacist to review denials, therefore you will need to process appeals accordingly as needed. Thanks for your support at this time. Contact for appeals are as follows: Phone: [phone], Fax: [phone]  The denial letter has been placed in the chart under the media tab.

## 2023-06-17 NOTE — Telephone Encounter (Signed)
Received fax from [employer] requesting proof of household income and PA approval for most recent year. This was faxed with PAP application. [employer] rep confirmed they see PA on file.  Email sent to pt's wife regarding proof of household income that can be sent directly to [employer] or to clinic's pharmacy team which we can fax on his behalf.  Chesley Mires, PharmD, MPH, BCPS, CPP Clinical Pharmacist (Rheumatology and Pulmonology)

## 2023-06-17 NOTE — Telephone Encounter (Signed)
(  UPDATE)   The pts ''TIER EXCEPTION'' Was Denied   (See denial letter in pts chart)

## 2023-06-18 MED ORDER — ARMODAFINIL 150 MG PO TABS: 150.0000 mg | ORAL_TABLET | Freq: Every morning | ORAL | 0 refills | Status: DC

## 2023-06-18 NOTE — Telephone Encounter (Signed)
I called insurance co, [employer], [phone], spoke to [place], verbal appeal started OSA G47.33, EDS G47.19 Case ID [id number]. Fax Sleep study / OV to [phone].   Fax confirmed received. Determination pending.

## 2023-06-19 ENCOUNTER — Other Ambulatory Visit: Payer: Self-pay | Admitting: Neurology

## 2023-06-19 MED ORDER — ARMODAFINIL 150 MG PO TABS: 150.0000 mg | ORAL_TABLET | Freq: Every morning | ORAL | 1 refills | Status: DC

## 2023-06-19 NOTE — Telephone Encounter (Signed)
Received approval for Armodafinil 150mg  tablets daily 06-18-2023 thru 12-19-2023.  [employer]   Adv. Request ID [id number].   MEMB ID [id number].

## 2023-06-24 NOTE — Telephone Encounter (Signed)
Received email from patient's wife with proof of household income and denial for SS Extra Help  Note from pt's wife: It's important that a message be included with the paperwork noting that Paul [name]'s income from 2023 was from "third party sick pay"  from a disability income policy which will end this month.  Faxed docs to [employer] Phone #: [phone] Fax #: [phone]  Chesley Mires, PharmD, MPH, BCPS, CPP Clinical Pharmacist (Rheumatology and Pulmonology)

## 2023-07-03 NOTE — Telephone Encounter (Signed)
Called [employer] for update on patient's Enbrel PAP application. Per rep, patient's application has been DENIED due to exceeding household income of being greater than 300% [employer]. Letter sent to patent's home, and I requested rep refax denial letter to our clinic   Phone #: [phone] Fax #: [phone]  Chesley Mires, PharmD, MPH, BCPS, CPP Clinical Pharmacist (Rheumatology and Pulmonology)

## 2023-07-05 ENCOUNTER — Other Ambulatory Visit (HOSPITAL_COMMUNITY): Payer: Self-pay

## 2023-07-05 NOTE — Telephone Encounter (Signed)
Patient enrolled into RA grant through PAF: Balance: $4000 Award Period: 01/06/2023 - 07/04/2024 ID: [id number] BIN: [id number] PCN: [id number] Group: [id number] For pharmacy inquiries, contact PDMI at [phone]. For patient inquiries, contact PAF at [phone].  I spoke with patient's wife, [name], who expressed gratitude for this. She states she has been significantly frustrated with the back-and-forth with [employer]. Last phone call with [employer] left her in tears.  We will likely have to revisit applying through [employer] next year as this year's income documents have placed his application through multiple stages of review with no success yet.  Spoke with Tamra at [employer]. She will schedule refill with patient's wife. They usually pick up medication.  Chesley Mires, PharmD, MPH, BCPS, CPP Clinical Pharmacist (Rheumatology and Pulmonology)

## 2023-07-08 ENCOUNTER — Other Ambulatory Visit (HOSPITAL_COMMUNITY): Payer: Self-pay

## 2023-07-12 ENCOUNTER — Emergency Department (HOSPITAL_COMMUNITY)
Admission: EM | Admit: 2023-07-12 | Discharge: 2023-07-12 | Disposition: A | Discharge status: Home / Self Care | Payer: PPO | Attending: Emergency Medicine | Admitting: Emergency Medicine

## 2023-07-12 ENCOUNTER — Other Ambulatory Visit (HOSPITAL_COMMUNITY): Payer: Self-pay

## 2023-07-12 ENCOUNTER — Encounter (HOSPITAL_COMMUNITY): Payer: Self-pay

## 2023-07-12 ENCOUNTER — Emergency Department (HOSPITAL_COMMUNITY): Payer: PPO

## 2023-07-12 ENCOUNTER — Other Ambulatory Visit: Payer: Self-pay

## 2023-07-12 DIAGNOSIS — R0789 Other chest pain: Secondary | ICD-10-CM

## 2023-07-12 DIAGNOSIS — Z7982 Long term (current) use of aspirin: Secondary | ICD-10-CM | POA: Diagnosis not present

## 2023-07-12 DIAGNOSIS — E039 Hypothyroidism, unspecified: Secondary | ICD-10-CM | POA: Diagnosis not present

## 2023-07-12 DIAGNOSIS — R079 Chest pain, unspecified: Secondary | ICD-10-CM | POA: Diagnosis not present

## 2023-07-12 DIAGNOSIS — Z79899 Other long term (current) drug therapy: Secondary | ICD-10-CM | POA: Diagnosis not present

## 2023-07-12 DIAGNOSIS — R202 Paresthesia of skin: Secondary | ICD-10-CM | POA: Insufficient documentation

## 2023-07-12 DIAGNOSIS — I959 Hypotension, unspecified: Secondary | ICD-10-CM | POA: Diagnosis not present

## 2023-07-12 DIAGNOSIS — I10 Essential (primary) hypertension: Secondary | ICD-10-CM | POA: Diagnosis not present

## 2023-07-12 LAB — TROPONIN I (HIGH SENSITIVITY)
Troponin I (High Sensitivity): 6 ng/L (ref <18)
Troponin I (High Sensitivity): 6 ng/L (ref <18)

## 2023-07-12 LAB — BASIC METABOLIC PANEL
Anion gap: 9 (ref 5–15)
BUN: 20 mg/dL (ref 8–23)
CO2: 22 mmol/L (ref 22–32)
Calcium: 8.5 mg/dL — ABNORMAL LOW (ref 8.9–10.3)
Chloride: 103 mmol/L (ref 98–111)
Creatinine, Ser: 1.16 mg/dL (ref 0.61–1.24)
GFR, Estimated: >60 mL/min (ref >60)
Glucose, Bld: 103 mg/dL — ABNORMAL HIGH (ref 70–99)
Potassium: 4.1 mmol/L (ref 3.5–5.1)
Sodium: 134 mmol/L — ABNORMAL LOW (ref 135–145)

## 2023-07-12 LAB — CBC
HCT: 35.9 % — ABNORMAL LOW (ref 39.0–52.0)
Hemoglobin: 11.7 g/dL — ABNORMAL LOW (ref 13.0–17.0)
MCH: 31.3 pg (ref 26.0–34.0)
MCHC: 32.6 g/dL (ref 30.0–36.0)
MCV: 96 fL (ref 80.0–100.0)
Platelets: 208 10*3/uL (ref 150–400)
RBC: 3.74 MIL/uL — ABNORMAL LOW (ref 4.22–5.81)
RDW: 12.8 % (ref 11.5–15.5)
WBC: 8.2 10*3/uL (ref 4.0–10.5)
nRBC: 0 % (ref 0.0–0.2)

## 2023-07-12 MED ORDER — IBUPROFEN 800 MG PO TABS
800.0000 mg | ORAL_TABLET | Freq: Once | ORAL | Status: AC
  Administered 2023-07-12: 800 mg via ORAL
  Filled 2023-07-12: qty 1

## 2023-07-12 MED ORDER — METHOCARBAMOL 500 MG PO TABS
500.0000 mg | ORAL_TABLET | Freq: Once | ORAL | Status: AC
  Administered 2023-07-12: 500 mg via ORAL
  Filled 2023-07-12: qty 1

## 2023-07-12 MED ORDER — METHOCARBAMOL 500 MG PO TABS: 500.0000 mg | ORAL_TABLET | Freq: Two times a day (BID) | ORAL | 0 refills | Status: DC | PRN

## 2023-07-12 MED ORDER — ASPIRIN 81 MG PO CHEW
324.0000 mg | CHEWABLE_TABLET | Freq: Once | ORAL | Status: AC
  Administered 2023-07-12: 324 mg via ORAL
  Filled 2023-07-12: qty 4

## 2023-07-12 MED ORDER — SODIUM CHLORIDE 0.9 % IV BOLUS
1000.0000 mL | Freq: Once | INTRAVENOUS | Status: AC
  Administered 2023-07-12: 1000 mL via INTRAVENOUS

## 2023-07-12 MED ORDER — METHOCARBAMOL 500 MG PO TABS
500.0000 mg | ORAL_TABLET | Freq: Two times a day (BID) | ORAL | 0 refills | Status: DC | PRN
  Filled 2023-07-12: qty 14, 7d supply, fill #0

## 2023-07-12 MED ORDER — LIDOCAINE 5 % EX PTCH
1.0000 | MEDICATED_PATCH | Freq: Once | CUTANEOUS | Status: DC
  Administered 2023-07-12: 1 via TRANSDERMAL
  Filled 2023-07-12: qty 1

## 2023-07-12 NOTE — Discharge Instructions (Addendum)
As discussed, your labs and imaging did not show any indication of heart attack. You can take Robaxin twice a day as needed for pain since that seemed to help the most today. You can also try Diclofenac cream on your chest for relief. Please call your cardiologist on Monday for reevaluation of your symptoms.  Get help right away if: Your chest pain is worse. You have a cough that gets worse, or you cough up blood. You have very bad (severe) pain in your belly (abdomen). You pass out (faint). You have either of these for no clear reason: Sudden chest discomfort. Sudden discomfort in your arms, back, neck, or jaw. You have shortness of breath at any time. You suddenly start to sweat, or your skin gets clammy. You feel sick to your stomach (nauseous). You throw up (vomit). You suddenly feel lightheaded or dizzy. You feel very weak or tired. Your heart starts to beat fast, or it feels like it is skipping beats.

## 2023-07-12 NOTE — ED Provider Notes (Signed)
Paul [name] Provider Note   CSN: [id number] Arrival date & time: 07/12/23  1516     History  Chief Complaint  Patient presents with   Chest Pain    Paul [name] is a 67 y.o. male with a history of hyperlipidemia, SVT, hypothyroidism, rheumatoid arthritis, and osteoarthritis who presents to the ED today via EMS for chest pain. The pain started today when he was sitting on the couch at home several hours prior to arrival, when he developed pain in the center or his sternum. He has associated right arm tingling but denies He attempted vagal maneuvers, which have helped before with his arrhythmia, with no relief.    Home Medications Prior to Admission medications   Medication Sig Start Date End Date Taking? Authorizing Provider  Armodafinil 150 MG tablet Take 1 tablet (150 mg total) by mouth in the morning. 06/19/23  Yes Sater, Pearletha Furl, MD  aspirin EC 81 MG tablet Take 1 tablet (81 mg total) by mouth daily. 07/13/16  Yes Wendall Stade, MD  Blood Glucose Monitoring Suppl (CONTOUR NEXT MONITOR) w/Device KIT Test blood sugar as needed Patient taking differently: 1 each by Other route See admin instructions. Test blood sugar as needed 04/28/21  Yes Wendall Stade, MD  buPROPion (WELLBUTRIN XL) 150 MG 24 hr tablet Take 150 mg by mouth every morning. 03/11/23  Yes [provider]  cholecalciferol (VITAMIN D) 1000 units tablet Take 2,000 Units by mouth daily.   Yes [provider]  Cyanocobalamin (VITAMIN B-12 PO) Take by mouth daily.   Yes [provider]  diclofenac sodium (VOLTAREN) 1 % GEL Apply 3 grams to 3 large joints up to 3 times daily 12/17/18  Yes Gearldine Bienenstock, PA-C  diltiazem (CARDIZEM) 30 MG tablet Take 1 tablet (30 mg total) by mouth 4 (four) times daily as needed (elevated heart rates). 05/01/23  Yes Camnitz, Will Daphine Deutscher, MD  DULoxetine (CYMBALTA) 60 MG capsule Take 60 mg by mouth daily.   Yes [provider]  etanercept (ENBREL MINI) 50 MG/ML injection INJECT 50 MG INTO THE SKIN ONCE A WEEK. 05/15/23 05/14/24 Yes Gearldine Bienenstock, PA-C  Evolocumab (REPATHA SURECLICK) 140 MG/ML SOAJ INJECT 140 MG into THE SKIN EVERY 14 DAYS Patient taking differently: Inject 140 mg as directed See admin instructions. INJECT 140 MG into THE SKIN EVERY 14 DAYS 06/13/23  Yes Wendall Stade, MD  Ferrous Sulfate (IRON) 325 (65 Fe) MG TABS Take by mouth daily.   Yes [provider]  gabapentin (NEURONTIN) 300 MG capsule TAKE 1 CAPSULE BY MOUTH 2 TIMES DAILY 06/11/23  Yes Tyrell Antonio, MD  glucose blood test strip Test blood sugar once daily as needed Patient taking differently: 1 each by Other route See admin instructions. Test blood sugar once daily as needed 05/14/22  Yes Pavero, Cristal Deer, [facility]  levothyroxine (SYNTHROID) 88 MCG tablet Take 88 mcg by mouth AC breakfast.   Yes [provider]  methocarbamol (ROBAXIN) 500 MG tablet Take 1 tablet (500 mg total) by mouth 2 (two) times daily as needed for up to 7 days for muscle spasms. 07/12/23 07/19/23 Yes Maxwell Marion, PA-C  metoprolol succinate (TOPROL-XL) 25 MG 24 hr tablet Take 12.5 mg by mouth daily. Hold if heart rate is less than 50 beats per minute.   Yes [provider]  Microlet Lancets MISC Use to check blood sugar as needed Patient taking differently: 1 each by Other route See  admin instructions. Use to check blood sugar as needed 04/28/21  Yes Wendall Stade, MD  Multiple Vitamin (MULTIVITAMIN) capsule Take 1 capsule by mouth daily.   Yes [provider]  QELBREE 200 MG 24 hr capsule Take 200 mg by mouth daily. 10/01/22  Yes [provider]  tamsulosin (FLOMAX) 0.4 MG CAPS capsule Take 0.4 mg by mouth at bedtime.    Yes [provider]  traZODone (DESYREL) 50 MG tablet Take 25 mg by mouth at bedtime. 1/2 tablet at bedtime 04/27/21  Yes [provider]      Allergies     Amphetamine-dextroamphet er    Review of Systems   Review of Systems  Cardiovascular:  Positive for chest pain.  All other systems reviewed and are negative.   Physical Exam Updated Vital Signs BP (!) 112/58 (BP Location: Right Arm)   Pulse (!) 54   Temp (!) 97.4 F (36.3 C) (Oral)   Resp 17   Ht 6' (1.829 m)   Wt 82 kg   SpO2 95%   BMI 24.52 kg/m  Physical Exam Vitals and nursing note reviewed.  Constitutional:      General: He is not in acute distress.    Appearance: Normal appearance.  HENT:     Head: Normocephalic and atraumatic.     Mouth/Throat:     Mouth: Mucous membranes are moist.  Eyes:     Conjunctiva/sclera: Conjunctivae normal.     Pupils: Pupils are equal, round, and reactive to light.  Cardiovascular:     Rate and Rhythm: Normal rate and regular rhythm.     Pulses: Normal pulses.     Heart sounds: Normal heart sounds.  Pulmonary:     Effort: Pulmonary effort is normal.     Breath sounds: Normal breath sounds.  Abdominal:     Palpations: Abdomen is soft.     Tenderness: There is no abdominal tenderness.  Skin:    General: Skin is warm and dry.     Findings: No rash.  Neurological:     General: No focal deficit present.     Mental Status: He is alert.     Sensory: No sensory deficit.     Motor: No weakness.  Psychiatric:        Mood and Affect: Mood normal.        Behavior: Behavior normal.     ED Results / Procedures / Treatments   Labs (all labs ordered are listed, but only abnormal results are displayed) Labs Reviewed  BASIC METABOLIC PANEL - Abnormal; Notable for the following components:      Result Value   Sodium 134 (*)    Glucose, Bld 103 (*)    Calcium 8.5 (*)    All other components within normal limits  CBC - Abnormal; Notable for the following components:   RBC 3.74 (*)    Hemoglobin 11.7 (*)    HCT 35.9 (*)    All other components within normal limits  TROPONIN I (HIGH SENSITIVITY)  TROPONIN I (HIGH SENSITIVITY)     EKG EKG Interpretation Date/Time:  Friday July 12 2023 15:23:47 EDT Ventricular Rate:  59 PR Interval:  155 QRS Duration:  96 QT Interval:  424 QTC Calculation: 420 R Axis:   35  Text Interpretation: Sinus rhythm Abnormal R-wave progression, early transition nonspecific ST changes Confirmed by Pricilla Loveless [phone]) on 07/12/2023 3:29:02 PM  Radiology DG Chest Portable 1 View  Result Date: 07/12/2023 CLINICAL DATA:  Chest pain.  EXAM: PORTABLE CHEST 1 VIEW COMPARISON:  February 07, 2020. FINDINGS: The heart size and mediastinal contours are within normal limits. Both lungs are clear. The visualized skeletal structures are unremarkable. IMPRESSION: No active disease. Electronically Signed   By: Lupita Raider M.D.   On: 07/12/2023 16:36    Procedures Procedures: not indicated.   Medications Ordered in ED Medications  lidocaine (LIDODERM) 5 % 1 patch (1 patch Transdermal Patch Applied 07/12/23 1945)  aspirin chewable tablet 324 mg (324 mg Oral Given 07/12/23 1557)  ibuprofen (ADVIL) tablet 800 mg (800 mg Oral Given 07/12/23 1836)  sodium chloride 0.9 % bolus 1,000 mL (0 mLs Intravenous Stopped 07/12/23 1940)  methocarbamol (ROBAXIN) tablet 500 mg (500 mg Oral Given 07/12/23 2014)    ED Course/ Medical Decision Making/ A&P                                 Medical Decision Making Amount and/or Complexity of Data Reviewed Labs: ordered. Radiology: ordered.  Risk OTC drugs. Prescription drug management.   This patient presents to the ED for concern of chest pain, this involves an extensive number of treatment options, and is a complaint that carries with it a high risk of complications and morbidity.   Differential diagnosis includes: ACS, pneumonia, pneumothorax, pleurisy, costochondritis, muscle strain, GERD, etc.   Co morbidities that complicate the patient evaluation  Rheumatoid arthritis Hyperlipidemia Osteoarthritis Hypothyroidism DDD   Additional  history  Additional history obtained from patient's records and EMS.   Cardiac Monitoring / EKG  The patient was maintained on a cardiac monitor.  I personally viewed and interpreted the cardiac monitored which showed: sinus rhythm with no ST changes with a heart rate of 59 bpm.   Lab Tests  I ordered and personally interpreted labs.  The pertinent results include:   Initial troponin of 6, repeat of 6 as well. BMP is within normal limits - no electrolyte imbalance or AKI CBC is within normal limits - no acute anemia or infection.    Imaging Studies  I ordered imaging studies including CXR  I independently visualized and interpreted imaging which showed: no active disease I agree with the radiologist interpretation   Problem List / ED Course / Critical interventions / Medication management  Chest pain I ordered medications including:  Aspirin for chest pain  Reevaluation of the patient after these medicines showed that the patient stayed the same We tried 800 mg of ibuprofen which helped some and then a lidocaine patch which brought his pain to a "4/10." He thinks his pain may have been due to RA since he is having pain throughout his body as well, he tells me on reevaluation. We then tried Robaxin, which seemed to help the most. I have reviewed the patients home medicines and have made adjustments as needed   Social Determinants of Health  Tobacco use   Test / Admission - Considered  Discussed results with patient and wife at bedside. All questions were answered. Patient is hemodynamically stable and safe for discharge home. Prescription for Robaxin sent to pharmacy. Return precautions given.        Final Clinical Impression(s) / ED Diagnoses Final diagnoses:  Atypical chest pain    Rx / DC Orders ED Discharge Orders          Ordered    methocarbamol (ROBAXIN) 500 MG tablet  2 times daily PRN  07/12/23 2038              Maxwell Marion,  PA-C 07/12/23 2044    Sloan Leiter, DO 07/13/23 2252

## 2023-07-12 NOTE — ED Triage Notes (Signed)
Pt coming in for sudden onset of chest pain a few hours ago, that he had thought was potentially SVT which he has a Hx of, so he attempted vagal maneuvers with no success. Medic was called out and found him not to be in SVT but the chest pain persisted. He was given .4 nitroglycerin SL which dropped his pressure to a 90sys pressure. Pt is otherwise stable, able to answer all questions, and is cooperative in triage process.

## 2023-07-15 DIAGNOSIS — F909 Attention-deficit hyperactivity disorder, unspecified type: Secondary | ICD-10-CM | POA: Diagnosis not present

## 2023-07-15 DIAGNOSIS — G4733 Obstructive sleep apnea (adult) (pediatric): Secondary | ICD-10-CM | POA: Diagnosis not present

## 2023-07-15 DIAGNOSIS — R413 Other amnesia: Secondary | ICD-10-CM | POA: Diagnosis not present

## 2023-07-15 DIAGNOSIS — G4710 Hypersomnia, unspecified: Secondary | ICD-10-CM | POA: Diagnosis not present

## 2023-07-19 ENCOUNTER — Ambulatory Visit (HOSPITAL_COMMUNITY)
Admission: EM | Disposition: A | Discharge status: Home / Self Care | Payer: Self-pay | Source: Home / Self Care | Attending: Emergency Medicine

## 2023-07-19 ENCOUNTER — Emergency Department (HOSPITAL_COMMUNITY): Payer: PPO

## 2023-07-19 ENCOUNTER — Encounter: Payer: Self-pay | Admitting: Nurse Practitioner

## 2023-07-19 ENCOUNTER — Ambulatory Visit (HOSPITAL_COMMUNITY): Admit: 2023-07-19 | Payer: PPO | Admitting: Cardiovascular Disease

## 2023-07-19 ENCOUNTER — Observation Stay (HOSPITAL_COMMUNITY)
Admission: EM | Admit: 2023-07-19 | Discharge: 2023-07-20 | Disposition: A | Discharge status: Home / Self Care | Payer: PPO | Attending: Cardiology | Admitting: Cardiology

## 2023-07-19 ENCOUNTER — Encounter (HOSPITAL_COMMUNITY): Payer: Self-pay

## 2023-07-19 ENCOUNTER — Ambulatory Visit: Payer: PPO | Admitting: Nurse Practitioner

## 2023-07-19 ENCOUNTER — Other Ambulatory Visit: Payer: Self-pay

## 2023-07-19 VITALS — BP 100/62 | HR 63 | Ht 72.0 in | Wt 177.4 lb

## 2023-07-19 DIAGNOSIS — R531 Weakness: Secondary | ICD-10-CM | POA: Diagnosis not present

## 2023-07-19 DIAGNOSIS — E785 Hyperlipidemia, unspecified: Secondary | ICD-10-CM | POA: Diagnosis present

## 2023-07-19 DIAGNOSIS — R072 Precordial pain: Principal | ICD-10-CM | POA: Diagnosis present

## 2023-07-19 DIAGNOSIS — R0602 Shortness of breath: Secondary | ICD-10-CM | POA: Diagnosis not present

## 2023-07-19 DIAGNOSIS — R0789 Other chest pain: Secondary | ICD-10-CM | POA: Diagnosis not present

## 2023-07-19 DIAGNOSIS — R079 Chest pain, unspecified: Secondary | ICD-10-CM | POA: Diagnosis not present

## 2023-07-19 DIAGNOSIS — I959 Hypotension, unspecified: Secondary | ICD-10-CM | POA: Diagnosis not present

## 2023-07-19 DIAGNOSIS — E039 Hypothyroidism, unspecified: Secondary | ICD-10-CM | POA: Diagnosis not present

## 2023-07-19 DIAGNOSIS — I471 Supraventricular tachycardia, unspecified: Secondary | ICD-10-CM | POA: Insufficient documentation

## 2023-07-19 HISTORY — PX: LEFT HEART CATH AND CORONARY ANGIOGRAPHY: CATH118249

## 2023-07-19 LAB — CBC
HCT: 36.2 % — ABNORMAL LOW (ref 39.0–52.0)
HCT: 36.4 % — ABNORMAL LOW (ref 39.0–52.0)
Hemoglobin: 12.3 g/dL — ABNORMAL LOW (ref 13.0–17.0)
Hemoglobin: 12.1 g/dL — ABNORMAL LOW (ref 13.0–17.0)
MCH: 32.4 pg (ref 26.0–34.0)
MCH: 31 pg (ref 26.0–34.0)
MCHC: 34 g/dL (ref 30.0–36.0)
MCHC: 33.2 g/dL (ref 30.0–36.0)
MCV: 95.3 fL (ref 80.0–100.0)
MCV: 93.3 fL (ref 80.0–100.0)
Platelets: 287 10*3/uL (ref 150–400)
Platelets: 269 10*3/uL (ref 150–400)
RBC: 3.8 MIL/uL — ABNORMAL LOW (ref 4.22–5.81)
RBC: 3.9 MIL/uL — ABNORMAL LOW (ref 4.22–5.81)
RDW: 12.6 % (ref 11.5–15.5)
RDW: 12.5 % (ref 11.5–15.5)
WBC: 9.8 10*3/uL (ref 4.0–10.5)
WBC: 9.2 10*3/uL (ref 4.0–10.5)
nRBC: 0 % (ref 0.0–0.2)
nRBC: 0 % (ref 0.0–0.2)

## 2023-07-19 LAB — BASIC METABOLIC PANEL
Anion gap: 13 (ref 5–15)
BUN: 17 mg/dL (ref 8–23)
CO2: 24 mmol/L (ref 22–32)
Calcium: 9.1 mg/dL (ref 8.9–10.3)
Chloride: 98 mmol/L (ref 98–111)
Creatinine, Ser: 1.07 mg/dL (ref 0.61–1.24)
GFR, Estimated: >60 mL/min (ref >60)
Glucose, Bld: 120 mg/dL — ABNORMAL HIGH (ref 70–99)
Potassium: 4.4 mmol/L (ref 3.5–5.1)
Sodium: 135 mmol/L (ref 135–145)

## 2023-07-19 LAB — TROPONIN I (HIGH SENSITIVITY)
Troponin I (High Sensitivity): 20 ng/L — ABNORMAL HIGH (ref <18)
Troponin I (High Sensitivity): 19 ng/L — ABNORMAL HIGH (ref <18)

## 2023-07-19 LAB — CREATININE, SERUM
Creatinine, Ser: 1.05 mg/dL (ref 0.61–1.24)
GFR, Estimated: >60 mL/min (ref >60)

## 2023-07-19 SURGERY — LEFT HEART CATH AND CORONARY ANGIOGRAPHY
Anesthesia: LOCAL

## 2023-07-19 MED ORDER — SODIUM CHLORIDE 0.9 % IV SOLN: 250.0000 mL | INTRAVENOUS | Status: DC | PRN

## 2023-07-19 MED ORDER — SODIUM CHLORIDE 0.9 % WEIGHT BASED INFUSION
1.0000 mL/kg/h | INTRAVENOUS | Status: AC
  Administered 2023-07-19: 1 mL/kg/h via INTRAVENOUS

## 2023-07-19 MED ORDER — MIDAZOLAM HCL 2 MG/2ML IJ SOLN
INTRAMUSCULAR | Status: AC
  Filled 2023-07-19: qty 2

## 2023-07-19 MED ORDER — FENTANYL CITRATE (PF) 100 MCG/2ML IJ SOLN
INTRAMUSCULAR | Status: AC
  Filled 2023-07-19: qty 2

## 2023-07-19 MED ORDER — FENTANYL CITRATE (PF) 100 MCG/2ML IJ SOLN
INTRAMUSCULAR | Status: DC | PRN
  Administered 2023-07-19: 25 ug via INTRAVENOUS

## 2023-07-19 MED ORDER — TAMSULOSIN HCL 0.4 MG PO CAPS: 0.4000 mg | ORAL_CAPSULE | Freq: Every day | ORAL | Status: DC

## 2023-07-19 MED ORDER — VERAPAMIL HCL 2.5 MG/ML IV SOLN: INTRAVENOUS | Status: DC | PRN

## 2023-07-19 MED ORDER — ACETAMINOPHEN 325 MG PO TABS
650.0000 mg | ORAL_TABLET | ORAL | Status: DC | PRN
  Administered 2023-07-19 – 2023-07-20 (×2): 650 mg via ORAL
  Filled 2023-07-19: qty 2

## 2023-07-19 MED ORDER — SODIUM CHLORIDE 0.9 % IV SOLN: INTRAVENOUS | Status: DC

## 2023-07-19 MED ORDER — HEPARIN SODIUM (PORCINE) 1000 UNIT/ML IJ SOLN
INTRAMUSCULAR | Status: AC
  Filled 2023-07-19: qty 10

## 2023-07-19 MED ORDER — GABAPENTIN 300 MG PO CAPS: 300.0000 mg | ORAL_CAPSULE | Freq: Two times a day (BID) | ORAL | Status: DC

## 2023-07-19 MED ORDER — LEVOTHYROXINE SODIUM 88 MCG PO TABS
88.0000 ug | ORAL_TABLET | Freq: Every morning | ORAL | Status: DC
  Administered 2023-07-20: 88 ug via ORAL
  Filled 2023-07-19: qty 1

## 2023-07-19 MED ORDER — METFORMIN HCL 500 MG PO TABS
500.0000 mg | ORAL_TABLET | Freq: Every day | ORAL | Status: DC
  Administered 2023-07-20: 500 mg via ORAL
  Filled 2023-07-19: qty 1

## 2023-07-19 MED ORDER — MULTIVITAMINS PO CAPS: 1.0000 | ORAL_CAPSULE | Freq: Every day | ORAL | Status: DC

## 2023-07-19 MED ORDER — DULOXETINE HCL 60 MG PO CPEP: 60.0000 mg | ORAL_CAPSULE | Freq: Every day | ORAL | Status: DC

## 2023-07-19 MED ORDER — MIDAZOLAM HCL 2 MG/2ML IJ SOLN
INTRAMUSCULAR | Status: DC | PRN
  Administered 2023-07-19: 1 mg via INTRAVENOUS

## 2023-07-19 MED ORDER — HEPARIN (PORCINE) IN NACL 1000-0.9 UT/500ML-% IV SOLN
INTRAVENOUS | Status: DC | PRN
  Administered 2023-07-19: 1000 mL

## 2023-07-19 MED ORDER — BUPROPION HCL ER (XL) 150 MG PO TB24: 150.0000 mg | ORAL_TABLET | Freq: Every morning | ORAL | Status: DC

## 2023-07-19 MED ORDER — NITROGLYCERIN 0.4 MG SL SUBL: 0.4000 mg | SUBLINGUAL_TABLET | SUBLINGUAL | Status: DC | PRN

## 2023-07-19 MED ORDER — ASPIRIN 81 MG PO CHEW: 81.0000 mg | CHEWABLE_TABLET | ORAL | Status: DC

## 2023-07-19 MED ORDER — ONDANSETRON HCL 4 MG/2ML IJ SOLN: 4.0000 mg | Freq: Four times a day (QID) | INTRAMUSCULAR | Status: DC | PRN

## 2023-07-19 MED ORDER — LIDOCAINE HCL (PF) 1 % IJ SOLN
INTRAMUSCULAR | Status: DC | PRN
  Administered 2023-07-19: 15 mL via INTRADERMAL

## 2023-07-19 MED ORDER — VERAPAMIL HCL 2.5 MG/ML IV SOLN
INTRAVENOUS | Status: AC
  Filled 2023-07-19: qty 2

## 2023-07-19 MED ORDER — LIDOCAINE HCL (PF) 1 % IJ SOLN
INTRAMUSCULAR | Status: AC
  Filled 2023-07-19: qty 30

## 2023-07-19 MED ORDER — ADULT MULTIVITAMIN W/MINERALS CH
1.0000 | ORAL_TABLET | Freq: Every day | ORAL | Status: DC
  Administered 2023-07-20: 1 via ORAL
  Filled 2023-07-19: qty 1

## 2023-07-19 MED ORDER — IOHEXOL 350 MG/ML SOLN
INTRAVENOUS | Status: DC | PRN
  Administered 2023-07-19: 70 mL

## 2023-07-19 MED ORDER — ACETAMINOPHEN 325 MG PO TABS: 650.0000 mg | ORAL_TABLET | ORAL | Status: DC | PRN

## 2023-07-19 MED ORDER — ENOXAPARIN SODIUM 40 MG/0.4ML IJ SOSY
40.0000 mg | PREFILLED_SYRINGE | INTRAMUSCULAR | Status: DC
  Administered 2023-07-20: 40 mg via SUBCUTANEOUS
  Filled 2023-07-19: qty 0.4

## 2023-07-19 MED ORDER — ARMODAFINIL 150 MG PO TABS: 150.0000 mg | ORAL_TABLET | Freq: Every morning | ORAL | Status: DC

## 2023-07-19 MED ORDER — SODIUM CHLORIDE 0.9% FLUSH: 3.0000 mL | INTRAVENOUS | Status: DC | PRN

## 2023-07-19 MED ORDER — ACETAMINOPHEN 325 MG PO TABS
ORAL_TABLET | ORAL | Status: AC
  Filled 2023-07-19: qty 2

## 2023-07-19 MED ORDER — IRON 325 (65 FE) MG PO TABS: ORAL_TABLET | Freq: Every day | ORAL | Status: DC

## 2023-07-19 MED ORDER — SODIUM CHLORIDE 0.9% FLUSH: 3.0000 mL | Freq: Two times a day (BID) | INTRAVENOUS | Status: DC

## 2023-07-19 MED ORDER — TRAZODONE 25 MG HALF TABLET
25.0000 mg | ORAL_TABLET | Freq: Every day | ORAL | Status: DC
  Filled 2023-07-19 (×2): qty 1

## 2023-07-19 MED ORDER — VILOXAZINE HCL ER 200 MG PO CP24: 200.0000 mg | ORAL_CAPSULE | Freq: Every day | ORAL | Status: DC

## 2023-07-19 MED ORDER — METHOCARBAMOL 500 MG PO TABS: 500.0000 mg | ORAL_TABLET | Freq: Two times a day (BID) | ORAL | Status: DC | PRN

## 2023-07-19 SURGICAL SUPPLY — 14 items
CATH 5FR JL3.5 JR4 ANG PIG MP (CATHETERS) IMPLANT
CATH INFINITI 5FR JL4 (CATHETERS) IMPLANT
CLOSURE MYNX CONTROL 5F (Vascular Products) IMPLANT
DEVICE RAD COMP TR BAND LRG (VASCULAR PRODUCTS) IMPLANT
GLIDESHEATH SLEND SS 6F .021 (SHEATH) IMPLANT
GUIDEWIRE INQWIRE 1.5J.035X260 (WIRE) IMPLANT
INQWIRE 1.5J .035X260CM (WIRE) ×1
KIT MICROPUNCTURE NIT STIFF (SHEATH) IMPLANT
PACK CARDIAC CATHETERIZATION (CUSTOM PROCEDURE TRAY) ×2 IMPLANT
SET ATX-X65L (MISCELLANEOUS) IMPLANT
SHEATH PINNACLE 5F 10CM (SHEATH) IMPLANT
TUBING CIL FLEX 10 FLL-RA (TUBING) ×2 IMPLANT
WIRE EMERALD 3MM-J .035X150CM (WIRE) IMPLANT
WIRE HI TORQ VERSACORE-J 145CM (WIRE) IMPLANT

## 2023-07-19 NOTE — ED Triage Notes (Signed)
Pt BIB [facility] from heart care for c/o central chest pain with radiation to neck and shoulder, sudden onset at 0200 this morning at rest. Went to heart care this morning, sent here for concerning EKG. 324 aspirin given PTA.  100/76

## 2023-07-19 NOTE — H&P (Addendum)
Cardiology Office Note:  .   Date:  07/19/2023  ID:  Paul [name], DOB 1956/05/19, MRN [id number] PCP: Georgann Housekeeper, MD  [facility] Providers Cardiologist:  Charlton Haws, MD Electrophysiologist:  Hillis Range, MD (Inactive)    Patient Profile: .      Past medical history: Hyperlipidemia SVT Hypothyroidism Rheumatoid arthritis Chest pain Family history early CAD Father had CABG mid 86s Elevated coronary calcium score CT Cardiac Score 04/24/21 >> CAC 191 (73rd percentile) CT Cardiac Score 08/07/18 >> CAC 133 (72nd percentile) CT Cardiac Score 07/11/16 >> CAC 109 (68th percentile) Low risk ETT 07/26/2016 Anxiety  Depression  Referred to cardiology for chest pain and seen by Dr. Eden Emms in 2017. Had elevated coronary artery calcium score and subsequent low risk ETT in 2017.  He had memory issues and has been followed by neurology.  Works as a Nutritional therapist.  Had elevated CPK and Zetia was discontinued.  Cardiac monitor showed SVT and he was referred to EP cardiology.   Last cardiology clinic visit was 05/01/2023 with Dr. Elberta Fortis for evaluation of SVT.  He reported increased episodes of SVT.  Vagal maneuvers were discussed.  Ablation was discussed as an option, however he would like to avoid this for now.  Diltiazem 30 mg as needed was added to daily metoprolol for symptom relief.       History of Present Illness: Paul [name] is a pleasant 68 y.o. male who is here today with his wife for urgent work-in for chest pain. Was seen earlier today at [facility] and referred to [place]. He reports he was awakened with severe chest pain this morning at 0200. Felt like someone was kneeling on his chest. Was sleeping with his CPAP on and thought his HR was speeding up. Could not take a deep breath. Pain in right shoulder and neck increased. He had a difficult time getting comfortable. Got up and checked his HR which was 39 bpm, O2 sat was 89%. Pain has improved from rated 9/10 to now 1/10.  Similar symptoms occurred last week but not quite as severe. He went to ER, had no acute EKG changes and normal troponins. Pain that occurred one week ago described as not as severe in mid-sternal region.  Initial symptoms on 8/9 were ight sided neck and shoulder pain. It was difficult at that time to take deep breaths as well.  Had severe chest pain last week, it was difficult to take a deep breath. He could only take short breaths. At the same time his right neck, shoulder and arm got tight with shooting pain in shoulder - went to ER on 8/9, discharged with ibuprofen and muscle relaxer which improved symptoms somewhat but not completely. This week he has felt more fatigued and lightheadeded. Palpitations occur intermittently, generally last about 2 minutes, last occurrence about one week ago  ROS: See HPI       Studies Reviewed: Marland Kitchen   EKG Interpretation Date/Time:  Friday July 19 2023 14:03:27 EDT Ventricular Rate:  65 PR Interval:  158 QRS Duration:  82 QT Interval:  392 QTC Calculation: 407 R Axis:   25  Text Interpretation: Normal sinus rhythm Normal ECG When compared with ECG of 19-Jul-2023 13:47, Non-specific change in ST segment in Inferior leads T wave inversion no longer evident in Inferior leads Nonspecific T wave abnormality no longer evident in Lateral leads Confirmed by Eligha Bridegroom [phone]) on 07/19/2023 2:06:11 PM     Risk Assessment/Calculations:  Physical Exam:   VS:  BP 100/62   Pulse 63   Ht 6' (1.829 m)   Wt 177 lb 6.4 oz (80.5 kg)   SpO2 98%   BMI 24.06 kg/m    Wt Readings from Last 3 Encounters:  07/19/23 177 lb 6.4 oz (80.5 kg)  07/12/23 180 lb 12.4 oz (82 kg)  05/13/23 179 lb (81.2 kg)    GEN: Well nourished, well developed in no acute distress NECK: No JVD; No carotid bruits CARDIAC: RRR, no murmurs, rubs, gallops RESPIRATORY:  Clear to auscultation without rales, wheezing or rhonchi  ABDOMEN: Soft, non-tender,  non-distended EXTREMITIES:  No edema; No deformity     ASSESSMENT AND PLAN: .    Chest pain: Concerning for unstable angina with ST changes noted on EKG. Reviewed with Dr. Lynnette Caffey, DOD, and plan to transport to ED via EMS for emergent coronary angiography.  SVT: Occasional palpitations. Continue BB and prn CCB.  Hyperlipidemia LDL goal < 70: LDL 41 on 12/06/22.        Dispo: TBD following hospital evaluation  Signed, Eligha Bridegroom, NP-C  ATTENDING ATTESTATION:  After conducting a review of all available clinical information with the care team, interviewing the patient, and performing a physical exam, I agree with the findings and plan described in this note.   GEN: Anxious  HEENT:  MMM, no JVD, no scleral icterus Cardiac: RRR, no murmurs, rubs, or gallops.  Respiratory: Clear to auscultation bilaterally. GI: Soft, nontender, non-distended  MS: No edema; No deformity. Neuro:  Nonfocal  Vasc:  +2 radial pulses  Patient seen for routine follow-up.  He had intermittent chest pain and had a negative evaluation emergency room apartment recently.  EKG done today is concerning for a lateral ST elevation myocardial infarction.  Given ongoing chest pain and concerning EKG findings will refer for emergent coronary angiography.  Alverda Skeans, MD Pager [phone]
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
## 2023-07-19 NOTE — ED Notes (Signed)
Patient transported to X-ray 

## 2023-07-19 NOTE — Interval H&P Note (Signed)
History and Physical Interval Note:  07/19/2023 4:17 PM  Paul [name]  has presented today for surgery, with the diagnosis of chest pain.  The various methods of treatment have been discussed with the patient and family. After consideration of risks, benefits and other options for treatment, the patient has consented to  Procedure(s): LEFT HEART CATH AND CORONARY ANGIOGRAPHY (N/A) as a surgical intervention.  The patient's history has been reviewed, patient examined, no change in status, stable for surgery.  I have reviewed the patient's chart and labs.  Questions were answered to the patient's satisfaction.   Cath Lab Visit (complete for each Cath Lab visit)  Clinical Evaluation Leading to the Procedure:   ACS: Yes.    Non-ACS:    Anginal Classification: CCS IV  Anti-ischemic medical therapy: Minimal Therapy (1 class of medications)  Non-Invasive Test Results: No non-invasive testing performed  Prior CABG: No previous CABG        Theron Arista [facility] 07/19/2023 4:17 PM

## 2023-07-19 NOTE — Progress Notes (Signed)
Cardiology Office Note:  .   Date:  07/19/2023  ID:  CHASEN ERTL, DOB 04/05/1956, MRN [id number] PCP: Georgann Housekeeper, MD  [facility] Providers Cardiologist:  Charlton Haws, MD Electrophysiologist:  Hillis Range, MD (Inactive)    Patient Profile: .      Past medical history: Hyperlipidemia SVT Hypothyroidism Rheumatoid arthritis Chest pain Family history early CAD Father had CABG mid 68s Elevated coronary calcium score CT Cardiac Score 04/24/21 >> CAC 191 (73rd percentile) CT Cardiac Score 08/07/18 >> CAC 133 (72nd percentile) CT Cardiac Score 07/11/16 >> CAC 109 (68th percentile) Low risk ETT 07/26/2016 Anxiety  Depression  Referred to cardiology for chest pain and seen by Dr. Eden Emms in 2017. Had elevated coronary artery calcium score and subsequent low risk ETT in 2017.  He had memory issues and has been followed by neurology.  Works as a Nutritional therapist.  Had elevated CPK and Zetia was discontinued.  Cardiac monitor showed SVT and he was referred to EP cardiology.   Last cardiology clinic visit was 05/01/2023 with Dr. Elberta Fortis for evaluation of SVT.  He reported increased episodes of SVT.  Vagal maneuvers were discussed.  Ablation was discussed as an option, however he would like to avoid this for now.  Diltiazem 30 mg as needed was added to daily metoprolol for symptom relief.       History of Present Illness: Davis Gourd is a pleasant 67 y.o. male who is here today with his wife for urgent work-in for chest pain. Was seen earlier today at [facility] and referred to [place]. He reports he was awakened with severe chest pain this morning at 0200. Felt like someone was kneeling on his chest. Was sleeping with his CPAP on and thought his HR was speeding up. Could not take a deep breath. Pain in right shoulder and neck increased. He had a difficult time getting comfortable. Got up and checked his HR which was 39 bpm, O2 sat was 89%. Pain has improved from rated 9/10 to now 1/10.  Similar symptoms occurred last week but not quite as severe. He went to ER, had no acute EKG changes and normal troponins. Pain that occurred one week ago described as not as severe in mid-sternal region.  Initial symptoms on 8/9 were ight sided neck and shoulder pain. It was difficult at that time to take deep breaths as well.  Had severe chest pain last week, it was difficult to take a deep breath. He could only take short breaths. At the same time his right neck, shoulder and arm got tight with shooting pain in shoulder - went to ER on 8/9, discharged with ibuprofen and muscle relaxer which improved symptoms somewhat but not completely. This week he has felt more fatigued and lightheadeded. Palpitations occur intermittently, generally last about 2 minutes, last occurrence about one week ago  ROS: See HPI       Studies Reviewed: Marland Kitchen   EKG Interpretation Date/Time:  Friday July 19 2023 14:03:27 EDT Ventricular Rate:  65 PR Interval:  158 QRS Duration:  82 QT Interval:  392 QTC Calculation: 407 R Axis:   25  Text Interpretation: Normal sinus rhythm Normal ECG When compared with ECG of 19-Jul-2023 13:47, Non-specific change in ST segment in Inferior leads T wave inversion no longer evident in Inferior leads Nonspecific T wave abnormality no longer evident in Lateral leads Confirmed by Eligha Bridegroom [phone]) on 07/19/2023 2:06:11 PM     Risk Assessment/Calculations:  Physical Exam:   VS:  BP 100/62   Pulse 63   Ht 6' (1.829 m)   Wt 177 lb 6.4 oz (80.5 kg)   SpO2 98%   BMI 24.06 kg/m    Wt Readings from Last 3 Encounters:  07/19/23 177 lb 6.4 oz (80.5 kg)  07/12/23 180 lb 12.4 oz (82 kg)  05/13/23 179 lb (81.2 kg)    GEN: Well nourished, well developed in no acute distress NECK: No JVD; No carotid bruits CARDIAC: RRR, no murmurs, rubs, gallops RESPIRATORY:  Clear to auscultation without rales, wheezing or rhonchi  ABDOMEN: Soft, non-tender,  non-distended EXTREMITIES:  No edema; No deformity     ASSESSMENT AND PLAN: .    Chest pain: Concerning for unstable angina with ST changes noted on EKG. Reviewed with Dr. Lynnette Caffey, DOD, and plan to transport to ED via EMS for possible cardiac cath today.  SVT: Occasional palpitations. Continue BB and prn CCB.  Hyperlipidemia LDL goal < 70: LDL 41 on 12/06/22.        Dispo: TBD following hospital evaluation  Signed, Eligha Bridegroom, NP-C

## 2023-07-20 ENCOUNTER — Observation Stay (HOSPITAL_BASED_OUTPATIENT_CLINIC_OR_DEPARTMENT_OTHER): Payer: PPO

## 2023-07-20 DIAGNOSIS — R072 Precordial pain: Secondary | ICD-10-CM | POA: Diagnosis not present

## 2023-07-20 DIAGNOSIS — I471 Supraventricular tachycardia, unspecified: Secondary | ICD-10-CM | POA: Insufficient documentation

## 2023-07-20 DIAGNOSIS — R079 Chest pain, unspecified: Secondary | ICD-10-CM

## 2023-07-20 DIAGNOSIS — E039 Hypothyroidism, unspecified: Secondary | ICD-10-CM | POA: Diagnosis not present

## 2023-07-20 LAB — ECHOCARDIOGRAM COMPLETE
AR max vel: 3.44 cm2
AV Area VTI: 3.4 cm2
AV Area mean vel: 3.24 cm2
AV Mean grad: 3 mmHg
AV Peak grad: 6.5 mmHg
Ao pk vel: 1.27 m/s
Area-P 1/2: 3.16 cm2
Height: 72 in
S' Lateral: 2.8 cm
Weight: 2780.8 [oz_av]

## 2023-07-20 MED ORDER — FERROUS SULFATE 325 (65 FE) MG PO TABS: 325.0000 mg | ORAL_TABLET | Freq: Every day | ORAL | Status: DC

## 2023-07-20 MED ORDER — PANTOPRAZOLE SODIUM 40 MG PO TBEC: 40.0000 mg | DELAYED_RELEASE_TABLET | Freq: Every day | ORAL | 1 refills | Status: DC

## 2023-07-20 NOTE — Discharge Summary (Signed)
Discharge Summary    Patient ID: Paul [name] MRN: [id number]; DOB: 1956-04-24  Admit date: 07/19/2023 Discharge date: 07/20/2023  PCP:  Georgann Housekeeper, MD   [facility] Providers Cardiologist:  Charlton Haws, MD  Electrophysiologist:  Hillis Range, MD (Inactive)     Discharge Diagnoses    Principal Problem:   Chest pain, precordial Active Problems:   Hyperlipidemia   Chest pain   Paroxysmal SVT (supraventricular tachycardia)    Diagnostic Studies/Procedures   TTE 07/20/2023  1. Left ventricular ejection fraction, by estimation, is 60 to 65%. The  left ventricle has normal function. The left ventricle has no regional  wall motion abnormalities. Left ventricular diastolic parameters are  consistent with Grade I diastolic  dysfunction (impaired relaxation).   2. Right ventricular systolic function is normal. The right ventricular  size is normal. Tricuspid regurgitation signal is inadequate for assessing  PA pressure.   3. The mitral valve is grossly normal. Trivial mitral valve  regurgitation. No evidence of mitral stenosis.   4. The aortic valve is tricuspid. Aortic valve regurgitation is not  visualized. No aortic stenosis is present.   5. The inferior vena cava is normal in size with greater than 50%  respiratory variability, suggesting right atrial pressure of 3 mmHg.    Left Heart Catheterization 07/19/23   Prox LAD lesion is 5% stenosed.   The left ventricular systolic function is normal.   LV end diastolic pressure is normal.   The left ventricular ejection fraction is 55-65% by visual estimate.   No significant CAD. Mild calcification in the proximal LAD Normal LV function Normal LVEDP   Plan: consider other causes of chest pain  Diagnostic Dominance: Right  _____________   History of Present Illness     Paul [name] is a 67 y.o. male with a past medical history of hyperlipidemia, SVT, hypothyroidism, rheumatoid arthritis, chest  pain, family history of early CAD, elevated coronary calcium score.  Per chart review, patient was referred to cardiology in 2017 for evaluation of chest pain and was seen by Dr. Cyndie Chime.  Had a elevated coronary artery calcium score and subsequent low risk exercise tolerance test in 2017.  Later, a cardiac monitor showed SVT and he was referred to EP cardiology.  He was last seen by cardiology on 05/01/2023 by Dr. Elberta Fortis for evaluation of SVT.  Patient reported increased episodes of SVT.  Vagal maneuvers were discussed.  Ablation was discussed as an option, but patient reported that he would like to avoid for now.  He was started on diltiazem 30 mg as needed.  Patient was seen in clinic on 8/16 for evaluation of chest pain.  Patient reported that he had woken up that morning with severe chest pain at around 2 AM.  Felt like someone was kneeling on his chest.  Felt like he could not take a deep breath and thought that his heart rate was speeding up.  Also had pain in her right shoulder and neck.  He got up and checked his heart rate which was 39 bpm, O2 saturation was 89%.  Pain was initially rated 9 out of 10 when it first started but had improved to a 1 out of 10 by the time he was seen in clinic later that day.  In clinic, he reported that he did have similar symptoms that occurred a week prior, but they were not as severe.  He had gone to the ER at that time and had no acute  ED changes and normal troponins.  In office, EKG showed mild ST elevation in lead I, aVL and inverted T waves in leads III, aVF.  Case was discussed with DOD and patient was ultimately taken to the ED via EMS.  Given symptoms concerning for unstable angina and ST changes on EKG, recommended cardiac catheterization that day.  Hospital Course     Patient was brought into the ED via EMS on 8/16.  In the ED, high-sensitivity troponin 20, 19.  Chest x-ray showed no active cardiopulmonary disease.  Patient was taken to the Cath Lab with Dr.  [place] and was found to have no significant CAD.  There was only mild calcification in the proximal LAD.  The LVEF was 55-66% on visual estimate. He was admitted overnight for observation and underwent echocardiogram on 8/`7/14 that showed normal LV function.  Chest pain EKG changes -Patient was seen in clinic on 8/16 for evaluation of chest pain.  His description of chest pain was concerning for unstable angina.  EKG in clinic did show some ST changes.  He was brought to [facility] for cardiac catheterization later that day -Cardiac catheterization on 8/16 showed no significant CAD.  EF estimated at 55-65% on visual estimate.  Recommended consideration of other causes of chest pain - Echocardiogram this admission showed normal LV function. -We discussed with the patient and his wife that his symptoms are noncardiac.  They could just be related to acid reflux.  He describes vague symptoms which do not sound cardiac to me.  We will discharge with a trial of proton pump inhibitor.  Elevated Coronary Calcium Score  - Calcium scor was 191 (73rd percentile) in 04/2021 - As above, cath this admission showed no significant CAD  - Continue Repatha, Asprin  Paroxysmal SVT -The patient had brief asymptomatic SVT this admission. No symptoms.  -Patient was seen by Dr. Elberta Fortis in 04/2023 for discussion of ablation.  At that time, patient preferred to avoid ablation if possible -Continue metoprolol succinate 12.5 mg daily and Cardizem 30 mg as needed for elevated heart rate  Hyperlipidemia with LDL goal <70 - Lipid panel in 12/2022 showed LDL 41 - Continue Repatha  Patient was seen and examined by Dr. Flora Lipps and was deemed stable for discharge. Has a follow up appointment with cardiology on 9/10     Did the patient have an acute coronary syndrome (MI, NSTEMI, STEMI, etc) this admission?:  No                               Did the patient have a percutaneous coronary intervention (stent /  angioplasty)?:  No.      _____________  Discharge Exam:  Filed Weights   07/19/23 1944  Weight: 78.8 kg   Vitals:   07/19/23 2337 07/20/23 0506 07/20/23 0952 07/20/23 1303  BP: 100/64 117/62 132/74 124/78  Pulse: 70 61 (!) 54 (!) 53  Resp:  18 18 18   Temp:  97.8 F (36.6 C) 98.7 F (37.1 C) 98.1 F (36.7 C)  TempSrc:  Oral Oral Oral  SpO2: 96% 97% 96% 100%  Weight:      Height:        Intake/Output Summary (Last 24 hours) at 07/20/2023 1600 Last data filed at 07/20/2023 0300 Gross per 24 hour  Intake 720 ml  Output 750 ml  Net -30 ml       07/19/2023    7:44 PM 07/19/2023  1:29 PM 07/12/2023    7:42 PM  Last 3 Weights  Weight (lbs) 173 lb 12.8 oz 177 lb 6.4 oz 180 lb 12.4 oz  Weight (kg) 78.835 kg 80.468 kg 82 kg    Body mass index is 23.57 kg/m.  General: Well nourished, well developed, in no acute distress Head: Atraumatic, normal size  Eyes: PEERLA, EOMI  Neck: Supple, no JVD Endocrine: No thryomegaly Cardiac: Normal S1, S2; RRR; no murmurs, rubs, or gallops Lungs: Clear to auscultation bilaterally, no wheezing, rhonchi or rales  Abd: Soft, nontender, no hepatomegaly  Ext: No edema, pulses 2+, right radial cath site 2+ pulse Musculoskeletal: No deformities, BUE and BLE strength normal and equal Skin: Warm and dry, no rashes   Neuro: Alert and oriented to person, place, time, and situation, CNII-XII grossly intact, no focal deficits  Psych: Normal mood and affect    Labs & Radiologic Studies    CBC Recent Labs    07/19/23 1455 07/19/23 1954  WBC 9.8 9.2  HGB 12.3* 12.1*  HCT 36.2* 36.4*  MCV 95.3 93.3  PLT 287 269   Basic Metabolic Panel Recent Labs    11/91/47 1455 07/19/23 1954  NA 135  --   K 4.4  --   CL 98  --   CO2 24  --   GLUCOSE 120*  --   BUN 17  --   CREATININE 1.07 1.05  CALCIUM 9.1  --    Liver Function Tests No results for input(s): "AST", "ALT", "ALKPHOS", "BILITOT", "PROT", "ALBUMIN" in the last 72 hours. No results  for input(s): "LIPASE", "AMYLASE" in the last 72 hours. High Sensitivity Troponin:   Recent Labs  Lab 07/12/23 1540 07/12/23 1835 07/19/23 1455 07/19/23 1954  TROPONINIHS 6 6 20* 19*    BNP Invalid input(s): "POCBNP" D-Dimer No results for input(s): "DDIMER" in the last 72 hours. Hemoglobin A1C No results for input(s): "HGBA1C" in the last 72 hours. Fasting Lipid Panel No results for input(s): "CHOL", "HDL", "LDLCALC", "TRIG", "CHOLHDL", "LDLDIRECT" in the last 72 hours. Thyroid Function Tests No results for input(s): "TSH", "T4TOTAL", "T3FREE", "THYROIDAB" in the last 72 hours.  Invalid input(s): "FREET3" _____________  ECHOCARDIOGRAM COMPLETE  Result Date: 07/20/2023    ECHOCARDIOGRAM REPORT   Patient Name:   Paul [name] Date of Exam: 07/20/2023 Medical Rec #:  [id number]          Height:       72.0 in Accession #:    [id number]         Weight:       173.8 lb Date of Birth:  1956/05/12          BSA:          2.007 m Patient Age:    67 years           BP:           117/62 mmHg Patient Gender: M                  HR:           56 bpm. Exam Location:  Inpatient Procedure: 2D Echo, Color Doppler and Cardiac Doppler Indications:    Chest Pain  History:        Patient has no prior history of Echocardiogram examinations.                 Signs/Symptoms:Chest Pain; Risk Factors:Dyslipidemia.  Sonographer:    Milbert Coulter Referring Phys: [id number] CALLIE E  GOODRICH IMPRESSIONS  1. Left ventricular ejection fraction, by estimation, is 60 to 65%. The left ventricle has normal function. The left ventricle has no regional wall motion abnormalities. Left ventricular diastolic parameters are consistent with Grade I diastolic dysfunction (impaired relaxation).  2. Right ventricular systolic function is normal. The right ventricular size is normal. Tricuspid regurgitation signal is inadequate for assessing PA pressure.  3. The mitral valve is grossly normal. Trivial mitral valve regurgitation. No evidence  of mitral stenosis.  4. The aortic valve is tricuspid. Aortic valve regurgitation is not visualized. No aortic stenosis is present.  5. The inferior vena cava is normal in size with greater than 50% respiratory variability, suggesting right atrial pressure of 3 mmHg. FINDINGS  Left Ventricle: Left ventricular ejection fraction, by estimation, is 60 to 65%. The left ventricle has normal function. The left ventricle has no regional wall motion abnormalities. The left ventricular internal cavity size was normal in size. There is  no left ventricular hypertrophy. Left ventricular diastolic parameters are consistent with Grade I diastolic dysfunction (impaired relaxation). Right Ventricle: The right ventricular size is normal. No increase in right ventricular wall thickness. Right ventricular systolic function is normal. Tricuspid regurgitation signal is inadequate for assessing PA pressure. Left Atrium: Left atrial size was normal in size. Right Atrium: Right atrial size was normal in size. Pericardium: There is no evidence of pericardial effusion. Mitral Valve: The mitral valve is grossly normal. Trivial mitral valve regurgitation. No evidence of mitral valve stenosis. Tricuspid Valve: The tricuspid valve is grossly normal. Tricuspid valve regurgitation is trivial. No evidence of tricuspid stenosis. Aortic Valve: The aortic valve is tricuspid. Aortic valve regurgitation is not visualized. No aortic stenosis is present. Aortic valve mean gradient measures 3.0 mmHg. Aortic valve peak gradient measures 6.5 mmHg. Aortic valve area, by VTI measures 3.40 cm. Pulmonic Valve: The pulmonic valve was grossly normal. Pulmonic valve regurgitation is trivial. No evidence of pulmonic stenosis. Aorta: The aortic root and ascending aorta are structurally normal, with no evidence of dilitation. Venous: The right lower pulmonary vein is normal. The inferior vena cava is normal in size with greater than 50% respiratory variability,  suggesting right atrial pressure of 3 mmHg. IAS/Shunts: The atrial septum is grossly normal.  LEFT VENTRICLE PLAX 2D LVIDd:         4.30 cm   Diastology LVIDs:         2.80 cm   LV e' medial:    6.42 cm/s LV PW:         1.00 cm   LV E/e' medial:  8.9 LV IVS:        0.90 cm   LV e' lateral:   10.20 cm/s LVOT diam:     2.30 cm   LV E/e' lateral: 5.6 LV SV:         91 LV SV Index:   45 LVOT Area:     4.15 cm  RIGHT VENTRICLE RV Basal diam:  3.60 cm RV Mid diam:    3.10 cm RV S prime:     15.10 cm/s TAPSE (M-mode): 2.4 cm LEFT ATRIUM             Index        RIGHT ATRIUM           Index LA diam:        3.70 cm 1.84 cm/m   RA Area:     16.20 cm LA Vol (A2C):   45.4 ml 22.62 ml/m  RA Volume:   41.20 ml  20.53 ml/m LA Vol (A4C):   51.6 ml 25.71 ml/m LA Biplane Vol: 49.3 ml 24.56 ml/m  AORTIC VALVE AV Area (Vmax):    3.44 cm AV Area (Vmean):   3.24 cm AV Area (VTI):     3.40 cm AV Vmax:           127.00 cm/s AV Vmean:          84.100 cm/s AV VTI:            0.268 m AV Peak Grad:      6.5 mmHg AV Mean Grad:      3.0 mmHg LVOT Vmax:         105.00 cm/s LVOT Vmean:        65.500 cm/s LVOT VTI:          0.219 m LVOT/AV VTI ratio: 0.82  AORTA Ao Root diam: 3.30 cm Ao Asc diam:  3.60 cm MITRAL VALVE MV Area (PHT): 3.16 cm    SHUNTS MV Decel Time: 240 msec    Systemic VTI:  0.22 m MV E velocity: 57.10 cm/s  Systemic Diam: 2.30 cm MV A velocity: 61.80 cm/s MV E/A ratio:  0.92 Lennie Odor MD Electronically signed by Lennie Odor MD Signature Date/Time: 07/20/2023/3:10:06 PM    Final    CARDIAC CATHETERIZATION  Result Date: 07/19/2023   Prox LAD lesion is 5% stenosed.   The left ventricular systolic function is normal.   LV end diastolic pressure is normal.   The left ventricular ejection fraction is 55-65% by visual estimate. No significant CAD. Mild calcification in the proximal LAD Normal LV function Normal LVEDP Plan: consider other causes of chest pain   DG Chest 2 View  Result Date: 07/19/2023 CLINICAL  DATA:  Chest pain. EXAM: CHEST - 2 VIEW COMPARISON:  07/12/2023 FINDINGS: The lungs are clear without focal pneumonia, edema, pneumothorax or pleural effusion. The cardiopericardial silhouette is within normal limits for size. 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 Electronically Signed   By: Kennith Center M.D.   On: 07/19/2023 16:01   DG Chest Portable 1 View  Result Date: 07/12/2023 CLINICAL DATA:  Chest pain. EXAM: PORTABLE CHEST 1 VIEW COMPARISON:  February 07, 2020. FINDINGS: The heart size and mediastinal contours are within normal limits. Both lungs are clear. The visualized skeletal structures are unremarkable. IMPRESSION: No active disease. Electronically Signed   By: Lupita Raider M.D.   On: 07/12/2023 16:36   Disposition   Pt is being discharged home today in good condition.  Follow-up Plans & Appointments     Follow-up Information     Levi Aland, NP Follow up on 08/13/2023.   Specialty: Cardiology Why: 2:20PM. Cardiology follow up Contact information: [address] [place] [address] [phone]                Discharge Instructions     Diet - low sodium heart healthy   Complete by: As directed    Discharge instructions   Complete by: As directed    No driving for 24 hours. No lifting over 5 lbs for 1 week. No sexual activity for 1 week.  Keep procedure site clean & dry. If you notice increased pain, swelling, bleeding or pus, call/return!  You may shower, but no soaking baths/hot tubs/pools for 1 week.   Increase activity slowly   Complete by: As directed  Discharge Medications   Allergies as of 07/20/2023       Reactions   Amphetamine-dextroamphet Er Palpitations        Medication List     TAKE these medications    Armodafinil 150 MG tablet Take 1 tablet (150 mg total) by mouth in the morning.   aspirin EC 81 MG tablet Take 1 tablet (81 mg total) by mouth  daily.   buPROPion 150 MG 24 hr tablet Commonly known as: WELLBUTRIN XL Take 150 mg by mouth every morning.   cholecalciferol 1000 units tablet Commonly known as: VITAMIN D Take 2,000 Units by mouth daily.   Contour Next Monitor w/Device Kit Test blood sugar as needed What changed:  how much to take how to take this when to take this   diclofenac sodium 1 % Gel Commonly known as: VOLTAREN Apply 3 grams to 3 large joints up to 3 times daily What changed:  how much to take how to take this when to take this reasons to take this additional instructions   diltiazem 30 MG tablet Commonly known as: Cardizem Take 1 tablet (30 mg total) by mouth 4 (four) times daily as needed (elevated heart rates).   DULoxetine 60 MG capsule Commonly known as: CYMBALTA Take 60 mg by mouth daily.   Enbrel Mini 50 MG/ML injection Generic drug: etanercept INJECT 50 MG INTO THE SKIN ONCE A WEEK.   gabapentin 300 MG capsule Commonly known as: NEURONTIN TAKE 1 CAPSULE BY MOUTH 2 TIMES DAILY   glucose blood test strip Test blood sugar once daily as needed What changed:  how much to take how to take this when to take this   Iron 325 (65 Fe) MG Tabs Take by mouth daily.   levothyroxine 88 MCG tablet Commonly known as: SYNTHROID Take 88 mcg by mouth AC breakfast.   methocarbamol 500 MG tablet Commonly known as: ROBAXIN Take 500 mg by mouth 2 (two) times daily as needed for muscle spasms.   metoprolol succinate 25 MG 24 hr tablet Commonly known as: TOPROL-XL Take 12.5 mg by mouth daily. Hold if heart rate is less than 50 beats per minute.   Microlet Lancets Misc Use to check blood sugar as needed What changed:  how much to take how to take this when to take this   multivitamin capsule Take 1 capsule by mouth daily.   pantoprazole 40 MG tablet Commonly known as: Protonix Take 1 tablet (40 mg total) by mouth daily.   Qelbree 200 MG 24 hr capsule Generic drug: viloxazine  ER Take 200 mg by mouth daily.   Repatha SureClick 140 MG/ML Soaj Generic drug: Evolocumab INJECT 140 MG into THE SKIN EVERY 14 DAYS What changed:  how much to take how to take this when to take this   tamsulosin 0.4 MG Caps capsule Commonly known as: FLOMAX Take 0.4 mg by mouth at bedtime.   traZODone 50 MG tablet Commonly known as: DESYREL Take 25 mg by mouth at bedtime. 1/2 tablet at bedtime   VITAMIN B-12 PO Take by mouth daily.           Outstanding Labs/Studies    Duration of Discharge Encounter   Greater than 30 minutes including physician time.  Gerri Spore T. Flora Lipps, MD, [facility]  [facility]  [address] [place], [place] [address] [phone]  4:00 PM

## 2023-07-20 NOTE — Progress Notes (Signed)
Sent message to pharm about pt meds being reconciled and verified Spoke to a technician at [phone] ext they said they would be by today.

## 2023-07-22 ENCOUNTER — Encounter (HOSPITAL_COMMUNITY): Payer: Self-pay | Admitting: Cardiology

## 2023-07-22 LAB — LIPOPROTEIN A (LPA): Lipoprotein (a): 13.4 nmol/L (ref <75.0)

## 2023-07-22 MED FILL — Heparin Sodium (Porcine) Inj 1000 Unit/ML: INTRAMUSCULAR | Qty: 10 | Status: AC

## 2023-07-23 DIAGNOSIS — R0789 Other chest pain: Secondary | ICD-10-CM | POA: Diagnosis not present

## 2023-07-23 DIAGNOSIS — K219 Gastro-esophageal reflux disease without esophagitis: Secondary | ICD-10-CM | POA: Diagnosis not present

## 2023-07-26 ENCOUNTER — Other Ambulatory Visit (HOSPITAL_COMMUNITY): Payer: Self-pay

## 2023-07-30 ENCOUNTER — Other Ambulatory Visit: Payer: Self-pay

## 2023-07-31 ENCOUNTER — Other Ambulatory Visit (HOSPITAL_COMMUNITY): Payer: Self-pay

## 2023-08-07 ENCOUNTER — Encounter: Payer: Self-pay | Admitting: Pharmacist

## 2023-08-07 DIAGNOSIS — Z0271 Encounter for disability determination: Secondary | ICD-10-CM

## 2023-08-12 NOTE — Progress Notes (Unsigned)
Cardiology Office Note:  .   Date:  08/12/2023  ID:  Paul [name], DOB 03-14-1956, MRN [id number] PCP: Georgann Housekeeper, MD  [facility] Providers Cardiologist:  Charlton Haws, MD Electrophysiologist:  Hillis Range, MD (Inactive)    Patient Profile: .      Past medical history: Hyperlipidemia SVT Hypothyroidism Rheumatoid arthritis Chest pain Family history early CAD Father had CABG mid 49s Elevated coronary calcium score CT Cardiac Score 04/24/21 >> CAC 191 (73rd percentile) CT Cardiac Score 08/07/18 >> CAC 133 (72nd percentile) CT Cardiac Score 07/11/16 >> CAC 109 (68th percentile) Low risk ETT 07/26/2016 Anxiety  Depression  Referred to cardiology for chest pain and seen by Dr. Eden Emms in 2017. Had elevated coronary artery calcium score and subsequent low risk ETT in 2017.  He had memory issues and has been followed by neurology.  Works as a Nutritional therapist.  Had elevated CPK and Zetia was discontinued.  Cardiac monitor showed SVT and he was referred to EP cardiology.  Seen 05/01/2023 by Dr. Elberta Fortis for evaluation of SVT. He reported increased episodes of SVT.  Vagal maneuvers were discussed.  Ablation was discussed as an option, however he would like to avoid this for now.  Diltiazem 30 mg as needed was added to daily metoprolol for symptom relief.  Seen by me on 07/19/23 as urgent work-in for chest pain. Was seen earlier today by PCP and referred to [place]. Reported being awakened with severe chest pain this morning at 0200. Felt like someone was kneeling on his chest. Was sleeping with his CPAP on and thought his HR was speeding up. Could not take a deep breath. Pain in right shoulder and neck increased. He had a difficult time getting comfortable. Got up and checked his HR which was 39 bpm, O2 sat was 89%. Pain has improved from rated 9/10 to now 1/10. Similar symptoms occurred last week but not quite as severe. He went to ER, had no acute EKG changes and normal troponins. Pain that  occurred one week ago described as not as severe in mid-sternal region.  Initial symptoms on 8/9 were ight sided neck and shoulder pain. It was difficult at that time to take deep breaths as well. Seen in ER on 8/9, discharged with ibuprofen and muscle relaxer which improved symptoms somewhat but not completely. This week he has felt more fatigued and lightheadeded. Palpitations occur intermittently, generally last about 2 minutes, last occurrence about one week ago. We were concerned about ST changes on EKG in the office and he was transferred to [facility] for cardiac cath.   LHC 07/19/2023 revealed no significant CAD, mild calcification in the proximal LAD, normal LV function and normal LVEDP.  TTE 07/20/2023 revealed LVEF 60 to 65%, no RWMA, G1 DD, normal RV, significant valve disease.       History of Present Illness: Paul [name] is a pleasant 67 y.o. male who is here today with his wife for  ROS: See HPI       Studies Reviewed: .         Risk Assessment/Calculations:     No BP recorded.  {Refresh Note OR Click here to enter BP  :1}***       Physical Exam:   VS:  There were no vitals taken for this visit.   Wt Readings from Last 3 Encounters:  07/19/23 173 lb 12.8 oz (78.8 kg)  07/19/23 177 lb 6.4 oz (80.5 kg)  07/12/23 180 lb 12.4 oz (82  kg)    GEN: Well nourished, well developed in no acute distress NECK: No JVD; No carotid bruits CARDIAC: RRR, no murmurs, rubs, gallops RESPIRATORY:  Clear to auscultation without rales, wheezing or rhonchi  ABDOMEN: Soft, non-tender, non-distended EXTREMITIES:  No edema; No deformity     ASSESSMENT AND PLAN: .    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  Chest pain: Concerning for unstable angina with ST changes noted on EKG. Reviewed with Dr. Lynnette Caffey, DOD, and plan to transport to ED via EMS for possible cardiac cath today.   SVT: Occasional palpitations. Continue BB and prn CCB.   Hyperlipidemia LDL goal < 70: LDL 41 on  01/03/29. Lp(a) 13.4. Continue Repatha.        Dispo: ***  Signed, Eligha Bridegroom, NP-C

## 2023-08-13 ENCOUNTER — Ambulatory Visit: Payer: PPO | Attending: Nurse Practitioner | Admitting: Nurse Practitioner

## 2023-08-13 ENCOUNTER — Encounter: Payer: Self-pay | Admitting: Nurse Practitioner

## 2023-08-13 VITALS — BP 118/60 | HR 60 | Ht 72.0 in | Wt 178.6 lb

## 2023-08-13 DIAGNOSIS — I471 Supraventricular tachycardia, unspecified: Secondary | ICD-10-CM

## 2023-08-13 DIAGNOSIS — Z8249 Family history of ischemic heart disease and other diseases of the circulatory system: Secondary | ICD-10-CM | POA: Diagnosis not present

## 2023-08-13 DIAGNOSIS — E785 Hyperlipidemia, unspecified: Secondary | ICD-10-CM | POA: Diagnosis not present

## 2023-08-13 NOTE — Patient Instructions (Signed)
Medication Instructions:   Your physician recommends that you continue on your current medications as directed. Please refer to the Current Medication list given to you today.   *If you need a refill on your cardiac medications before your next appointment, please call your pharmacy*   Lab Work: Your physician recommends that you return for a FASTING lipid profile/apolipoprotein b/cmet on Thursday, September 12. You can come in on the day of your appointment anytime between 7:30-4:30 fasting from midnight the night before.     If you have labs (blood work) drawn today and your tests are completely normal, you will receive your results only by: MyChart Message (if you have MyChart) OR A paper copy in the mail If you have any lab test that is abnormal or we need to change your treatment, we will call you to review the results.   Testing/Procedures:  None ordered.   Follow-Up: At [facility], you and your health needs are our priority.  As part of our continuing mission to provide you with exceptional heart care, we have created designated Provider Care Teams.  These Care Teams include your primary Cardiologist (physician) and Advanced Practice Providers (APPs -  Physician Assistants and Nurse Practitioners) who all work together to provide you with the care you need, when you need it.  We recommend signing up for the patient portal called "MyChart".  Sign up information is provided on this After Visit Summary.  MyChart is used to connect with patients for Virtual Visits (Telemedicine).  Patients are able to view lab/test results, encounter notes, upcoming appointments, etc.  Non-urgent messages can be sent to your provider as well.   To learn more about what you can do with MyChart, go to [email or web address].    Your next appointment:   5 month(s)  Provider:   Charlton Haws, MD     Other Instructions  Adopting a Healthy Lifestyle.   Weight: Know what a healthy  weight is for you (roughly BMI <25) and aim to maintain this. You can calculate your body mass index on your smart phone. Unfortunately, this is not the most accurate measure of healthy weight, but it is the simplest measurement to use. A more accurate measurement involves body scanning which measures lean muscle, fat tissue and bony density. We do not have this equipment at [facility].    Diet: Aim for 7+ servings of fruits and vegetables daily Limit animal fats in diet for cholesterol and heart health - choose grass fed whenever available Avoid highly processed foods (fast food burgers, tacos, fried chicken, pizza, hot dogs, french fries)  Saturated fat comes in the form of butter, lard, coconut oil, margarine, partially hydrogenated oils, and fat in meat. These increase your risk of cardiovascular disease.  Use healthy plant oils, such as olive, canola, soy, corn, sunflower and peanut.  Whole foods such as fruits, vegetables and whole grains have fiber  Men need > 38 grams of fiber per day Women need > 25 grams of fiber per day  Load up on vegetables and fruits - one-half of your plate: Aim for color and variety, and remember that potatoes dont count. Go for whole grains - one-quarter of your plate: Whole wheat, barley, wheat berries, quinoa, oats, brown rice, and foods made with them. If you want pasta, go with whole wheat pasta. Protein power - one-quarter of your plate: Fish, chicken, beans, and nuts are all healthy, versatile protein sources. Limit red meat. You need carbohydrates for energy! The type  of carbohydrate is more important than the amount. Choose carbohydrates such as vegetables, fruits, whole grains, beans, and nuts in the place of white rice, white pasta, potatoes (baked or fried), macaroni and cheese, cakes, cookies, and donuts.  If youre thirsty, drink water. Coffee and tea are good in moderation, but skip sugary drinks and limit milk and dairy products to one or two daily  servings. Keep sugar intake at 6 teaspoons or 24 grams or LESS       Exercise: Aim for 150 min of moderate intensity exercise weekly for heart health, and weights twice weekly for bone health Stay active - any steps are better than no steps! Aim for 7-9 hours of sleep daily

## 2023-08-15 ENCOUNTER — Ambulatory Visit: Payer: PPO | Attending: Internal Medicine

## 2023-08-15 DIAGNOSIS — Z8249 Family history of ischemic heart disease and other diseases of the circulatory system: Secondary | ICD-10-CM | POA: Diagnosis not present

## 2023-08-15 DIAGNOSIS — R413 Other amnesia: Secondary | ICD-10-CM | POA: Diagnosis not present

## 2023-08-15 DIAGNOSIS — E785 Hyperlipidemia, unspecified: Secondary | ICD-10-CM

## 2023-08-15 DIAGNOSIS — G4710 Hypersomnia, unspecified: Secondary | ICD-10-CM | POA: Diagnosis not present

## 2023-08-15 DIAGNOSIS — I471 Supraventricular tachycardia, unspecified: Secondary | ICD-10-CM

## 2023-08-15 DIAGNOSIS — F909 Attention-deficit hyperactivity disorder, unspecified type: Secondary | ICD-10-CM | POA: Diagnosis not present

## 2023-08-15 DIAGNOSIS — G4733 Obstructive sleep apnea (adult) (pediatric): Secondary | ICD-10-CM | POA: Diagnosis not present

## 2023-08-16 LAB — COMPREHENSIVE METABOLIC PANEL
ALT: 24 IU/L (ref 0–44)
AST: 21 IU/L (ref 0–40)
Albumin: 4.3 g/dL (ref 3.9–4.9)
Alkaline Phosphatase: 87 IU/L (ref 44–121)
BUN/Creatinine Ratio: 18 (ref 10–24)
BUN: 18 mg/dL (ref 8–27)
Bilirubin Total: 0.5 mg/dL (ref 0.0–1.2)
CO2: 25 mmol/L (ref 20–29)
Calcium: 9.3 mg/dL (ref 8.6–10.2)
Chloride: 104 mmol/L (ref 96–106)
Creatinine, Ser: 1.02 mg/dL (ref 0.76–1.27)
Globulin, Total: 2.2 g/dL (ref 1.5–4.5)
Glucose: 99 mg/dL (ref 70–99)
Potassium: 4.6 mmol/L (ref 3.5–5.2)
Sodium: 141 mmol/L (ref 134–144)
Total Protein: 6.5 g/dL (ref 6.0–8.5)
eGFR: 82 mL/min/{1.73_m2} (ref >59)

## 2023-08-16 LAB — LIPID PANEL
Chol/HDL Ratio: 1.9 ratio (ref 0.0–5.0)
Cholesterol, Total: 127 mg/dL (ref 100–199)
HDL: 67 mg/dL (ref >39)
LDL Chol Calc (NIH): 44 mg/dL (ref 0–99)
Triglycerides: 85 mg/dL (ref 0–149)
VLDL Cholesterol Cal: 16 mg/dL (ref 5–40)

## 2023-08-16 LAB — APOLIPOPROTEIN B: Apolipoprotein B: 40 mg/dL (ref <90)
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
## 2023-08-19 DIAGNOSIS — I700 Atherosclerosis of aorta: Secondary | ICD-10-CM | POA: Diagnosis not present

## 2023-08-19 DIAGNOSIS — Z0000 Encounter for general adult medical examination without abnormal findings: Secondary | ICD-10-CM | POA: Diagnosis not present

## 2023-08-19 DIAGNOSIS — I2510 Atherosclerotic heart disease of native coronary artery without angina pectoris: Secondary | ICD-10-CM | POA: Diagnosis not present

## 2023-08-19 DIAGNOSIS — N400 Enlarged prostate without lower urinary tract symptoms: Secondary | ICD-10-CM | POA: Diagnosis not present

## 2023-08-19 DIAGNOSIS — I471 Supraventricular tachycardia, unspecified: Secondary | ICD-10-CM | POA: Diagnosis not present

## 2023-08-19 DIAGNOSIS — F988 Other specified behavioral and emotional disorders with onset usually occurring in childhood and adolescence: Secondary | ICD-10-CM | POA: Diagnosis not present

## 2023-08-19 DIAGNOSIS — M0600 Rheumatoid arthritis without rheumatoid factor, unspecified site: Secondary | ICD-10-CM | POA: Diagnosis not present

## 2023-08-19 DIAGNOSIS — E039 Hypothyroidism, unspecified: Secondary | ICD-10-CM | POA: Diagnosis not present

## 2023-08-19 DIAGNOSIS — E785 Hyperlipidemia, unspecified: Secondary | ICD-10-CM | POA: Diagnosis not present

## 2023-08-19 DIAGNOSIS — K219 Gastro-esophageal reflux disease without esophagitis: Secondary | ICD-10-CM | POA: Diagnosis not present

## 2023-08-19 DIAGNOSIS — F331 Major depressive disorder, recurrent, moderate: Secondary | ICD-10-CM | POA: Diagnosis not present

## 2023-08-19 DIAGNOSIS — R7303 Prediabetes: Secondary | ICD-10-CM | POA: Diagnosis not present

## 2023-08-20 ENCOUNTER — Other Ambulatory Visit (HOSPITAL_COMMUNITY): Payer: Self-pay

## 2023-08-27 ENCOUNTER — Other Ambulatory Visit: Payer: Self-pay | Admitting: Physician Assistant

## 2023-08-27 ENCOUNTER — Other Ambulatory Visit (HOSPITAL_COMMUNITY): Payer: Self-pay

## 2023-08-27 ENCOUNTER — Other Ambulatory Visit: Payer: Self-pay

## 2023-08-27 DIAGNOSIS — R7303 Prediabetes: Secondary | ICD-10-CM | POA: Diagnosis not present

## 2023-08-27 DIAGNOSIS — M0600 Rheumatoid arthritis without rheumatoid factor, unspecified site: Secondary | ICD-10-CM

## 2023-08-27 DIAGNOSIS — E785 Hyperlipidemia, unspecified: Secondary | ICD-10-CM | POA: Diagnosis not present

## 2023-08-27 DIAGNOSIS — Z713 Dietary counseling and surveillance: Secondary | ICD-10-CM | POA: Diagnosis not present

## 2023-08-27 MED ORDER — ENBREL MINI 50 MG/ML ~~LOC~~ SOCT
50.0000 mg | SUBCUTANEOUS | 2 refills | Status: DC
  Filled 2023-08-27: qty 4, 28d supply, fill #0
  Filled 2023-09-24: qty 4, 28d supply, fill #1
  Filled 2023-10-17: qty 4, 28d supply, fill #2

## 2023-08-27 NOTE — Telephone Encounter (Signed)
Last Fill: 05/15/2023  Labs: 07/19/2023 RBC 3.90, Hgb 12.1, Hct 36.4, 08/15/2023 CMP WNL  TB Gold: 05/03/2023 Neg    Next Visit: 08/29/2023  Last Visit: 03/19/2023  XB:[id number] rheumatoid arthritis   Current Dose per office note 03/19/2023: Enbrel 50 mg sq injections once weekly   Okay to refill Enbrel?

## 2023-08-27 NOTE — Progress Notes (Signed)
Specialty Pharmacy Refill Coordination Note  Paul [name] is a 67 y.o. male contacted today regarding refills of specialty medication(s) Etanercept .  Patient requested [name] at [facility] at [place]  on 09/02/23   Medication will be filled on 08/30/23.

## 2023-08-29 ENCOUNTER — Ambulatory Visit: Payer: PPO | Attending: Rheumatology | Admitting: Rheumatology

## 2023-08-29 ENCOUNTER — Encounter: Payer: Self-pay | Admitting: Rheumatology

## 2023-08-29 VITALS — BP 127/79 | HR 60 | Resp 16 | Ht 72.0 in | Wt 181.6 lb

## 2023-08-29 DIAGNOSIS — R748 Abnormal levels of other serum enzymes: Secondary | ICD-10-CM | POA: Diagnosis not present

## 2023-08-29 DIAGNOSIS — M5136 Other intervertebral disc degeneration, lumbar region: Secondary | ICD-10-CM | POA: Diagnosis not present

## 2023-08-29 DIAGNOSIS — Z79899 Other long term (current) drug therapy: Secondary | ICD-10-CM | POA: Diagnosis not present

## 2023-08-29 DIAGNOSIS — Z8659 Personal history of other mental and behavioral disorders: Secondary | ICD-10-CM

## 2023-08-29 DIAGNOSIS — R5383 Other fatigue: Secondary | ICD-10-CM

## 2023-08-29 DIAGNOSIS — Z8639 Personal history of other endocrine, nutritional and metabolic disease: Secondary | ICD-10-CM

## 2023-08-29 DIAGNOSIS — M5030 Other cervical disc degeneration, unspecified cervical region: Secondary | ICD-10-CM

## 2023-08-29 DIAGNOSIS — M19041 Primary osteoarthritis, right hand: Secondary | ICD-10-CM

## 2023-08-29 DIAGNOSIS — F419 Anxiety disorder, unspecified: Secondary | ICD-10-CM | POA: Diagnosis not present

## 2023-08-29 DIAGNOSIS — Z8679 Personal history of other diseases of the circulatory system: Secondary | ICD-10-CM

## 2023-08-29 DIAGNOSIS — Z87438 Personal history of other diseases of male genital organs: Secondary | ICD-10-CM | POA: Diagnosis not present

## 2023-08-29 DIAGNOSIS — M19071 Primary osteoarthritis, right ankle and foot: Secondary | ICD-10-CM

## 2023-08-29 DIAGNOSIS — M19042 Primary osteoarthritis, left hand: Secondary | ICD-10-CM

## 2023-08-29 DIAGNOSIS — M0600 Rheumatoid arthritis without rheumatoid factor, unspecified site: Secondary | ICD-10-CM

## 2023-08-29 DIAGNOSIS — M19072 Primary osteoarthritis, left ankle and foot: Secondary | ICD-10-CM

## 2023-08-29 DIAGNOSIS — F32A Depression, unspecified: Secondary | ICD-10-CM

## 2023-08-29 NOTE — Patient Instructions (Signed)
Standing Labs We placed an order today for your standing lab work.   Please have your standing labs drawn in December and every 3 months  Please have your labs drawn 2 weeks prior to your appointment so that the provider can discuss your lab results at your appointment, if possible.  Please note that you may see your imaging and lab results in MyChart before we have reviewed them. We will contact you once all results are reviewed. Please allow our office up to 72 hours to thoroughly review all of the results before contacting the office for clarification of your results.  WALK-IN LAB HOURS  Monday through Thursday from 8:00 am -12:30 pm and 1:00 pm-5:00 pm and Friday from 8:00 am-12:00 pm.  Patients with office visits requiring labs will be seen before walk-in labs.  You may encounter longer than normal wait times. Please allow additional time. Wait times may be shorter on  Monday and Thursday afternoons.  We do not book appointments for walk-in labs. We appreciate your patience and understanding with our staff.   Labs are drawn by [facility]. Please bring your co-pay at the time of your lab draw.  You may receive a bill from [facility] for your lab work.  Please note if you are on Hydroxychloroquine and and an order has been placed for a Hydroxychloroquine level,  you will need to have it drawn 4 hours or more after your last dose.  If you wish to have your labs drawn at another location, please call the office 24 hours in advance so we can fax the orders.  The office is located at [address], [place], [place] [address]   If you have any questions regarding directions or hours of operation,  please call [phone].   As a reminder, please drink plenty of water prior to coming for your lab work. Thanks!   If you have signs or symptoms of an infection or start antibiotics: First, call your PCP for workup of your infection. Hold your medication through the infection, until you  complete your antibiotics, and until symptoms resolve if you take the following: Injectable medication (Actemra, Benlysta, Cimzia, Cosentyx, Enbrel, Humira, Kevzara, Orencia, Remicade, Simponi, Stelara, Taltz, Tremfya) Methotrexate Leflunomide (Arava) Mycophenolate (Cellcept) Harriette Ohara, Olumiant, or Rinvoq  Vaccines You are taking a medication(s) that can suppress your immune system.  The following immunizations are recommended: Flu annually Covid-19  RSV Td/Tdap (tetanus, diphtheria, pertussis) every 10 years Pneumonia (Prevnar 15 then Pneumovax 23 at least 1 year apart.  Alternatively, can take Prevnar 20 without needing additional dose) Shingrix: 2 doses from 4 weeks to 6 months apart  Please check with your PCP to make sure you are up to date.  Please get an annual skin examination while you are on Enbrel to screen for skin cancer.  Please use sunscreen and sun protection.

## 2023-09-02 ENCOUNTER — Other Ambulatory Visit (HOSPITAL_COMMUNITY): Payer: Self-pay

## 2023-09-02 ENCOUNTER — Ambulatory Visit: Payer: PPO | Admitting: Neurology

## 2023-09-02 ENCOUNTER — Encounter: Payer: Self-pay | Admitting: Neurology

## 2023-09-02 VITALS — BP 113/67 | HR 66 | Ht 72.0 in | Wt 181.0 lb

## 2023-09-02 DIAGNOSIS — G4733 Obstructive sleep apnea (adult) (pediatric): Secondary | ICD-10-CM

## 2023-09-02 DIAGNOSIS — R413 Other amnesia: Secondary | ICD-10-CM

## 2023-09-02 DIAGNOSIS — F988 Other specified behavioral and emotional disorders with onset usually occurring in childhood and adolescence: Secondary | ICD-10-CM | POA: Diagnosis not present
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
## 2023-09-14 DIAGNOSIS — G4733 Obstructive sleep apnea (adult) (pediatric): Secondary | ICD-10-CM | POA: Diagnosis not present

## 2023-09-14 DIAGNOSIS — F909 Attention-deficit hyperactivity disorder, unspecified type: Secondary | ICD-10-CM | POA: Diagnosis not present

## 2023-09-14 DIAGNOSIS — G4710 Hypersomnia, unspecified: Secondary | ICD-10-CM | POA: Diagnosis not present

## 2023-09-14 DIAGNOSIS — R413 Other amnesia: Secondary | ICD-10-CM | POA: Diagnosis not present

## 2023-09-17 ENCOUNTER — Other Ambulatory Visit: Payer: Self-pay | Admitting: Physician Assistant

## 2023-09-20 ENCOUNTER — Other Ambulatory Visit: Payer: Self-pay

## 2023-09-23 ENCOUNTER — Encounter: Payer: Self-pay | Admitting: Physical Medicine and Rehabilitation

## 2023-09-24 ENCOUNTER — Other Ambulatory Visit: Payer: Self-pay

## 2023-09-24 NOTE — Progress Notes (Signed)
Specialty Pharmacy Refill Coordination Note  Paul [name] is a 67 y.o. male contacted today regarding refills of specialty medication(s) Etanercept   Patient requested [name] at [facility] [place] date: 10/02/23   Medication will be filled on 10/01/23.

## 2023-09-25 DIAGNOSIS — G4733 Obstructive sleep apnea (adult) (pediatric): Secondary | ICD-10-CM | POA: Diagnosis not present

## 2023-10-02 ENCOUNTER — Other Ambulatory Visit (HOSPITAL_COMMUNITY): Payer: Self-pay

## 2023-10-07 ENCOUNTER — Encounter: Payer: Self-pay | Admitting: Physical Medicine and Rehabilitation

## 2023-10-07 ENCOUNTER — Encounter: Payer: PPO | Attending: Physical Medicine and Rehabilitation | Admitting: Physical Medicine and Rehabilitation

## 2023-10-07 VITALS — BP 133/82 | HR 60 | Ht 72.0 in | Wt 180.0 lb

## 2023-10-07 DIAGNOSIS — M542 Cervicalgia: Secondary | ICD-10-CM | POA: Diagnosis not present

## 2023-10-07 DIAGNOSIS — G8929 Other chronic pain: Secondary | ICD-10-CM | POA: Diagnosis not present

## 2023-10-07 DIAGNOSIS — G894 Chronic pain syndrome: Secondary | ICD-10-CM | POA: Diagnosis not present

## 2023-10-07 DIAGNOSIS — R52 Pain, unspecified: Secondary | ICD-10-CM | POA: Diagnosis not present

## 2023-10-07 DIAGNOSIS — R5382 Chronic fatigue, unspecified: Secondary | ICD-10-CM | POA: Insufficient documentation

## 2023-10-07 DIAGNOSIS — Z5181 Encounter for therapeutic drug level monitoring: Secondary | ICD-10-CM | POA: Insufficient documentation

## 2023-10-07 DIAGNOSIS — Z79899 Other long term (current) drug therapy: Secondary | ICD-10-CM | POA: Diagnosis not present

## 2023-10-07 DIAGNOSIS — M545 Low back pain, unspecified: Secondary | ICD-10-CM | POA: Diagnosis not present

## 2023-10-07 NOTE — Patient Instructions (Addendum)
Today, you had a urine drug screen and signed a pain contract.  As long as results are as expected, we will start you on a Butrans patch 5 mcg/hour 1 patch weekly.  I will message you through MyChart once we have results on this prescription sent.  Follow-up with my nurse practitioner Paul [name] in 1 month.  If pain is well-controlled on your current regimen, you will see her every other month and me every 6 months.  If not, we will follow-up more frequently.  Feel free to use MyChart between appointments to discuss any acute issues, making usually get back to within 48 hours.  Start aqua therapy to improve your mobilization, endurance, pain control and fatigue.  If after 2-3 sessions, you are not noticing a benefit, I would ask your PT to develop a home exercise program so that you can maintain your mobility.
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
## 2023-10-10 ENCOUNTER — Encounter: Payer: Self-pay | Admitting: Physical Medicine and Rehabilitation

## 2023-10-10 DIAGNOSIS — G894 Chronic pain syndrome: Secondary | ICD-10-CM

## 2023-10-10 DIAGNOSIS — M545 Low back pain, unspecified: Secondary | ICD-10-CM

## 2023-10-10 DIAGNOSIS — R5382 Chronic fatigue, unspecified: Secondary | ICD-10-CM

## 2023-10-10 DIAGNOSIS — G8929 Other chronic pain: Secondary | ICD-10-CM

## 2023-10-10 LAB — TOXASSURE SELECT,+ANTIDEPR,UR

## 2023-10-11 ENCOUNTER — Telehealth: Payer: Self-pay | Admitting: Physical Medicine and Rehabilitation

## 2023-10-11 ENCOUNTER — Other Ambulatory Visit: Payer: Self-pay | Admitting: Physical Medicine and Rehabilitation

## 2023-10-11 DIAGNOSIS — M542 Cervicalgia: Secondary | ICD-10-CM

## 2023-10-11 DIAGNOSIS — M47812 Spondylosis without myelopathy or radiculopathy, cervical region: Secondary | ICD-10-CM

## 2023-10-11 MED ORDER — BUPRENORPHINE 5 MCG/HR TD PTWK: 1.0000 | MEDICATED_PATCH | TRANSDERMAL | 0 refills | Status: DC

## 2023-10-11 MED ORDER — NALOXONE HCL 4 MG/0.1ML NA LIQD: NASAL | 0 refills | Status: DC

## 2023-10-11 NOTE — Progress Notes (Signed)
Patient reports greater than 80% relief of pain for 8 plus months with previous bilateral C5-C6 and C6-C7 radiofrequency ablation performed in our office on 11/15/2022. I will place order to repeat RFA.

## 2023-10-11 NOTE — Telephone Encounter (Signed)
Patient called and wanted to set up an appointment for an abrasion in his neck. [phone]

## 2023-10-16 ENCOUNTER — Telehealth: Payer: Self-pay | Admitting: *Deleted

## 2023-10-16 DIAGNOSIS — G894 Chronic pain syndrome: Secondary | ICD-10-CM

## 2023-10-16 DIAGNOSIS — M545 Low back pain, unspecified: Secondary | ICD-10-CM

## 2023-10-16 DIAGNOSIS — Z79899 Other long term (current) drug therapy: Secondary | ICD-10-CM

## 2023-10-16 MED ORDER — NALOXONE HCL 4 MG/0.1ML NA LIQD: NASAL | 0 refills | Status: DC

## 2023-10-16 NOTE — Telephone Encounter (Signed)
Narcan is over $100 at the pharmacy and they cannot afford this cost. I have found it at [facility] for $36 with good rx, so can you send an rx to [facility] for 1 box which contains two 4mg  nasal sprays? I have put the pharmacy in for you.

## 2023-10-17 ENCOUNTER — Other Ambulatory Visit: Payer: Self-pay | Admitting: Neurology

## 2023-10-17 ENCOUNTER — Other Ambulatory Visit (HOSPITAL_COMMUNITY): Payer: Self-pay

## 2023-10-17 NOTE — Progress Notes (Signed)
Specialty Pharmacy Refill Coordination Note  Spoke to [name], spouse of Paul [name] a 67 y.o. male contacted today regarding refills of specialty medication(s) Etanercept   Patient requested [name] at [facility] at [place] date: 10/29/23   Medication will be filled on 10/28/23.

## 2023-10-17 NOTE — Progress Notes (Signed)
Specialty Pharmacy Ongoing Clinical Assessment Note  Spoke to Paul [name], spouse of Paul [name] a 67 y.o. male who is being followed by the specialty pharmacy service for RxSp Rheumatoid Arthritis   Patient's specialty medication(s) reviewed today: Etanercept   Missed doses in the last 4 weeks: 0   Patient/Caregiver did not have any additional questions or concerns.   Therapeutic benefit summary: Patient is achieving benefit   Adverse events/side effects summary: No adverse events/side effects   Patient's therapy is appropriate to: Continue    Goals Addressed             This Visit's Progress    Minimize recurrence of flares       Patient is on track. Patient will maintain adherence.  Patent's wife reports that he remains well-controlled.          Follow up:  6 months  Servando Snare Specialty Pharmacist

## 2023-10-17 NOTE — Telephone Encounter (Addendum)
Last seen on 09/02/23 Follow up scheduled on 09/01/24 Last filled on 10/17/23 #90 tablets (90 day supply)    Dr.Sater Rx to be denied just filled

## 2023-10-23 DIAGNOSIS — G4733 Obstructive sleep apnea (adult) (pediatric): Secondary | ICD-10-CM | POA: Diagnosis not present

## 2023-10-24 ENCOUNTER — Other Ambulatory Visit: Payer: Self-pay

## 2023-10-24 ENCOUNTER — Ambulatory Visit: Payer: PPO | Admitting: Physical Medicine and Rehabilitation

## 2023-10-24 DIAGNOSIS — M47812 Spondylosis without myelopathy or radiculopathy, cervical region: Secondary | ICD-10-CM

## 2023-10-24 DIAGNOSIS — M542 Cervicalgia: Secondary | ICD-10-CM

## 2023-10-24 MED ORDER — METHYLPREDNISOLONE ACETATE 40 MG/ML IJ SUSP: 40.0000 mg | Freq: Once | INTRAMUSCULAR | Status: DC

## 2023-10-24 NOTE — Patient Instructions (Signed)

## 2023-10-24 NOTE — Progress Notes (Signed)
Patient spoke with Dr. Alvester Morin about scheduling appointment to discuss lumbar radiofrequency ablation. Dr Alvester Morin and myself reviewed lumbar MRI imaging from 2023, there is facet arthropathy at L3-L4 and L4-L5. Would consider facet joint injections.

## 2023-10-28 ENCOUNTER — Ambulatory Visit: Payer: PPO | Admitting: Physical Medicine and Rehabilitation

## 2023-10-28 ENCOUNTER — Encounter: Payer: Self-pay | Admitting: Physical Medicine and Rehabilitation

## 2023-10-28 DIAGNOSIS — G8929 Other chronic pain: Secondary | ICD-10-CM

## 2023-10-28 DIAGNOSIS — M47819 Spondylosis without myelopathy or radiculopathy, site unspecified: Secondary | ICD-10-CM | POA: Diagnosis not present

## 2023-10-28 DIAGNOSIS — M47816 Spondylosis without myelopathy or radiculopathy, lumbar region: Secondary | ICD-10-CM | POA: Diagnosis not present

## 2023-10-28 DIAGNOSIS — M545 Low back pain, unspecified: Secondary | ICD-10-CM | POA: Diagnosis not present
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
## 2023-10-28 NOTE — Progress Notes (Signed)
Mri lumbar 1 year ago.  Constant pain.  No numbness / tingling  Pretty much the same feeling as when he was seeing Dr. Otelia Sergeant no real change.

## 2023-10-29 ENCOUNTER — Other Ambulatory Visit (HOSPITAL_COMMUNITY): Payer: Self-pay

## 2023-11-04 ENCOUNTER — Other Ambulatory Visit: Payer: Self-pay

## 2023-11-04 ENCOUNTER — Ambulatory Visit: Payer: PPO | Admitting: Physical Medicine and Rehabilitation

## 2023-11-04 DIAGNOSIS — M47812 Spondylosis without myelopathy or radiculopathy, cervical region: Secondary | ICD-10-CM

## 2023-11-04 DIAGNOSIS — M542 Cervicalgia: Secondary | ICD-10-CM

## 2023-11-04 NOTE — Patient Instructions (Signed)

## 2023-11-05 NOTE — Procedures (Signed)
Cervical Facet Nerve Denervation  Patient: Paul [name]      Date of Birth: 1956/06/04 MRN: [id number] PCP: Georgann Housekeeper, MD      Visit Date: 11/04/2023   Universal Protocol:    Date/Time: 12/03/241:30 PM  Consent Given By: the patient  Position: PRONE  Additional Comments: Vital signs were monitored before and after the procedure. Patient was prepped and draped in the usual sterile fashion. The correct patient, procedure, and site was verified.   Injection Procedure Details:   Procedure diagnoses:  1. Cervical spondylosis without myelopathy   2. Cervicalgia      Meds Administered: No orders of the defined types were placed in this encounter.    Laterality: Right  Location/Site:  C5-6 C6-7  Needle size: 18 G  Needle type: RF cannula, 5 mm active tip  Findings:  -Comments:   Procedure Details: The fluoroscope beam was positioned to square off the endplates of the desired vertebral level to achieve a true AP position. The beam was then moved in a small "counter" oblique to the contralateral side with a small amount of caudal tilt to achieve a trajectory alignment with the desired nerves.  For each target described below the skin was anesthetized with 1 ml of 1% Lidocaine without epinephrine.  To denervate the facet joint nerve to C2 (third occipital nerve), the cannula was advanced under fluoroscopic guidance and positioned over the inferior lateral portion of the C2/3 facet joint nerve where the third occipital nerve lies.  A minimum of three lesions were made along the location of the nerve.  To denervate the facet joint nerves from C3 through C7, the lateral masses of these respective levels were localized under fluoroscopic visualization.  An outer 20 gauge, 5mm active tip cannula was inserted down the "waist" at the above mentioned cervical levels. The needle was then "walked off" until it rested just lateral to the trough of the lateral mass of the medial  branch nerve, which innervates the cervical facet joint.  To denervate the C8 facet joint nerve, the cannula was fluoroscopically introduced onto the Tl transverse process at its most medial superior end.  For all of these levels, AP and lateral images were used to confirm location.  The radiofrequency probe was inserted into the cannula and stimulation was carried out at both sensory and motor levels to make sure there was expected stimulation without a radicular pattern. Subsequently, this was removed and then 0.5 to 1 ml. of 1% Lidocaine was injected. The radiofrequency probe was re-inserted and denervation of the facet nerves (medial branches of the dorsal rami innervating the facet joints) was carried out at 80 degrees Celsius for 90 seconds. The above procedure was repeated for each facet joint nerve mentioned above. Radiographs were obtained at each level (unless otherwise noted) to verify probe placement during the neurotomy.  Additional Comments:  The patient tolerated the procedure well Dressing: Band-Aid    Post-procedure details: Patient was observed during the procedure. Post-procedure instructions were reviewed. Patient left the clinic in stable condition.

## 2023-11-05 NOTE — Progress Notes (Signed)
Paul [name] - 67 y.o. male MRN [id number]  Date of birth: Dec 24, 1955  Office Visit Note: Visit Date: 11/04/2023 PCP: Georgann Housekeeper, MD Referred by: Georgann Housekeeper, MD  Subjective: Chief Complaint  Patient presents with   Neck - Pain   HPI:  Paul [name] is a 67 y.o. male who comes in todayfor planned repeat radiofrequency ablation of the Right C5-6 and C6-7  Cervical facet joints. This would be ablation of the corresponding medial branches and/or dorsal rami.  Patient has had double diagnostic blocks with more than 70% relief.  Subsequent ablation gave them more than 6 months of over 60% relief.  They have had chronic neck pain for quite some time, more than 3 months, which has been an ongoing situation with recalcitrant axial back pain.  They have no radicular pain.  Their axial pain is worse with standing and ambulating and on exam today with facet loading.  They have had physical therapy as well as home exercise program.  The imaging noted in the chart below indicated facet pathology. Accordingly they meet all the criteria and qualification for for radiofrequency ablation and we are going to complete this today hopefully for more longer term relief as part of comprehensive management program.   ROS Otherwise per HPI.  Assessment & Plan: Visit Diagnoses:    ICD-10-CM   1. Cervical spondylosis without myelopathy  M47.812 XR C-ARM NO REPORT    Radiofrequency,Cervical    2. Cervicalgia  M54.2 XR C-ARM NO REPORT    Radiofrequency,Cervical      Plan: No additional findings.   Meds & Orders: No orders of the defined types were placed in this encounter.   Orders Placed This Encounter  Procedures   Radiofrequency,Cervical   XR C-ARM NO REPORT    Follow-up: Return if symptoms worsen or fail to improve.   Procedures: No procedures performed  Cervical Facet Nerve Denervation  Patient: Paul [name]      Date of Birth: 08/23/1956 MRN: [id number] PCP: Georgann Housekeeper, MD      Visit Date: 11/04/2023   Universal Protocol:    Date/Time: 12/03/241:30 PM  Consent Given By: the patient  Position: PRONE  Additional Comments: Vital signs were monitored before and after the procedure. Patient was prepped and draped in the usual sterile fashion. The correct patient, procedure, and site was verified.   Injection Procedure Details:   Procedure diagnoses:  1. Cervical spondylosis without myelopathy   2. Cervicalgia      Meds Administered: No orders of the defined types were placed in this encounter.    Laterality: Right  Location/Site:  C5-6 C6-7  Needle size: 18 G  Needle type: RF cannula, 5 mm active tip  Findings:  -Comments:   Procedure Details: The fluoroscope beam was positioned to square off the endplates of the desired vertebral level to achieve a true AP position. The beam was then moved in a small "counter" oblique to the contralateral side with a small amount of caudal tilt to achieve a trajectory alignment with the desired nerves.  For each target described below the skin was anesthetized with 1 ml of 1% Lidocaine without epinephrine.  To denervate the facet joint nerve to C2 (third occipital nerve), the cannula was advanced under fluoroscopic guidance and positioned over the inferior lateral portion of the C2/3 facet joint nerve where the third occipital nerve lies.  A minimum of three lesions were made along the location of the nerve.  To denervate the  facet joint nerves from C3 through C7, the lateral masses of these respective levels were localized under fluoroscopic visualization.  An outer 20 gauge, 5mm active tip cannula was inserted down the "waist" at the above mentioned cervical levels. The needle was then "walked off" until it rested just lateral to the trough of the lateral mass of the medial branch nerve, which innervates the cervical facet joint.  To denervate the C8 facet joint nerve, the cannula was  fluoroscopically introduced onto the Tl transverse process at its most medial superior end.  For all of these levels, AP and lateral images were used to confirm location.  The radiofrequency probe was inserted into the cannula and stimulation was carried out at both sensory and motor levels to make sure there was expected stimulation without a radicular pattern. Subsequently, this was removed and then 0.5 to 1 ml. of 1% Lidocaine was injected. The radiofrequency probe was re-inserted and denervation of the facet nerves (medial branches of the dorsal rami innervating the facet joints) was carried out at 80 degrees Celsius for 90 seconds. The above procedure was repeated for each facet joint nerve mentioned above. Radiographs were obtained at each level (unless otherwise noted) to verify probe placement during the neurotomy.  Additional Comments:  The patient tolerated the procedure well Dressing: Band-Aid    Post-procedure details: Patient was observed during the procedure. Post-procedure instructions were reviewed. Patient left the clinic in stable condition.      Clinical History: CLINICAL DATA:  Low back pain for 6 years.  No known injury.   EXAM: MRI LUMBAR SPINE WITHOUT CONTRAST   TECHNIQUE: Multiplanar, multisequence MR imaging of the lumbar spine was performed. No intravenous contrast was administered.   COMPARISON:  None Available.   FINDINGS: Segmentation:  Standard.   Alignment: 2 mm retrolisthesis of L1 on L2, L2 on L3, L3 on L4 and L4 on L5. mild dextro curvature of the thoracolumbar spine.   Vertebrae: No acute fracture, evidence of discitis, or aggressive bone lesion.   Conus medullaris and cauda equina: Conus extends to the T12-L1 level. Conus and cauda equina appear normal.   Paraspinal and other soft tissues: No acute paraspinal abnormality.   Disc levels:   Disc spaces: Degenerative disease with disc height loss at T11-12, T12-L1, L1-2, L2-3, L4-5 and  L5-S1. Severe reactive endplate edema at U0-4.   T12-L1: Mild broad-based disc bulge. No foraminal or central canal stenosis.   L1-L2: Broad-based disc osteophyte complex. Small left paracentral disc protrusion. Mild bilateral facet arthropathy. Moderate-severe left foraminal stenosis. Moderate-severe scratch them moderate right foraminal stenosis. Scratch them moderate right foraminal stenosis. Minimal spinal stenosis. Bilateral lateral recess stenosis.   L2-L3: Broad-based disc bulge. Mild bilateral facet arthropathy. Mild left foraminal stenosis. No right foraminal stenosis. No spinal stenosis.   L3-L4: Broad-based disc bulge. Moderate bilateral facet arthropathy. Right lateral recess stenosis. Moderate right foraminal stenosis. No left foraminal stenosis. Mild spinal stenosis.   L4-L5: Broad-based disc bulge. Mild bilateral facet arthropathy. Moderate right foraminal stenosis. No left foraminal stenosis. No spinal stenosis.   L5-S1: Broad-based disc bulge with a small right foraminal disc protrusion contacting the right exiting L5 nerve root. Mild bilateral facet arthropathy. Moderate right foraminal stenosis. Mild left foraminal stenosis.   IMPRESSION: 1. At L1-2 there is a broad-based disc osteophyte complex. Small left paracentral disc protrusion. Mild bilateral facet arthropathy. Moderate-severe left foraminal stenosis. Moderate-severe scratch them moderate right foraminal stenosis. Bilateral lateral recess stenosis. 2. At L3-4 there is a broad-based  disc bulge. Moderate bilateral facet arthropathy. Right lateral recess stenosis. Moderate right foraminal stenosis. No left foraminal stenosis. Mild spinal stenosis. 3. At L4-5 there is a broad-based disc bulge. Mild bilateral facet arthropathy. Moderate right foraminal stenosis. 4. At L5-S1 there is a broad-based disc bulge with a small right foraminal disc protrusion contacting the right exiting L5 nerve root. Mild  bilateral facet arthropathy. Moderate right foraminal stenosis. Mild left foraminal stenosis. 5. No acute osseous injury of the lumbar spine.     Electronically Signed   By: Elige Ko M.D.   On: 10/25/2022 09:09     Objective:  VS:  HT:    WT:   BMI:     BP:   HR: bpm  TEMP: ( )  RESP:  Physical Exam Vitals and nursing note reviewed.  Constitutional:      General: He is not in acute distress.    Appearance: Normal appearance. He is not ill-appearing.  HENT:     Head: Normocephalic and atraumatic.     Right Ear: External ear normal.     Left Ear: External ear normal.  Eyes:     Extraocular Movements: Extraocular movements intact.  Cardiovascular:     Rate and Rhythm: Normal rate.     Pulses: Normal pulses.  Abdominal:     General: There is no distension.     Palpations: Abdomen is soft.  Musculoskeletal:        General: No signs of injury.     Cervical back: Neck supple. Tenderness present. No rigidity.     Right lower leg: No edema.     Left lower leg: No edema.     Comments: Patient has good strength in the upper extremities with 5 out of 5 strength in wrist extension long finger flexion APB.  No intrinsic hand muscle atrophy.  Negative Hoffmann's test.  Lymphadenopathy:     Cervical: No cervical adenopathy.  Skin:    Findings: No erythema or rash.  Neurological:     General: No focal deficit present.     Mental Status: He is alert and oriented to person, place, and time.     Sensory: No sensory deficit.     Motor: No weakness or abnormal muscle tone.     Coordination: Coordination normal.  Psychiatric:        Mood and Affect: Mood normal.        Behavior: Behavior normal.      Imaging: XR C-ARM NO REPORT  Result Date: 11/04/2023 Please see Notes tab for imaging impression.

## 2023-11-06 ENCOUNTER — Encounter: Payer: PPO | Attending: Physical Medicine and Rehabilitation | Admitting: Physical Medicine and Rehabilitation

## 2023-11-06 DIAGNOSIS — M545 Low back pain, unspecified: Secondary | ICD-10-CM | POA: Diagnosis not present

## 2023-11-06 DIAGNOSIS — R5382 Chronic fatigue, unspecified: Secondary | ICD-10-CM | POA: Insufficient documentation

## 2023-11-06 DIAGNOSIS — Z5181 Encounter for therapeutic drug level monitoring: Secondary | ICD-10-CM | POA: Diagnosis not present

## 2023-11-06 DIAGNOSIS — Z91048 Other nonmedicinal substance allergy status: Secondary | ICD-10-CM | POA: Insufficient documentation

## 2023-11-06 DIAGNOSIS — G8929 Other chronic pain: Secondary | ICD-10-CM | POA: Diagnosis not present

## 2023-11-06 DIAGNOSIS — R52 Pain, unspecified: Secondary | ICD-10-CM | POA: Diagnosis not present

## 2023-11-06 DIAGNOSIS — M542 Cervicalgia: Secondary | ICD-10-CM | POA: Insufficient documentation

## 2023-11-06 DIAGNOSIS — G894 Chronic pain syndrome: Secondary | ICD-10-CM | POA: Insufficient documentation

## 2023-11-06 MED ORDER — BUPRENORPHINE 5 MCG/HR TD PTWK: 1.0000 | MEDICATED_PATCH | TRANSDERMAL | 0 refills | Status: AC
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
## 2023-11-06 NOTE — Patient Instructions (Addendum)
Use benadryll cream before applying patch and avoid taping edges for rash; if no improvement after next 2 patches, message me and we will switch to the oral formulation  Follow up Q3M  Start aquatherapy ,and avoid overexertion

## 2023-11-07 ENCOUNTER — Other Ambulatory Visit: Payer: Self-pay

## 2023-11-07 ENCOUNTER — Ambulatory Visit (HOSPITAL_BASED_OUTPATIENT_CLINIC_OR_DEPARTMENT_OTHER): Payer: PPO | Attending: Physical Medicine and Rehabilitation | Admitting: Physical Therapy

## 2023-11-07 ENCOUNTER — Encounter (HOSPITAL_BASED_OUTPATIENT_CLINIC_OR_DEPARTMENT_OTHER): Payer: Self-pay | Admitting: Physical Therapy

## 2023-11-07 DIAGNOSIS — G8929 Other chronic pain: Secondary | ICD-10-CM | POA: Diagnosis not present

## 2023-11-07 DIAGNOSIS — M545 Low back pain, unspecified: Secondary | ICD-10-CM | POA: Diagnosis not present

## 2023-11-07 DIAGNOSIS — M6281 Muscle weakness (generalized): Secondary | ICD-10-CM | POA: Diagnosis not present

## 2023-11-07 DIAGNOSIS — Z7409 Other reduced mobility: Secondary | ICD-10-CM | POA: Diagnosis not present

## 2023-11-07 DIAGNOSIS — M069 Rheumatoid arthritis, unspecified: Secondary | ICD-10-CM | POA: Insufficient documentation

## 2023-11-07 DIAGNOSIS — R293 Abnormal posture: Secondary | ICD-10-CM | POA: Insufficient documentation

## 2023-11-07 DIAGNOSIS — R269 Unspecified abnormalities of gait and mobility: Secondary | ICD-10-CM | POA: Insufficient documentation

## 2023-11-07 DIAGNOSIS — M542 Cervicalgia: Secondary | ICD-10-CM | POA: Diagnosis not present

## 2023-11-07 DIAGNOSIS — Z5189 Encounter for other specified aftercare: Secondary | ICD-10-CM | POA: Insufficient documentation

## 2023-11-07 DIAGNOSIS — R5382 Chronic fatigue, unspecified: Secondary | ICD-10-CM | POA: Diagnosis not present

## 2023-11-07 DIAGNOSIS — R262 Difficulty in walking, not elsewhere classified: Secondary | ICD-10-CM | POA: Insufficient documentation

## 2023-11-07 DIAGNOSIS — M5459 Other low back pain: Secondary | ICD-10-CM
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
## 2023-11-11 DIAGNOSIS — R413 Other amnesia: Secondary | ICD-10-CM | POA: Diagnosis not present

## 2023-11-11 DIAGNOSIS — G4733 Obstructive sleep apnea (adult) (pediatric): Secondary | ICD-10-CM | POA: Diagnosis not present

## 2023-11-11 DIAGNOSIS — G4710 Hypersomnia, unspecified: Secondary | ICD-10-CM | POA: Diagnosis not present

## 2023-11-11 DIAGNOSIS — F909 Attention-deficit hyperactivity disorder, unspecified type: Secondary | ICD-10-CM | POA: Diagnosis not present

## 2023-11-12 ENCOUNTER — Encounter (HOSPITAL_BASED_OUTPATIENT_CLINIC_OR_DEPARTMENT_OTHER): Payer: Self-pay | Admitting: Physical Therapy

## 2023-11-12 ENCOUNTER — Ambulatory Visit (HOSPITAL_BASED_OUTPATIENT_CLINIC_OR_DEPARTMENT_OTHER): Payer: PPO | Admitting: Physical Therapy

## 2023-11-12 DIAGNOSIS — M542 Cervicalgia: Secondary | ICD-10-CM | POA: Diagnosis not present

## 2023-11-12 DIAGNOSIS — M5459 Other low back pain: Secondary | ICD-10-CM

## 2023-11-12 DIAGNOSIS — M6281 Muscle weakness (generalized): Secondary | ICD-10-CM
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
## 2023-11-14 ENCOUNTER — Other Ambulatory Visit: Payer: Self-pay

## 2023-11-14 ENCOUNTER — Ambulatory Visit: Payer: PPO | Admitting: Physical Medicine and Rehabilitation

## 2023-11-14 DIAGNOSIS — M47816 Spondylosis without myelopathy or radiculopathy, lumbar region: Secondary | ICD-10-CM | POA: Diagnosis not present

## 2023-11-14 MED ORDER — BUPIVACAINE HCL 0.5 % IJ SOLN
3.0000 mL | Freq: Once | INTRAMUSCULAR | Status: AC
  Administered 2023-11-14: 3 mL

## 2023-11-14 NOTE — Patient Instructions (Signed)

## 2023-11-14 NOTE — Progress Notes (Signed)
Paul [name] - 67 y.o. male MRN [id number]  Date of birth: [birth year]  Office Visit Note: Visit Date: 11/14/2023 PCP: Georgann Housekeeper, MD Referred by: Georgann Housekeeper, MD  Subjective: Chief Complaint  Patient presents with   Lower Back - Pain   HPI:  Paul [name] is a 67 y.o. male who comes in today at the request of Ellin Goodie, FNP for planned Bilateral  L2-3 and L3-4 Lumbar facet/medial branch block with fluoroscopic guidance.  The patient has failed conservative care including home exercise, medications, time and activity modification.  This injection will be diagnostic and hopefully therapeutic.  Please see requesting physician notes for further details and justification.  Exam has shown concordant pain with facet joint loading.   ROS Otherwise per HPI.  Assessment & Plan: Visit Diagnoses:    ICD-10-CM   1. Spondylosis without myelopathy or radiculopathy, lumbar region  M47.816 XR C-ARM NO REPORT    Nerve Block    bupivacaine (MARCAINE) 0.5 % (with pres) injection 3 mL      Plan: No additional findings.   Meds & Orders:  Meds ordered this encounter  Medications   bupivacaine (MARCAINE) 0.5 % (with pres) injection 3 mL    Orders Placed This Encounter  Procedures   Nerve Block   XR C-ARM NO REPORT    Follow-up: Return for Review Pain Diary.   Procedures: No procedures performed  Lumbar Diagnostic Facet Joint Nerve Block with Fluoroscopic Guidance   Patient: Paul [name]      Date of Birth: 1956-06-15 MRN: [id number] PCP: Georgann Housekeeper, MD      Visit Date: 11/14/2023   Universal Protocol:    Date/Time: 11/13/2410:12 AM  Consent Given By: the patient  Position: PRONE  Additional Comments: Vital signs were monitored before and after the procedure. Patient was prepped and draped in the usual sterile fashion. The correct patient, procedure, and site was verified.   Injection Procedure Details:   Procedure diagnoses:  1. Spondylosis  without myelopathy or radiculopathy, lumbar region      Meds Administered:  Meds ordered this encounter  Medications   bupivacaine (MARCAINE) 0.5 % (with pres) injection 3 mL     Laterality: Bilateral  Location/Site: L2-L3, L1 and L2 medial branches and L3-L4, L2 and L3 medial branches  Needle: 5.0 in., 25 ga.  Short bevel or Quincke spinal needle  Needle Placement: Oblique pedical  Findings:   -Comments: There was excellent flow of contrast along the articular pillars without intravascular flow.  Procedure Details: The fluoroscope beam is vertically oriented in AP and then obliqued 15 to 20 degrees to the ipsilateral side of the desired nerve to achieve the "Scotty dog" appearance.  The skin over the target area of the junction of the superior articulating process and the transverse process (sacral ala if blocking the L5 dorsal rami) was locally anesthetized with a 1 ml volume of 1% Lidocaine without Epinephrine.  The spinal needle was inserted and advanced in a trajectory view down to the target.   After contact with periosteum and negative aspirate for blood and CSF, correct placement without intravascular or epidural spread was confirmed by injecting 0.5 ml. of Isovue-250.  A spot radiograph was obtained of this image.    Next, a 0.5 ml. volume of the injectate described above was injected. The needle was then redirected to the other facet joint nerves mentioned above if needed.  Prior to the procedure, the patient was given a Pain Diary which was  completed for baseline measurements.  After the procedure, the patient rated their pain every 30 minutes and will continue rating at this frequency for a total of 5 hours.  The patient has been asked to complete the Diary and return to [place] by mail, fax or hand delivered as soon as possible.   Additional Comments:  The patient tolerated the procedure well Dressing: 2 x 2 sterile gauze and Band-Aid    Post-procedure details: Patient  was observed during the procedure. Post-procedure instructions were reviewed.  Patient left the clinic in stable condition.   Clinical History: CLINICAL DATA:  Low back pain for 6 years.  No known injury.   EXAM: MRI LUMBAR SPINE WITHOUT CONTRAST   TECHNIQUE: Multiplanar, multisequence MR imaging of the lumbar spine was performed. No intravenous contrast was administered.   COMPARISON:  None Available.   FINDINGS: Segmentation:  Standard.   Alignment: 2 mm retrolisthesis of L1 on L2, L2 on L3, L3 on L4 and L4 on L5. mild dextro curvature of the thoracolumbar spine.   Vertebrae: No acute fracture, evidence of discitis, or aggressive bone lesion.   Conus medullaris and cauda equina: Conus extends to the T12-L1 level. Conus and cauda equina appear normal.   Paraspinal and other soft tissues: No acute paraspinal abnormality.   Disc levels:   Disc spaces: Degenerative disease with disc height loss at T11-12, T12-L1, L1-2, L2-3, L4-5 and L5-S1. Severe reactive endplate edema at U1-3.   T12-L1: Mild broad-based disc bulge. No foraminal or central canal stenosis.   L1-L2: Broad-based disc osteophyte complex. Small left paracentral disc protrusion. Mild bilateral facet arthropathy. Moderate-severe left foraminal stenosis. Moderate-severe scratch them moderate right foraminal stenosis. Scratch them moderate right foraminal stenosis. Minimal spinal stenosis. Bilateral lateral recess stenosis.   L2-L3: Broad-based disc bulge. Mild bilateral facet arthropathy. Mild left foraminal stenosis. No right foraminal stenosis. No spinal stenosis.   L3-L4: Broad-based disc bulge. Moderate bilateral facet arthropathy. Right lateral recess stenosis. Moderate right foraminal stenosis. No left foraminal stenosis. Mild spinal stenosis.   L4-L5: Broad-based disc bulge. Mild bilateral facet arthropathy. Moderate right foraminal stenosis. No left foraminal stenosis. No spinal stenosis.    L5-S1: Broad-based disc bulge with a small right foraminal disc protrusion contacting the right exiting L5 nerve root. Mild bilateral facet arthropathy. Moderate right foraminal stenosis. Mild left foraminal stenosis.   IMPRESSION: 1. At L1-2 there is a broad-based disc osteophyte complex. Small left paracentral disc protrusion. Mild bilateral facet arthropathy. Moderate-severe left foraminal stenosis. Moderate-severe scratch them moderate right foraminal stenosis. Bilateral lateral recess stenosis. 2. At L3-4 there is a broad-based disc bulge. Moderate bilateral facet arthropathy. Right lateral recess stenosis. Moderate right foraminal stenosis. No left foraminal stenosis. Mild spinal stenosis. 3. At L4-5 there is a broad-based disc bulge. Mild bilateral facet arthropathy. Moderate right foraminal stenosis. 4. At L5-S1 there is a broad-based disc bulge with a small right foraminal disc protrusion contacting the right exiting L5 nerve root. Mild bilateral facet arthropathy. Moderate right foraminal stenosis. Mild left foraminal stenosis. 5. No acute osseous injury of the lumbar spine.     Electronically Signed   By: Elige Ko M.D.   On: 10/25/2022 09:09     Objective:  VS:  HT:    WT:   BMI:     BP:   HR: bpm  TEMP: ( )  RESP:  Physical Exam Vitals and nursing note reviewed.  Constitutional:      General: He is not in acute distress.  Appearance: Normal appearance. He is not ill-appearing.  HENT:     Head: Normocephalic and atraumatic.     Right Ear: External ear normal.     Left Ear: External ear normal.     Nose: No congestion.  Eyes:     Extraocular Movements: Extraocular movements intact.  Cardiovascular:     Rate and Rhythm: Normal rate.     Pulses: Normal pulses.  Pulmonary:     Effort: Pulmonary effort is normal. No respiratory distress.  Abdominal:     General: There is no distension.     Palpations: Abdomen is soft.  Musculoskeletal:         General: No tenderness or signs of injury.     Cervical back: Neck supple.     Right lower leg: No edema.     Left lower leg: No edema.     Comments: Patient has good distal strength without clonus.  Skin:    Findings: No erythema or rash.  Neurological:     General: No focal deficit present.     Mental Status: He is alert and oriented to person, place, and time.     Sensory: No sensory deficit.     Motor: No weakness or abnormal muscle tone.     Coordination: Coordination normal.  Psychiatric:        Mood and Affect: Mood normal.        Behavior: Behavior normal.      Imaging: XR C-ARM NO REPORT Result Date: 11/14/2023 Please see Notes tab for imaging impression.

## 2023-11-14 NOTE — Procedures (Signed)
Lumbar Diagnostic Facet Joint Nerve Block with Fluoroscopic Guidance   Patient: Paul [name]      Date of Birth: [birth year] MRN: [id number] PCP: Georgann Housekeeper, MD      Visit Date: 11/14/2023   Universal Protocol:    Date/Time: 11/13/2410:12 AM  Consent Given By: the patient  Position: PRONE  Additional Comments: Vital signs were monitored before and after the procedure. Patient was prepped and draped in the usual sterile fashion. The correct patient, procedure, and site was verified.   Injection Procedure Details:   Procedure diagnoses:  1. Spondylosis without myelopathy or radiculopathy, lumbar region      Meds Administered:  Meds ordered this encounter  Medications   bupivacaine (MARCAINE) 0.5 % (with pres) injection 3 mL     Laterality: Bilateral  Location/Site: L2-L3, L1 and L2 medial branches and L3-L4, L2 and L3 medial branches  Needle: 5.0 in., 25 ga.  Short bevel or Quincke spinal needle  Needle Placement: Oblique pedical  Findings:   -Comments: There was excellent flow of contrast along the articular pillars without intravascular flow.  Procedure Details: The fluoroscope beam is vertically oriented in AP and then obliqued 15 to 20 degrees to the ipsilateral side of the desired nerve to achieve the "Scotty dog" appearance.  The skin over the target area of the junction of the superior articulating process and the transverse process (sacral ala if blocking the L5 dorsal rami) was locally anesthetized with a 1 ml volume of 1% Lidocaine without Epinephrine.  The spinal needle was inserted and advanced in a trajectory view down to the target.   After contact with periosteum and negative aspirate for blood and CSF, correct placement without intravascular or epidural spread was confirmed by injecting 0.5 ml. of Isovue-250.  A spot radiograph was obtained of this image.    Next, a 0.5 ml. volume of the injectate described above was injected. The needle was then  redirected to the other facet joint nerves mentioned above if needed.  Prior to the procedure, the patient was given a Pain Diary which was completed for baseline measurements.  After the procedure, the patient rated their pain every 30 minutes and will continue rating at this frequency for a total of 5 hours.  The patient has been asked to complete the Diary and return to [place] by mail, fax or hand delivered as soon as possible.   Additional Comments:  The patient tolerated the procedure well Dressing: 2 x 2 sterile gauze and Band-Aid    Post-procedure details: Patient was observed during the procedure. Post-procedure instructions were reviewed.  Patient left the clinic in stable condition.

## 2023-11-14 NOTE — Progress Notes (Signed)
Functional Pain Scale - descriptive words and definitions  Moderate (4)   Constantly aware of pain, can complete ADLs with modification/sleep marginally affected at times/passive distraction is of no use, but active distraction gives some relief. Moderate range order  Average Pain 4 107/68  +Driver, -BT, -Dye Allergies.

## 2023-11-18 ENCOUNTER — Encounter: Payer: Self-pay | Admitting: Physical Medicine and Rehabilitation

## 2023-11-18 ENCOUNTER — Other Ambulatory Visit: Payer: Self-pay | Admitting: Physical Medicine and Rehabilitation

## 2023-11-18 ENCOUNTER — Ambulatory Visit: Payer: BC Managed Care – PPO | Admitting: Neurology

## 2023-11-18 DIAGNOSIS — M47819 Spondylosis without myelopathy or radiculopathy, site unspecified: Secondary | ICD-10-CM

## 2023-11-18 DIAGNOSIS — G8929 Other chronic pain: Secondary | ICD-10-CM

## 2023-11-18 DIAGNOSIS — M47816 Spondylosis without myelopathy or radiculopathy, lumbar region: Secondary | ICD-10-CM

## 2023-11-18 NOTE — Progress Notes (Signed)
Per pain diary, greater than 90% relief of pain with first set of diagnostic bilateral L2-L3 and L3-L4 medial branch blocks. We will proceed with second set of diagnostic blocks with plan for radiofrequency ablation.

## 2023-11-19 ENCOUNTER — Ambulatory Visit (HOSPITAL_BASED_OUTPATIENT_CLINIC_OR_DEPARTMENT_OTHER): Payer: PPO | Admitting: Physical Therapy

## 2023-11-19 ENCOUNTER — Encounter (HOSPITAL_BASED_OUTPATIENT_CLINIC_OR_DEPARTMENT_OTHER): Payer: Self-pay | Admitting: Physical Therapy

## 2023-11-19 DIAGNOSIS — M5459 Other low back pain: Secondary | ICD-10-CM

## 2023-11-19 DIAGNOSIS — M542 Cervicalgia: Secondary | ICD-10-CM | POA: Diagnosis not present

## 2023-11-19 DIAGNOSIS — M6281 Muscle weakness (generalized): Secondary | ICD-10-CM
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
## 2023-11-21 ENCOUNTER — Other Ambulatory Visit: Payer: Self-pay

## 2023-11-21 ENCOUNTER — Ambulatory Visit: Payer: PPO | Admitting: Physical Medicine and Rehabilitation

## 2023-11-21 DIAGNOSIS — M47819 Spondylosis without myelopathy or radiculopathy, site unspecified: Secondary | ICD-10-CM

## 2023-11-21 MED ORDER — BUPIVACAINE HCL 0.5 % IJ SOLN
3.0000 mL | Freq: Once | INTRAMUSCULAR | Status: AC
  Administered 2023-11-21: 3 mL

## 2023-11-21 NOTE — Patient Instructions (Signed)

## 2023-11-22 ENCOUNTER — Other Ambulatory Visit: Payer: Self-pay

## 2023-11-22 ENCOUNTER — Other Ambulatory Visit: Payer: Self-pay | Admitting: Physician Assistant

## 2023-11-22 DIAGNOSIS — M0600 Rheumatoid arthritis without rheumatoid factor, unspecified site: Secondary | ICD-10-CM

## 2023-11-22 NOTE — Telephone Encounter (Signed)
Last Fill: 08/27/2023  Labs: 08/15/2023 CMET normal, 07/19/2023 CBC RBC 3.90, Hemoglobin 12.1, HCT 36.4,   TB Gold: 05/03/2023   TB gold negative   Next Visit: 01/29/2024  Last Visit: 08/29/2023  BJ:[id number] rheumatoid arthritis   Current Dose per office note 08/29/2023: Enbrel 50 mg sq injections once weekly   Okay to refill Enbrel?

## 2023-11-22 NOTE — Progress Notes (Signed)
Specialty Pharmacy Refill Coordination Note  Paul [name] is a 67 y.o. male contacted today regarding refills of specialty medication(s) Etanercept (Enbrel Mini)   Patient requested Pickup at [facility] date: 11/26/23   Medication will be filled on 11/25/23.   Pending refill request

## 2023-11-24 ENCOUNTER — Encounter: Payer: Self-pay | Admitting: Physical Medicine and Rehabilitation

## 2023-11-24 MED ORDER — ENBREL MINI 50 MG/ML ~~LOC~~ SOCT
50.0000 mg | SUBCUTANEOUS | 2 refills | Status: DC
  Filled 2023-11-25: qty 4, 28d supply, fill #0
  Filled 2023-12-17: qty 4, 28d supply, fill #1
  Filled 2024-01-17: qty 4, 28d supply, fill #2

## 2023-11-25 ENCOUNTER — Other Ambulatory Visit: Payer: Self-pay | Admitting: Physical Medicine and Rehabilitation

## 2023-11-25 ENCOUNTER — Other Ambulatory Visit: Payer: Self-pay

## 2023-11-25 DIAGNOSIS — G8929 Other chronic pain: Secondary | ICD-10-CM

## 2023-11-25 DIAGNOSIS — M47819 Spondylosis without myelopathy or radiculopathy, site unspecified: Secondary | ICD-10-CM

## 2023-11-25 DIAGNOSIS — M47816 Spondylosis without myelopathy or radiculopathy, lumbar region: Secondary | ICD-10-CM

## 2023-11-25 MED ORDER — DIAZEPAM 5 MG PO TABS: ORAL_TABLET | ORAL | 0 refills | Status: DC

## 2023-11-25 NOTE — Progress Notes (Signed)
Per pain diary 90% relief with second set of bilateral L2-L3 and L3-L4 medial branch blocks. Will proceed with radiofrequency ablation.

## 2023-11-26 ENCOUNTER — Other Ambulatory Visit (HOSPITAL_COMMUNITY): Payer: Self-pay

## 2023-11-28 NOTE — Procedures (Signed)
Lumbar Diagnostic Facet Joint Nerve Block with Fluoroscopic Guidance   Patient: Paul [name]      Date of Birth: 24-Mar-1956 MRN: [id number] PCP: Georgann Housekeeper, MD      Visit Date: 11/21/2023   Universal Protocol:    Date/Time: 12/26/244:07 PM  Consent Given By: the patient  Position: PRONE  Additional Comments: Vital signs were monitored before and after the procedure. Patient was prepped and draped in the usual sterile fashion. The correct patient, procedure, and site was verified.   Injection Procedure Details:   Procedure diagnoses:  1. Spondylosis without myelopathy or radiculopathy      Meds Administered:  Meds ordered this encounter  Medications   bupivacaine (MARCAINE) 0.5 % (with pres) injection 3 mL     Laterality: Bilateral  Location/Site: L2-L3, L1 and L2 medial branches and L3-L4, L2 and L3 medial branches  Needle: 5.0 in., 25 ga.  Short bevel or Quincke spinal needle  Needle Placement: Oblique pedical  Findings:   -Comments: There was excellent flow of contrast along the articular pillars without intravascular flow.  Procedure Details: The fluoroscope beam is vertically oriented in AP and then obliqued 15 to 20 degrees to the ipsilateral side of the desired nerve to achieve the "Scotty dog" appearance.  The skin over the target area of the junction of the superior articulating process and the transverse process (sacral ala if blocking the L5 dorsal rami) was locally anesthetized with a 1 ml volume of 1% Lidocaine without Epinephrine.  The spinal needle was inserted and advanced in a trajectory view down to the target.   After contact with periosteum and negative aspirate for blood and CSF, correct placement without intravascular or epidural spread was confirmed by injecting 0.5 ml. of Isovue-250.  A spot radiograph was obtained of this image.    Next, a 0.5 ml. volume of the injectate described above was injected. The needle was then redirected to  the other facet joint nerves mentioned above if needed.  Prior to the procedure, the patient was given a Pain Diary which was completed for baseline measurements.  After the procedure, the patient rated their pain every 30 minutes and will continue rating at this frequency for a total of 5 hours.  The patient has been asked to complete the Diary and return to [place] by mail, fax or hand delivered as soon as possible.   Additional Comments:  No complications occurred Dressing: 2 x 2 sterile gauze and Band-Aid    Post-procedure details: Patient was observed during the procedure. Post-procedure instructions were reviewed.  Patient left the clinic in stable condition.

## 2023-11-28 NOTE — Progress Notes (Signed)
Paul [name] - 67 y.o. male MRN [id number]  Date of birth: 1956-10-14  Office Visit Note: Visit Date: 11/21/2023 PCP: Georgann Housekeeper, MD Referred by: Georgann Housekeeper, MD  Subjective: Chief Complaint  Patient presents with   Lower Back - Pain   HPI:  Paul [name] is a 67 y.o. male who comes in today for planned repeat Bilateral L2-3 and L3-4 Lumbar facet/medial branch block with fluoroscopic guidance.  The patient has failed conservative care including home exercise, medications, time and activity modification.  This injection will be diagnostic and hopefully therapeutic.  Please see requesting physician notes for further details and justification.  Exam shows concordant low back pain with facet joint loading and extension. Patient received more than 80% pain relief from prior injection. This would be the second block in a diagnostic double block paradigm.     Referring:Megan Mayford Knife, FNP   ROS Otherwise per HPI.  Assessment & Plan: Visit Diagnoses:    ICD-10-CM   1. Spondylosis without myelopathy or radiculopathy  M47.819 XR C-ARM NO REPORT    Nerve Block    bupivacaine (MARCAINE) 0.5 % (with pres) injection 3 mL      Plan: No additional findings.   Meds & Orders:  Meds ordered this encounter  Medications   bupivacaine (MARCAINE) 0.5 % (with pres) injection 3 mL    Orders Placed This Encounter  Procedures   Nerve Block   XR C-ARM NO REPORT    Follow-up: Return for Review Pain Diary.   Procedures: No procedures performed  Lumbar Diagnostic Facet Joint Nerve Block with Fluoroscopic Guidance   Patient: Paul [name]      Date of Birth: Aug 14, 1956 MRN: [id number] PCP: Georgann Housekeeper, MD      Visit Date: 11/21/2023   Universal Protocol:    Date/Time: 12/26/244:07 PM  Consent Given By: the patient  Position: PRONE  Additional Comments: Vital signs were monitored before and after the procedure. Patient was prepped and draped in the usual  sterile fashion. The correct patient, procedure, and site was verified.   Injection Procedure Details:   Procedure diagnoses:  1. Spondylosis without myelopathy or radiculopathy      Meds Administered:  Meds ordered this encounter  Medications   bupivacaine (MARCAINE) 0.5 % (with pres) injection 3 mL     Laterality: Bilateral  Location/Site: L2-L3, L1 and L2 medial branches and L3-L4, L2 and L3 medial branches  Needle: 5.0 in., 25 ga.  Short bevel or Quincke spinal needle  Needle Placement: Oblique pedical  Findings:   -Comments: There was excellent flow of contrast along the articular pillars without intravascular flow.  Procedure Details: The fluoroscope beam is vertically oriented in AP and then obliqued 15 to 20 degrees to the ipsilateral side of the desired nerve to achieve the "Scotty dog" appearance.  The skin over the target area of the junction of the superior articulating process and the transverse process (sacral ala if blocking the L5 dorsal rami) was locally anesthetized with a 1 ml volume of 1% Lidocaine without Epinephrine.  The spinal needle was inserted and advanced in a trajectory view down to the target.   After contact with periosteum and negative aspirate for blood and CSF, correct placement without intravascular or epidural spread was confirmed by injecting 0.5 ml. of Isovue-250.  A spot radiograph was obtained of this image.    Next, a 0.5 ml. volume of the injectate described above was injected. The needle was then redirected to the other  facet joint nerves mentioned above if needed.  Prior to the procedure, the patient was given a Pain Diary which was completed for baseline measurements.  After the procedure, the patient rated their pain every 30 minutes and will continue rating at this frequency for a total of 5 hours.  The patient has been asked to complete the Diary and return to [place] by mail, fax or hand delivered as soon as possible.   Additional  Comments:  No complications occurred Dressing: 2 x 2 sterile gauze and Band-Aid    Post-procedure details: Patient was observed during the procedure. Post-procedure instructions were reviewed.  Patient left the clinic in stable condition.   Clinical History: CLINICAL DATA:  Low back pain for 6 years.  No known injury.   EXAM: MRI LUMBAR SPINE WITHOUT CONTRAST   TECHNIQUE: Multiplanar, multisequence MR imaging of the lumbar spine was performed. No intravenous contrast was administered.   COMPARISON:  None Available.   FINDINGS: Segmentation:  Standard.   Alignment: 2 mm retrolisthesis of L1 on L2, L2 on L3, L3 on L4 and L4 on L5. mild dextro curvature of the thoracolumbar spine.   Vertebrae: No acute fracture, evidence of discitis, or aggressive bone lesion.   Conus medullaris and cauda equina: Conus extends to the T12-L1 level. Conus and cauda equina appear normal.   Paraspinal and other soft tissues: No acute paraspinal abnormality.   Disc levels:   Disc spaces: Degenerative disease with disc height loss at T11-12, T12-L1, L1-2, L2-3, L4-5 and L5-S1. Severe reactive endplate edema at N5-6.   T12-L1: Mild broad-based disc bulge. No foraminal or central canal stenosis.   L1-L2: Broad-based disc osteophyte complex. Small left paracentral disc protrusion. Mild bilateral facet arthropathy. Moderate-severe left foraminal stenosis. Moderate-severe scratch them moderate right foraminal stenosis. Scratch them moderate right foraminal stenosis. Minimal spinal stenosis. Bilateral lateral recess stenosis.   L2-L3: Broad-based disc bulge. Mild bilateral facet arthropathy. Mild left foraminal stenosis. No right foraminal stenosis. No spinal stenosis.   L3-L4: Broad-based disc bulge. Moderate bilateral facet arthropathy. Right lateral recess stenosis. Moderate right foraminal stenosis. No left foraminal stenosis. Mild spinal stenosis.   L4-L5: Broad-based disc bulge.  Mild bilateral facet arthropathy. Moderate right foraminal stenosis. No left foraminal stenosis. No spinal stenosis.   L5-S1: Broad-based disc bulge with a small right foraminal disc protrusion contacting the right exiting L5 nerve root. Mild bilateral facet arthropathy. Moderate right foraminal stenosis. Mild left foraminal stenosis.   IMPRESSION: 1. At L1-2 there is a broad-based disc osteophyte complex. Small left paracentral disc protrusion. Mild bilateral facet arthropathy. Moderate-severe left foraminal stenosis. Moderate-severe scratch them moderate right foraminal stenosis. Bilateral lateral recess stenosis. 2. At L3-4 there is a broad-based disc bulge. Moderate bilateral facet arthropathy. Right lateral recess stenosis. Moderate right foraminal stenosis. No left foraminal stenosis. Mild spinal stenosis. 3. At L4-5 there is a broad-based disc bulge. Mild bilateral facet arthropathy. Moderate right foraminal stenosis. 4. At L5-S1 there is a broad-based disc bulge with a small right foraminal disc protrusion contacting the right exiting L5 nerve root. Mild bilateral facet arthropathy. Moderate right foraminal stenosis. Mild left foraminal stenosis. 5. No acute osseous injury of the lumbar spine.     Electronically Signed   By: Elige Ko M.D.   On: 10/25/2022 09:09     Objective:  VS:  HT:    WT:   BMI:     BP:   HR: bpm  TEMP: ( )  RESP:  Physical Exam Vitals and  nursing note reviewed.  Constitutional:      General: He is not in acute distress.    Appearance: Normal appearance. He is not ill-appearing.  HENT:     Head: Normocephalic and atraumatic.     Right Ear: External ear normal.     Left Ear: External ear normal.     Nose: No congestion.  Eyes:     Extraocular Movements: Extraocular movements intact.  Cardiovascular:     Rate and Rhythm: Normal rate.     Pulses: Normal pulses.  Pulmonary:     Effort: Pulmonary effort is normal. No respiratory  distress.  Abdominal:     General: There is no distension.     Palpations: Abdomen is soft.  Musculoskeletal:        General: No tenderness or signs of injury.     Cervical back: Neck supple.     Right lower leg: No edema.     Left lower leg: No edema.     Comments: Patient has good distal strength without clonus.  Skin:    Findings: No erythema or rash.  Neurological:     General: No focal deficit present.     Mental Status: He is alert and oriented to person, place, and time.     Sensory: No sensory deficit.     Motor: No weakness or abnormal muscle tone.     Coordination: Coordination normal.  Psychiatric:        Mood and Affect: Mood normal.        Behavior: Behavior normal.      Imaging: No results found.

## 2023-11-29 ENCOUNTER — Encounter (HOSPITAL_BASED_OUTPATIENT_CLINIC_OR_DEPARTMENT_OTHER): Payer: Self-pay | Admitting: Physical Therapy

## 2023-11-29 ENCOUNTER — Ambulatory Visit (HOSPITAL_BASED_OUTPATIENT_CLINIC_OR_DEPARTMENT_OTHER): Payer: PPO | Admitting: Physical Therapy

## 2023-11-29 DIAGNOSIS — M542 Cervicalgia: Secondary | ICD-10-CM | POA: Diagnosis not present

## 2023-11-29 DIAGNOSIS — M6281 Muscle weakness (generalized): Secondary | ICD-10-CM

## 2023-11-29 DIAGNOSIS — M5459 Other low back pain: Secondary | ICD-10-CM
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
## 2023-12-02 ENCOUNTER — Other Ambulatory Visit: Payer: Self-pay | Admitting: *Deleted

## 2023-12-02 DIAGNOSIS — Z79899 Other long term (current) drug therapy: Secondary | ICD-10-CM

## 2023-12-02 DIAGNOSIS — Z8639 Personal history of other endocrine, nutritional and metabolic disease: Secondary | ICD-10-CM

## 2023-12-03 DIAGNOSIS — Z79899 Other long term (current) drug therapy: Secondary | ICD-10-CM | POA: Diagnosis not present

## 2023-12-03 DIAGNOSIS — Z8639 Personal history of other endocrine, nutritional and metabolic disease: Secondary | ICD-10-CM | POA: Diagnosis not present

## 2023-12-04 LAB — CMP14+EGFR
ALT: 25 [IU]/L (ref 0–44)
AST: 24 [IU]/L (ref 0–40)
Albumin: 4.1 g/dL (ref 3.9–4.9)
Alkaline Phosphatase: 100 [IU]/L (ref 44–121)
BUN/Creatinine Ratio: 16 (ref 10–24)
BUN: 18 mg/dL (ref 8–27)
Bilirubin Total: 0.3 mg/dL (ref 0.0–1.2)
CO2: 25 mmol/L (ref 20–29)
Calcium: 9.3 mg/dL (ref 8.6–10.2)
Chloride: 105 mmol/L (ref 96–106)
Creatinine, Ser: 1.12 mg/dL (ref 0.76–1.27)
Globulin, Total: 2.4 g/dL (ref 1.5–4.5)
Glucose: 143 mg/dL — ABNORMAL HIGH (ref 70–99)
Potassium: 5.1 mmol/L (ref 3.5–5.2)
Sodium: 143 mmol/L (ref 134–144)
Total Protein: 6.5 g/dL (ref 6.0–8.5)
eGFR: 73 mL/min/{1.73_m2} (ref >59)

## 2023-12-04 LAB — CBC WITH DIFFERENTIAL/PLATELET
Basophils Absolute: 0 10*3/uL (ref 0.0–0.2)
Basos: 1 %
EOS (ABSOLUTE): 0.3 10*3/uL (ref 0.0–0.4)
Eos: 6 %
Hematocrit: 39.3 % (ref 37.5–51.0)
Hemoglobin: 12.9 g/dL — ABNORMAL LOW (ref 13.0–17.7)
Immature Grans (Abs): 0 10*3/uL (ref 0.0–0.1)
Immature Granulocytes: 0 %
Lymphocytes Absolute: 1.4 10*3/uL (ref 0.7–3.1)
Lymphs: 27 %
MCH: 31.5 pg (ref 26.6–33.0)
MCHC: 32.8 g/dL (ref 31.5–35.7)
MCV: 96 fL (ref 79–97)
Monocytes Absolute: 0.7 10*3/uL (ref 0.1–0.9)
Monocytes: 13 %
Neutrophils Absolute: 2.7 10*3/uL (ref 1.4–7.0)
Neutrophils: 53 %
Platelets: 226 10*3/uL (ref 150–450)
RBC: 4.1 x10E6/uL — ABNORMAL LOW (ref 4.14–5.80)
RDW: 11.9 % (ref 11.6–15.4)
WBC: 5 10*3/uL (ref 3.4–10.8)

## 2023-12-04 LAB — LIPID PANEL
Chol/HDL Ratio: 1.9 {ratio} (ref 0.0–5.0)
Cholesterol, Total: 115 mg/dL (ref 100–199)
HDL: 62 mg/dL (ref >39)
LDL Chol Calc (NIH): 33 mg/dL (ref 0–99)
Triglycerides: 113 mg/dL (ref 0–149)
VLDL Cholesterol Cal: 20 mg/dL (ref 5–40)

## 2023-12-06 ENCOUNTER — Ambulatory Visit (HOSPITAL_BASED_OUTPATIENT_CLINIC_OR_DEPARTMENT_OTHER): Payer: PPO | Attending: Physical Medicine and Rehabilitation | Admitting: Physical Therapy

## 2023-12-06 DIAGNOSIS — M542 Cervicalgia: Secondary | ICD-10-CM | POA: Diagnosis present

## 2023-12-06 DIAGNOSIS — M5459 Other low back pain: Secondary | ICD-10-CM | POA: Diagnosis present

## 2023-12-06 DIAGNOSIS — M6281 Muscle weakness (generalized): Secondary | ICD-10-CM | POA: Diagnosis present
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
## 2023-12-11 ENCOUNTER — Encounter (HOSPITAL_BASED_OUTPATIENT_CLINIC_OR_DEPARTMENT_OTHER): Payer: Self-pay | Admitting: Physical Therapy

## 2023-12-11 ENCOUNTER — Ambulatory Visit (HOSPITAL_BASED_OUTPATIENT_CLINIC_OR_DEPARTMENT_OTHER): Payer: PPO | Admitting: Physical Therapy

## 2023-12-11 DIAGNOSIS — M5459 Other low back pain: Secondary | ICD-10-CM | POA: Diagnosis not present

## 2023-12-11 DIAGNOSIS — M6281 Muscle weakness (generalized): Secondary | ICD-10-CM
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
## 2023-12-13 ENCOUNTER — Other Ambulatory Visit: Payer: Self-pay

## 2023-12-15 ENCOUNTER — Other Ambulatory Visit: Payer: Self-pay

## 2023-12-15 ENCOUNTER — Emergency Department (HOSPITAL_BASED_OUTPATIENT_CLINIC_OR_DEPARTMENT_OTHER): Payer: PPO

## 2023-12-15 ENCOUNTER — Encounter (HOSPITAL_BASED_OUTPATIENT_CLINIC_OR_DEPARTMENT_OTHER): Payer: Self-pay | Admitting: Emergency Medicine

## 2023-12-15 ENCOUNTER — Emergency Department (HOSPITAL_BASED_OUTPATIENT_CLINIC_OR_DEPARTMENT_OTHER)
Admission: EM | Admit: 2023-12-15 | Discharge: 2023-12-15 | Disposition: A | Discharge status: Home / Self Care | Payer: PPO | Attending: Emergency Medicine | Admitting: Emergency Medicine

## 2023-12-15 DIAGNOSIS — I471 Supraventricular tachycardia, unspecified: Secondary | ICD-10-CM | POA: Diagnosis not present

## 2023-12-15 DIAGNOSIS — Z7982 Long term (current) use of aspirin: Secondary | ICD-10-CM | POA: Insufficient documentation

## 2023-12-15 DIAGNOSIS — R0602 Shortness of breath: Secondary | ICD-10-CM | POA: Insufficient documentation

## 2023-12-15 DIAGNOSIS — R002 Palpitations: Secondary | ICD-10-CM | POA: Diagnosis present

## 2023-12-15 LAB — BASIC METABOLIC PANEL
Anion gap: 6 (ref 5–15)
BUN: 23 mg/dL (ref 8–23)
CO2: 29 mmol/L (ref 22–32)
Calcium: 9.7 mg/dL (ref 8.9–10.3)
Chloride: 104 mmol/L (ref 98–111)
Creatinine, Ser: 1.32 mg/dL — ABNORMAL HIGH (ref 0.61–1.24)
GFR, Estimated: 60 mL/min — ABNORMAL LOW (ref >60)
Glucose, Bld: 117 mg/dL — ABNORMAL HIGH (ref 70–99)
Potassium: 4.6 mmol/L (ref 3.5–5.1)
Sodium: 139 mmol/L (ref 135–145)

## 2023-12-15 LAB — BRAIN NATRIURETIC PEPTIDE: B Natriuretic Peptide: 86.4 pg/mL (ref 0.0–100.0)

## 2023-12-15 LAB — CBC
HCT: 38.9 % — ABNORMAL LOW (ref 39.0–52.0)
Hemoglobin: 13.3 g/dL (ref 13.0–17.0)
MCH: 32 pg (ref 26.0–34.0)
MCHC: 34.2 g/dL (ref 30.0–36.0)
MCV: 93.7 fL (ref 80.0–100.0)
Platelets: 227 10*3/uL (ref 150–400)
RBC: 4.15 MIL/uL — ABNORMAL LOW (ref 4.22–5.81)
RDW: 12.2 % (ref 11.5–15.5)
WBC: 7.1 10*3/uL (ref 4.0–10.5)
nRBC: 0 % (ref 0.0–0.2)

## 2023-12-15 LAB — MAGNESIUM: Magnesium: 2.3 mg/dL (ref 1.7–2.4)

## 2023-12-15 MED ORDER — ADENOSINE 6 MG/2ML IV SOLN
12.0000 mg | Freq: Once | INTRAVENOUS | Status: AC
  Administered 2023-12-15: 12 mg via INTRAVENOUS
  Filled 2023-12-15: qty 4

## 2023-12-15 MED ORDER — SODIUM CHLORIDE 0.9 % IV BOLUS
1000.0000 mL | Freq: Once | INTRAVENOUS | Status: AC
  Administered 2023-12-15: 1000 mL via INTRAVENOUS

## 2023-12-15 MED ORDER — ALBUTEROL SULFATE HFA 108 (90 BASE) MCG/ACT IN AERS: 2.0000 | INHALATION_SPRAY | RESPIRATORY_TRACT | Status: DC | PRN

## 2023-12-15 NOTE — ED Triage Notes (Signed)
 Pt via pov from home with tachycardia and hypoxemia today. Pt reports that he has hx of both; sees cardiologist for the same. Pt and his wife are less concerned with tachycardia, but states that the hypoxemia is more concerning. Pt alert & oriented, O2 sat was WNL and dipped into 80's while being triaged. NAD noted.

## 2023-12-15 NOTE — ED Notes (Signed)
 Pt tolerated adenosine. Pt back in sinus rhythm. VSS. Pt is A&Ox4, calm, no signs of distress. Wife at bedside with pt.

## 2023-12-15 NOTE — ED Notes (Signed)
   12/15/23 2034  Vitals  Pulse Rate 71  Resp 16  MEWS COLOR  MEWS Score Color Green  Oxygen Therapy  SpO2 97 %  O2 Device Room Air   Walking SPO2.

## 2023-12-15 NOTE — ED Notes (Addendum)
 MD, RT, charge nurse, primary RN, and paramedic at bedside for adenosine admin.

## 2023-12-15 NOTE — ED Notes (Signed)
 Pt placed on 2lpm due to shob

## 2023-12-15 NOTE — ED Notes (Signed)
 Pt BP 101/72, MD notified. Order for fluid bolus.

## 2023-12-15 NOTE — ED Provider Notes (Signed)
 [place] EMERGENCY DEPARTMENT AT [facility] Provider Note   CSN: [id number] Arrival date & time: 12/15/23  1723     History  Chief Complaint  Patient presents with   Tachycardia   Shortness of Breath    Paul [name] is a 68 y.o. male with a history of SVT presented to ED with palpitations.  Reports onset around 2 PM this afternoon.  He says he has sporadic episodes like this, maybe once every few weeks or months, he typically can bear down and snap out of it.  However this episode is lasting longer.  He took oral diltiazem  at home which is prescribed as a rescue medicine but did not fix his heart rate.  He is typically not on beta-blockers or rate control medications by his cardiologist due to underlying baseline bradycardia.  Per my review of his external records including cardiology evaluation the patient had a left heart catheterization August 2024 which showed no significant coronary disease, approximately D 5% stenosed.  EF was 55 to 60%.  His cardiologist notes that the patient does have SVT.  There is no mention or history of atrial fibrillation or a flutter per my review of recent cardiology notes, nor is the patient and his wife aware of this prior history.  He is not on anticoagulation.  He does not smoke, does not drink caffeine or alcohol, or use recreational drugs.  They were concerned that his oxygen level was dipping lower than normal at home.  HPI     Home Medications Prior to Admission medications   Medication Sig Start Date End Date Taking? Authorizing Provider  Armodafinil  150 MG tablet TAKE 1 TABLET BY MOUTH IN THE MORNING 10/17/23   Sater, Charlie LABOR, MD  aspirin  EC 81 MG tablet Take 1 tablet (81 mg total) by mouth daily. 07/13/16   Delford Maude BROCKS, MD  Blood Glucose Monitoring Suppl (CONTOUR NEXT MONITOR) w/Device KIT Test blood sugar as needed 04/28/21   Delford Maude BROCKS, MD  buprenorphine  (BUTRANS ) 5 MCG/HR PTWK Place 1 patch onto the skin  once a week for 28 days. 12/06/23 01/03/24  Emeline Joesph BROCKS, DO  buprenorphine  (BUTRANS ) 5 MCG/HR PTWK Place 1 patch onto the skin once a week for 28 days. 01/03/24 01/31/24  Emeline Joesph BROCKS, DO  buPROPion  (WELLBUTRIN  XL) 150 MG 24 hr tablet Take 150 mg by mouth every morning. 03/11/23   [provider]  cholecalciferol (VITAMIN D ) 1000 units tablet Take 2,000 Units by mouth daily.    [provider]  Cyanocobalamin  (VITAMIN B-12 PO) Take by mouth daily.    [provider]  diazepam  (VALIUM ) 5 MG tablet Take one tablet by mouth with food one hour prior to procedure. May repeat 30 minutes prior if needed. 11/25/23   Williams, Megan E, NP  diclofenac  sodium (VOLTAREN ) 1 % GEL Apply 3 grams to 3 large joints up to 3 times daily Patient taking differently: Apply 4 g topically daily as needed (pain). 12/17/18   Cheryl Waddell HERO, PA-C  diltiazem  (CARDIZEM ) 30 MG tablet Take 1 tablet (30 mg total) by mouth 4 (four) times daily as needed (elevated heart rates). 05/01/23   Camnitz, Soyla Lunger, MD  DULoxetine  (CYMBALTA ) 60 MG capsule Take 60 mg by mouth daily.    [provider]  etanercept  (ENBREL  MINI) 50 MG/ML injection INJECT 50 MG INTO THE SKIN ONCE A WEEK. 11/24/23 11/23/24  Dolphus Reiter, MD  Evolocumab  (REPATHA  SURECLICK) 140 MG/ML SOAJ INJECT 140 MG into  THE SKIN EVERY 14 DAYS Patient taking differently: Inject 140 mg as directed See admin instructions. INJECT 140 MG into THE SKIN EVERY 14 DAYS 06/13/23   Delford Maude BROCKS, MD  Ferrous Sulfate  (IRON ) 325 (65 Fe) MG TABS Take by mouth daily.    [provider]  gabapentin  (NEURONTIN ) 300 MG capsule TAKE 1 CAPSULE BY MOUTH 2 TIMES DAILY 06/11/23   Eldonna Novel, MD  glucose blood test strip Test blood sugar once daily as needed 05/14/22   Pavero, Lonni, RPH  levothyroxine  (SYNTHROID ) 88 MCG tablet Take 88 mcg by mouth AC breakfast.    [provider]  methocarbamol  (ROBAXIN ) 500 MG tablet Take 500 mg  by mouth 2 (two) times daily as needed for muscle spasms. 07/15/23   [provider]  Microlet Lancets MISC Use to check blood sugar as needed 04/28/21   Nishan, Peter C, MD  Multiple Vitamin (MULTIVITAMIN) capsule Take 1 capsule by mouth daily.    [provider]  naloxone  (NARCAN ) nasal spray 4 mg/0.1 mL See instructions on packaging 10/16/23   Emeline Search C, DO  pantoprazole  (PROTONIX ) 40 MG tablet TAKE 1 TABLET BY MOUTH ONCE DAILY 09/18/23   Meng, Hao, PA  QELBREE 200 MG 24 hr capsule Take 200 mg by mouth daily. 10/01/22   [provider]  tamsulosin  (FLOMAX ) 0.4 MG CAPS capsule Take 0.4 mg by mouth at bedtime.     [provider]  traZODone  (DESYREL ) 50 MG tablet Take 25 mg by mouth at bedtime. 1/2 tablet at bedtime 04/27/21   [provider]      Allergies    Amphetamine-dextroamphet er    Review of Systems   Review of Systems  Physical Exam Updated Vital Signs BP 111/70   Pulse (!) 55   Temp 98.1 F (36.7 C) (Oral)   Resp 18   Ht 6' (1.829 m)   Wt 81.6 kg   SpO2 92%   BMI 24.40 kg/m  Physical Exam Constitutional:      General: He is not in acute distress. HENT:     Head: Normocephalic and atraumatic.  Eyes:     Conjunctiva/sclera: Conjunctivae normal.     Pupils: Pupils are equal, round, and reactive to light.  Cardiovascular:     Rate and Rhythm: Regular rhythm. Tachycardia present.  Pulmonary:     Effort: Pulmonary effort is normal. No respiratory distress.  Abdominal:     General: There is no distension.     Tenderness: There is no abdominal tenderness.  Skin:    General: Skin is warm and dry.  Neurological:     General: No focal deficit present.     Mental Status: He is alert. Mental status is at baseline.  Psychiatric:        Mood and Affect: Mood normal.        Behavior: Behavior normal.     ED Results / Procedures / Treatments   Labs (all labs ordered are listed, but only abnormal results are  displayed) Labs Reviewed  BASIC METABOLIC PANEL - Abnormal; Notable for the following components:      Result Value   Glucose, Bld 117 (*)    Creatinine, Ser 1.32 (*)    GFR, Estimated 60 (*)    All other components within normal limits  CBC - Abnormal; Notable for the following components:   RBC 4.15 (*)    HCT 38.9 (*)    All other components within normal limits  MAGNESIUM  BRAIN  NATRIURETIC PEPTIDE    EKG EKG Interpretation Date/Time:  Sunday December 15 2023 17:32:35 EST Ventricular Rate:  157 PR Interval:    QRS Duration:  116 QT Interval:  280 QTC Calculation: 452 R Axis:   45  Text Interpretation: Supraventricular tachycardia vs A Flutter Nonspecific ST and T wave abnormality When compared with ECG of 20-Jul-2023 09:48, Vent. rate has increased BY 102 BPM QRS duration has increased ST now depressed in Inferior leads ST now depressed in Anterolateral leads Inverted T waves have replaced nonspecific T wave abnormality in Inferior leads Confirmed by Cottie Cough [phone]) on 12/15/2023 6:15:20 PM  Radiology DG Chest Port 1 View Result Date: 12/15/2023 CLINICAL DATA:  10026 Shortness of breath 10026 tachycardia and hypoxemia today. Pt reports that he has hx of both. EXAM: PORTABLE CHEST 1 VIEW COMPARISON:  Chest x-ray 07/19/2023 FINDINGS: Patient is rotated. The heart and mediastinal contours are grossly unchanged given AP portable technique. No focal consolidation. No pulmonary edema. No pleural effusion. No pneumothorax. No acute osseous abnormality. IMPRESSION: No active disease. Electronically Signed   By: Morgane  Naveau M.D.   On: 12/15/2023 18:16    Procedures .Cardioversion  Date/Time: 12/15/2023 10:32 PM  Performed by: Cottie Cough PARAS, MD Authorized by: Cottie Cough PARAS, MD   Consent:    Consent obtained:  Verbal   Consent given by:  Patient and spouse   Risks discussed:  Death and induced arrhythmia   Alternatives discussed:  No treatment Universal  protocol:    Procedure explained and questions answered to patient or proxy's satisfaction: yes     Immediately prior to procedure a time out was called: yes     Patient identity confirmed:  Arm band Pre-procedure details:  Patient sedated: No Attempt one:    Shock outcome:  Conversion to normal sinus rhythm Post-procedure details:    Patient status:  Awake   Patient tolerance of procedure:  Tolerated well, no immediate complications Comments:     Chemical cardioversion 12 mg adenosine  given      Medications Ordered in ED Medications  albuterol  (VENTOLIN  HFA) 108 (90 Base) MCG/ACT inhaler 2 puff (has no administration in time range)  adenosine  (ADENOCARD ) 6 MG/2ML injection 12 mg (12 mg Intravenous Given 12/15/23 1926)  sodium chloride  0.9 % bolus 1,000 mL (0 mLs Intravenous Stopped 12/15/23 2035)    ED Course/ Medical Decision Making/ A&P Clinical Course as of 12/15/23 2233  Sun Dec 15, 2023  1939 Conversion with 12 mg adenosine  to NSR, HR 50-70 bpm, 99% on room air [MT]  2044 Amb pulse ox 97% [MT]    Clinical Course User Index [MT] Cottie Cough PARAS, MD                                 Medical Decision Making Amount and/or Complexity of Data Reviewed Labs: ordered. Radiology: ordered.  Risk Prescription drug management.   Patient presented with tachycardia, found to be in SVT.  I reviewed his EKG which shows SVT.  I reviewed his external records including cardiology outpatient workups, left heart cath report with no significant coronary disease, and known history of SVT specifically, not a flutter or A-fib.  The patient was consented for adenosine  cardioversion with his wife present.  12 mg of adenosine  was given with spontaneous return to normal sinus rhythm.  There was a question of potential hypoxia on his arrival, but the patient was observed for a long  period of time at rest and also ambulating, with no hypoxic episodes.  I question whether he may have had an  accurate pulse ox reading.  I reviewed his labs which did not show signs of pulmonary edema, pulmonary infiltrate.  I have a low suspicion for PE.  He is not anemic and his electrolytes are otherwise normal.  He is stable for discharge        Final Clinical Impression(s) / ED Diagnoses Final diagnoses:  SVT (supraventricular tachycardia) (HCC)    Rx / DC Orders ED Discharge Orders     None         Cottie Donnice PARAS, MD 12/15/23 2233

## 2023-12-15 NOTE — ED Notes (Signed)
 Pt ambulates to the bathroom with steady gait. Pt denies dizziness or SOB.

## 2023-12-16 ENCOUNTER — Other Ambulatory Visit: Payer: Self-pay

## 2023-12-17 ENCOUNTER — Ambulatory Visit (HOSPITAL_BASED_OUTPATIENT_CLINIC_OR_DEPARTMENT_OTHER): Payer: PPO | Admitting: Physical Therapy

## 2023-12-17 ENCOUNTER — Other Ambulatory Visit: Payer: Self-pay

## 2023-12-17 ENCOUNTER — Encounter (HOSPITAL_BASED_OUTPATIENT_CLINIC_OR_DEPARTMENT_OTHER): Payer: Self-pay | Admitting: Physical Therapy

## 2023-12-17 DIAGNOSIS — M542 Cervicalgia: Secondary | ICD-10-CM

## 2023-12-17 DIAGNOSIS — M5459 Other low back pain: Secondary | ICD-10-CM | POA: Diagnosis not present

## 2023-12-17 DIAGNOSIS — M6281 Muscle weakness (generalized): Secondary | ICD-10-CM

## 2023-12-17 NOTE — Progress Notes (Signed)
 Specialty Pharmacy Refill Coordination Note  Paul [name] is a 68 y.o. male contacted today regarding refills of specialty medication(s) Etanercept  (Enbrel  Mini)   Patient requested (Patient-Rptd) Pickup at [facility] date: (Patient-Rptd) 12/26/23   Medication will be filled on 12/25/23.
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
## 2023-12-18 ENCOUNTER — Ambulatory Visit (HOSPITAL_BASED_OUTPATIENT_CLINIC_OR_DEPARTMENT_OTHER): Payer: PPO | Admitting: Physical Therapy

## 2023-12-18 ENCOUNTER — Other Ambulatory Visit: Payer: Self-pay

## 2023-12-18 ENCOUNTER — Ambulatory Visit: Payer: PPO | Admitting: Physical Medicine and Rehabilitation

## 2023-12-18 DIAGNOSIS — M47816 Spondylosis without myelopathy or radiculopathy, lumbar region: Secondary | ICD-10-CM | POA: Diagnosis not present

## 2023-12-18 MED ORDER — METHYLPREDNISOLONE ACETATE 40 MG/ML IJ SUSP
40.0000 mg | Freq: Once | INTRAMUSCULAR | Status: AC
  Administered 2023-12-18: 40 mg

## 2023-12-18 NOTE — Patient Instructions (Signed)

## 2023-12-24 ENCOUNTER — Other Ambulatory Visit: Payer: Self-pay | Admitting: Cardiovascular Disease

## 2023-12-24 DIAGNOSIS — E785 Hyperlipidemia, unspecified: Secondary | ICD-10-CM

## 2023-12-25 ENCOUNTER — Other Ambulatory Visit: Payer: Self-pay

## 2023-12-25 ENCOUNTER — Other Ambulatory Visit (HOSPITAL_COMMUNITY): Payer: Self-pay

## 2023-12-25 ENCOUNTER — Ambulatory Visit (HOSPITAL_BASED_OUTPATIENT_CLINIC_OR_DEPARTMENT_OTHER): Payer: PPO | Admitting: Physical Therapy

## 2023-12-25 ENCOUNTER — Telehealth: Payer: Self-pay | Admitting: Pharmacy Technician

## 2023-12-25 ENCOUNTER — Telehealth: Payer: Self-pay

## 2023-12-25 NOTE — Telephone Encounter (Signed)
Pharmacy Patient Advocate Encounter  Received notification from [facility] ADVANT[employer]/RX ADVANCE that Prior Authorization for repatha has been APPROVED from 12/25/23 to 12/24/24   PA #/Case ID/Reference #: [id number]

## 2023-12-25 NOTE — Telephone Encounter (Signed)
Pharmacy Patient Advocate Encounter   Received notification from Fax that prior authorization for repatha is required/requested.   Insurance verification completed.   The patient is insured through [facility] ADVANTAGE/RX ADVANCE .   Per test claim: PA required; PA submitted to above mentioned insurance via CoverMyMeds Key/confirmation #/EOC [id number] Status is pending

## 2023-12-25 NOTE — Procedures (Signed)
Lumbar Facet Joint Nerve Denervation  Patient: Paul [name]      Date of Birth: October 15, 1956 MRN: [id number] PCP: Georgann Housekeeper, MD      Visit Date: 12/18/2023   Universal Protocol:    Date/Time: 12/24/2510:46 PM  Consent Given By: the patient  Position: PRONE  Additional Comments: Vital signs were monitored before and after the procedure. Patient was prepped and draped in the usual sterile fashion. The correct patient, procedure, and site was verified.   Injection Procedure Details:   Procedure diagnoses:  1. Spondylosis without myelopathy or radiculopathy, lumbar region      Meds Administered:  Meds ordered this encounter  Medications   methylPREDNISolone acetate (DEPO-MEDROL) injection 40 mg     Laterality: Right  Location/Site:  L2-L3, L1 and L2 medial branches and L3-L4, L2 and L3 medial branches  Needle: 18 ga.,  10mm active tip, RF Cannula  Needle Placement: Along juncture of superior articular process and transverse pocess  Findings:  -Comments:  Procedure Details: For each desired target nerve, the corresponding transverse process (sacral ala for the L5 dorsal rami) was identified and the fluoroscope was positioned to square off the endplates of the corresponding vertebral body to achieve a true AP midline view.  The beam was then obliqued 15 to 20 degrees and caudally tilted 15 to 20 degrees to line up a trajectory along the target nerves. The skin over the target of the junction of superior articulating process and transverse process (sacral ala for the L5 dorsal rami) was infiltrated with 1ml of 1% Lidocaine without Epinephrine.  The 18 gauge 10mm active tip outer cannula was advanced in trajectory view to the target.  This procedure was repeated for each target nerve.  Then, for all levels, the outer cannula placement was fine-tuned and the position was then confirmed with bi-planar imaging.    Test stimulation was done both at sensory and motor  levels to ensure there was no radicular stimulation. The target tissues were then infiltrated with 1 ml of 1% Lidocaine without Epinephrine. Subsequently, a percutaneous neurotomy was carried out for 90 seconds at 80 degrees Celsius.  After the completion of the lesion, 1 ml of injectate was delivered. It was then repeated for each facet joint nerve mentioned above. Appropriate radiographs were obtained to verify the probe placement during the neurotomy.   Additional Comments:  No complications occurred Dressing: 2 x 2 sterile gauze and Band-Aid    Post-procedure details: Patient was observed during the procedure. Post-procedure instructions were reviewed.  Patient left the clinic in stable condition.

## 2023-12-25 NOTE — Progress Notes (Signed)
Paul [name] - 68 y.o. male MRN [id number]  Date of birth: Jan 01, 1956  Office Visit Note: Visit Date: 12/18/2023 PCP: Georgann Housekeeper, MD Referred by: Georgann Housekeeper, MD  Subjective: Chief Complaint  Patient presents with   Lower Back - Pain   HPI:  Paul [name] is a 68 y.o. male who comes in todayfor planned radiofrequency ablation of the Right L2-3 and L3-4 Lumbar facet joints. This would be ablation of the corresponding medial branches and/or dorsal rami.  Patient has had double diagnostic blocks with more than 50% relief.  These are documented on pain diary.  They have had chronic back pain for quite some time, more than 3 months, which has been an ongoing situation with recalcitrant axial back pain.  They have no radicular pain.  Their axial pain is worse with standing and ambulating and on exam today with facet loading.  They have had physical therapy as well as home exercise program.  The imaging noted in the chart below indicated facet pathology. Accordingly they meet all the criteria and qualification for for radiofrequency ablation and we are going to complete this today hopefully for more longer term relief as part of comprehensive management program.   ROS Otherwise per HPI.  Assessment & Plan: Visit Diagnoses:    ICD-10-CM   1. Spondylosis without myelopathy or radiculopathy, lumbar region  M47.816 XR C-ARM NO REPORT    Radiofrequency,Lumbar    methylPREDNISolone acetate (DEPO-MEDROL) injection 40 mg      Plan: No additional findings.   Meds & Orders:  Meds ordered this encounter  Medications   methylPREDNISolone acetate (DEPO-MEDROL) injection 40 mg    Orders Placed This Encounter  Procedures   Radiofrequency,Lumbar   XR C-ARM NO REPORT    Follow-up: Return if symptoms worsen or fail to improve.   Procedures: No procedures performed  Lumbar Facet Joint Nerve Denervation  Patient: Paul [name]      Date of Birth: Apr 15, 1956 MRN:  [id number] PCP: Georgann Housekeeper, MD      Visit Date: 12/18/2023   Universal Protocol:    Date/Time: 12/24/2510:46 PM  Consent Given By: the patient  Position: PRONE  Additional Comments: Vital signs were monitored before and after the procedure. Patient was prepped and draped in the usual sterile fashion. The correct patient, procedure, and site was verified.   Injection Procedure Details:   Procedure diagnoses:  1. Spondylosis without myelopathy or radiculopathy, lumbar region      Meds Administered:  Meds ordered this encounter  Medications   methylPREDNISolone acetate (DEPO-MEDROL) injection 40 mg     Laterality: Right  Location/Site:  L2-L3, L1 and L2 medial branches and L3-L4, L2 and L3 medial branches  Needle: 18 ga.,  10mm active tip, RF Cannula  Needle Placement: Along juncture of superior articular process and transverse pocess  Findings:  -Comments:  Procedure Details: For each desired target nerve, the corresponding transverse process (sacral ala for the L5 dorsal rami) was identified and the fluoroscope was positioned to square off the endplates of the corresponding vertebral body to achieve a true AP midline view.  The beam was then obliqued 15 to 20 degrees and caudally tilted 15 to 20 degrees to line up a trajectory along the target nerves. The skin over the target of the junction of superior articulating process and transverse process (sacral ala for the L5 dorsal rami) was infiltrated with 1ml of 1% Lidocaine without Epinephrine.  The 18 gauge 10mm active tip outer cannula  was advanced in trajectory view to the target.  This procedure was repeated for each target nerve.  Then, for all levels, the outer cannula placement was fine-tuned and the position was then confirmed with bi-planar imaging.    Test stimulation was done both at sensory and motor levels to ensure there was no radicular stimulation. The target tissues were then infiltrated with 1 ml  of 1% Lidocaine without Epinephrine. Subsequently, a percutaneous neurotomy was carried out for 90 seconds at 80 degrees Celsius.  After the completion of the lesion, 1 ml of injectate was delivered. It was then repeated for each facet joint nerve mentioned above. Appropriate radiographs were obtained to verify the probe placement during the neurotomy.   Additional Comments:  No complications occurred Dressing: 2 x 2 sterile gauze and Band-Aid    Post-procedure details: Patient was observed during the procedure. Post-procedure instructions were reviewed.  Patient left the clinic in stable condition.      Clinical History: CLINICAL DATA:  Low back pain for 6 years.  No known injury.   EXAM: MRI LUMBAR SPINE WITHOUT CONTRAST   TECHNIQUE: Multiplanar, multisequence MR imaging of the lumbar spine was performed. No intravenous contrast was administered.   COMPARISON:  None Available.   FINDINGS: Segmentation:  Standard.   Alignment: 2 mm retrolisthesis of L1 on L2, L2 on L3, L3 on L4 and L4 on L5. mild dextro curvature of the thoracolumbar spine.   Vertebrae: No acute fracture, evidence of discitis, or aggressive bone lesion.   Conus medullaris and cauda equina: Conus extends to the T12-L1 level. Conus and cauda equina appear normal.   Paraspinal and other soft tissues: No acute paraspinal abnormality.   Disc levels:   Disc spaces: Degenerative disease with disc height loss at T11-12, T12-L1, L1-2, L2-3, L4-5 and L5-S1. Severe reactive endplate edema at J1-9.   T12-L1: Mild broad-based disc bulge. No foraminal or central canal stenosis.   L1-L2: Broad-based disc osteophyte complex. Small left paracentral disc protrusion. Mild bilateral facet arthropathy. Moderate-severe left foraminal stenosis. Moderate-severe scratch them moderate right foraminal stenosis. Scratch them moderate right foraminal stenosis. Minimal spinal stenosis. Bilateral lateral recess stenosis.    L2-L3: Broad-based disc bulge. Mild bilateral facet arthropathy. Mild left foraminal stenosis. No right foraminal stenosis. No spinal stenosis.   L3-L4: Broad-based disc bulge. Moderate bilateral facet arthropathy. Right lateral recess stenosis. Moderate right foraminal stenosis. No left foraminal stenosis. Mild spinal stenosis.   L4-L5: Broad-based disc bulge. Mild bilateral facet arthropathy. Moderate right foraminal stenosis. No left foraminal stenosis. No spinal stenosis.   L5-S1: Broad-based disc bulge with a small right foraminal disc protrusion contacting the right exiting L5 nerve root. Mild bilateral facet arthropathy. Moderate right foraminal stenosis. Mild left foraminal stenosis.   IMPRESSION: 1. At L1-2 there is a broad-based disc osteophyte complex. Small left paracentral disc protrusion. Mild bilateral facet arthropathy. Moderate-severe left foraminal stenosis. Moderate-severe scratch them moderate right foraminal stenosis. Bilateral lateral recess stenosis. 2. At L3-4 there is a broad-based disc bulge. Moderate bilateral facet arthropathy. Right lateral recess stenosis. Moderate right foraminal stenosis. No left foraminal stenosis. Mild spinal stenosis. 3. At L4-5 there is a broad-based disc bulge. Mild bilateral facet arthropathy. Moderate right foraminal stenosis. 4. At L5-S1 there is a broad-based disc bulge with a small right foraminal disc protrusion contacting the right exiting L5 nerve root. Mild bilateral facet arthropathy. Moderate right foraminal stenosis. Mild left foraminal stenosis. 5. No acute osseous injury of the lumbar spine.  Electronically Signed   By: Elige Ko M.D.   On: 10/25/2022 09:09     Objective:  VS:  HT:    WT:   BMI:     BP:   HR: bpm  TEMP: ( )  RESP:  Physical Exam Vitals and nursing note reviewed.  Constitutional:      General: He is not in acute distress.    Appearance: Normal appearance. He is not  ill-appearing.  HENT:     Head: Normocephalic and atraumatic.     Right Ear: External ear normal.     Left Ear: External ear normal.     Nose: No congestion.  Eyes:     Extraocular Movements: Extraocular movements intact.  Cardiovascular:     Rate and Rhythm: Normal rate.     Pulses: Normal pulses.  Pulmonary:     Effort: Pulmonary effort is normal. No respiratory distress.  Abdominal:     General: There is no distension.     Palpations: Abdomen is soft.  Musculoskeletal:        General: No tenderness or signs of injury.     Cervical back: Neck supple.     Right lower leg: No edema.     Left lower leg: No edema.     Comments: Patient has good distal strength without clonus.  Skin:    Findings: No erythema or [name].  Neurological:     General: No focal deficit present.     Mental Status: He is alert and oriented to person, place, and time.     Sensory: No sensory deficit.     Motor: No weakness or abnormal muscle tone.     Coordination: Coordination normal.  Psychiatric:        Mood and Affect: Mood normal.        Behavior: Behavior normal.      Imaging: No results found.

## 2023-12-25 NOTE — Telephone Encounter (Signed)
Received notification from [facility] pharmacy that patient requires a new authorization for their medication.  Submitted an URGENT Prior Authorization request to [facility] ADVANTAGE/RX ADVANCE for ENBREL via CoverMyMeds. Will update once we receive a response.  Key: [id number]

## 2023-12-26 ENCOUNTER — Other Ambulatory Visit: Payer: Self-pay

## 2023-12-26 ENCOUNTER — Other Ambulatory Visit (HOSPITAL_COMMUNITY): Payer: Self-pay

## 2023-12-26 NOTE — Progress Notes (Signed)
12/26/23 CMA: Enbrel   Patient is ok with copay of $115.77, and would like to pick up at [facility] on Friday 12/27/23. Will be ready after lunch time.

## 2023-12-26 NOTE — Telephone Encounter (Signed)
Received notification from [facility] ADVANTAGE/RX ADVANCE regarding a prior authorization for ENBREL. Authorization has been APPROVED from 12/25/2023 to 12/24/2024. Approval letter sent to scan center.  Authorization # [id number]  Chesley Mires, PharmD, MPH, BCPS, CPP Clinical Pharmacist (Rheumatology and Pulmonology)

## 2023-12-26 NOTE — Progress Notes (Addendum)
12/26/23 CMA: Enbrel  The copay is $254.84. Mills Koller will call to see if patient is ok with copay.

## 2023-12-27 ENCOUNTER — Other Ambulatory Visit: Payer: Self-pay

## 2023-12-27 NOTE — Telephone Encounter (Signed)
Received notification on the morning of 1/23 that pt's copay would be $254.84 as his grant has exhausted it's funds. Ran my own test claim that afternoon and the copay had dropped down to $115, presumably due to a prescription having been picked up and contributing towards pt's maximum out of pocket for 2025. Contacted pt's wife and discussed the situation as well as my suspicions. She is in agreement with paying copay and would like to pick it up from [facility] as usual.  Cam at [facility] notified, nothing further needed at this time.

## 2023-12-30 ENCOUNTER — Encounter: Payer: PPO | Admitting: Physical Medicine and Rehabilitation

## 2023-12-31 ENCOUNTER — Other Ambulatory Visit: Payer: Self-pay

## 2023-12-31 ENCOUNTER — Ambulatory Visit: Payer: PPO | Admitting: Physical Medicine and Rehabilitation

## 2023-12-31 DIAGNOSIS — M47816 Spondylosis without myelopathy or radiculopathy, lumbar region: Secondary | ICD-10-CM

## 2023-12-31 MED ORDER — METHYLPREDNISOLONE ACETATE 40 MG/ML IJ SUSP
40.0000 mg | Freq: Once | INTRAMUSCULAR | Status: AC
  Administered 2023-12-31: 40 mg

## 2023-12-31 NOTE — Progress Notes (Unsigned)
Paul [name] - 68 y.o. male MRN [id number]  Date of birth: [birth year]  Office Visit Note: Visit Date: 12/31/2023 PCP: Paul Housekeeper, MD Referred by: Paul Housekeeper, MD  Subjective: Chief Complaint  Patient presents with   Lower Back - Pain   HPI:  Paul [name] is a 68 y.o. male who comes in todayfor planned radiofrequency ablation of the Left L2-3 and L3-4 Lumbar facet joints. This would be ablation of the corresponding medial branches and/or dorsal rami.  Patient has had double diagnostic blocks with more than 50% relief.  These are documented on pain diary.  They have had chronic back pain for quite some time, more than 3 months, which has been an ongoing situation with recalcitrant axial back pain.  They have no radicular pain.  Their axial pain is worse with standing and ambulating and on exam today with facet loading.  They have had physical therapy as well as home exercise program.  The imaging noted in the chart below indicated facet pathology. Accordingly they meet all the criteria and qualification for for radiofrequency ablation and we are going to complete this today hopefully for more longer term relief as part of comprehensive management program.   ROS Otherwise per HPI.  Assessment & Plan: Visit Diagnoses:    ICD-10-CM   1. Spondylosis without myelopathy or radiculopathy, lumbar region  M47.816 XR C-ARM NO REPORT    Radiofrequency,Lumbar    methylPREDNISolone acetate (DEPO-MEDROL) injection 40 mg      Plan: No additional findings.   Meds & Orders:  Meds ordered this encounter  Medications   methylPREDNISolone acetate (DEPO-MEDROL) injection 40 mg    Orders Placed This Encounter  Procedures   Radiofrequency,Lumbar   XR C-ARM NO REPORT    Follow-up: Return if symptoms worsen or fail to improve.   Procedures: No procedures performed  Lumbar Facet Joint Nerve Denervation  Patient: Paul [name]      Date of Birth: Aug 20, 1956 MRN:  [id number] PCP: Paul Housekeeper, MD      Visit Date:    Universal Protocol:    Date/Time: 12/30/2508:22 AM  Consent Given By: the patient  Position: PRONE  Additional Comments: Vital signs were monitored before and after the procedure. Patient was prepped and draped in the usual sterile fashion. The correct patient, procedure, and site was verified.   Injection Procedure Details:   Procedure diagnoses: Spondylosis without myelopathy or radiculopathy, lumbar region  (primary encounter diagnosis)   Meds Administered: Orders Placed This Encounter     methylPREDNISolone acetate (DEPO-MEDROL) injection 40 mg    Laterality: Left  Location/Site:  L2-L3, L1 and L2 medial branches and L3-L4, L2 and L3 medial branches  Needle: 18 ga.,  10mm active tip, RF Cannula  Needle Placement: Along juncture of superior articular process and transverse pocess  Findings:  -Comments:  Procedure Details: For each desired target nerve, the corresponding transverse process (sacral ala for the L5 dorsal rami) was identified and the fluoroscope was positioned to square off the endplates of the corresponding vertebral body to achieve a true AP midline view.  The beam was then obliqued 15 to 20 degrees and caudally tilted 15 to 20 degrees to line up a trajectory along the target nerves. The skin over the target of the junction of superior articulating process and transverse process (sacral ala for the L5 dorsal rami) was infiltrated with 1ml of 1% Lidocaine without Epinephrine.  The 18 gauge 10mm active tip outer cannula was advanced in  trajectory view to the target.  This procedure was repeated for each target nerve.  Then, for all levels, the outer cannula placement was fine-tuned and the position was then confirmed with bi-planar imaging.    Test stimulation was done both at sensory and motor levels to ensure there was no radicular stimulation. The target tissues were then infiltrated with 1 ml of  1% Lidocaine without Epinephrine. Subsequently, a percutaneous neurotomy was carried out for 90 seconds at 80 degrees Celsius.  After the completion of the lesion, 1 ml of injectate was delivered. It was then repeated for each facet joint nerve mentioned above. Appropriate radiographs were obtained to verify the probe placement during the neurotomy.   Additional Comments:  No complications occurred Dressing: 2 x 2 sterile gauze and Band-Aid    Post-procedure details: Patient was observed during the procedure. Post-procedure instructions were reviewed.  Patient left the clinic in stable condition.   Clinical History: CLINICAL DATA:  Low back pain for 6 years.  No known injury.   EXAM: MRI LUMBAR SPINE WITHOUT CONTRAST   TECHNIQUE: Multiplanar, multisequence MR imaging of the lumbar spine was performed. No intravenous contrast was administered.   COMPARISON:  None Available.   FINDINGS: Segmentation:  Standard.   Alignment: 2 mm retrolisthesis of L1 on L2, L2 on L3, L3 on L4 and L4 on L5. mild dextro curvature of the thoracolumbar spine.   Vertebrae: No acute fracture, evidence of discitis, or aggressive bone lesion.   Conus medullaris and cauda equina: Conus extends to the T12-L1 level. Conus and cauda equina appear normal.   Paraspinal and other soft tissues: No acute paraspinal abnormality.   Disc levels:   Disc spaces: Degenerative disease with disc height loss at T11-12, T12-L1, L1-2, L2-3, L4-5 and L5-S1. Severe reactive endplate edema at V9-5.   T12-L1: Mild broad-based disc bulge. No foraminal or central canal stenosis.   L1-L2: Broad-based disc osteophyte complex. Small left paracentral disc protrusion. Mild bilateral facet arthropathy. Moderate-severe left foraminal stenosis. Moderate-severe scratch them moderate right foraminal stenosis. Scratch them moderate right foraminal stenosis. Minimal spinal stenosis. Bilateral lateral recess stenosis.   L2-L3:  Broad-based disc bulge. Mild bilateral facet arthropathy. Mild left foraminal stenosis. No right foraminal stenosis. No spinal stenosis.   L3-L4: Broad-based disc bulge. Moderate bilateral facet arthropathy. Right lateral recess stenosis. Moderate right foraminal stenosis. No left foraminal stenosis. Mild spinal stenosis.   L4-L5: Broad-based disc bulge. Mild bilateral facet arthropathy. Moderate right foraminal stenosis. No left foraminal stenosis. No spinal stenosis.   L5-S1: Broad-based disc bulge with a small right foraminal disc protrusion contacting the right exiting L5 nerve root. Mild bilateral facet arthropathy. Moderate right foraminal stenosis. Mild left foraminal stenosis.   IMPRESSION: 1. At L1-2 there is a broad-based disc osteophyte complex. Small left paracentral disc protrusion. Mild bilateral facet arthropathy. Moderate-severe left foraminal stenosis. Moderate-severe scratch them moderate right foraminal stenosis. Bilateral lateral recess stenosis. 2. At L3-4 there is a broad-based disc bulge. Moderate bilateral facet arthropathy. Right lateral recess stenosis. Moderate right foraminal stenosis. No left foraminal stenosis. Mild spinal stenosis. 3. At L4-5 there is a broad-based disc bulge. Mild bilateral facet arthropathy. Moderate right foraminal stenosis. 4. At L5-S1 there is a broad-based disc bulge with a small right foraminal disc protrusion contacting the right exiting L5 nerve root. Mild bilateral facet arthropathy. Moderate right foraminal stenosis. Mild left foraminal stenosis. 5. No acute osseous injury of the lumbar spine.     Electronically Signed   By: Alan Ripper  Patel M.D.   On: 10/25/2022 09:09     Objective:  VS:  HT:    WT:   BMI:     BP:   HR: bpm  TEMP: ( )  RESP:  Physical Exam   Imaging: No results found.

## 2023-12-31 NOTE — Procedures (Unsigned)
Lumbar Facet Joint Nerve Denervation  Patient: Paul [name]      Date of Birth: 1956/08/05 MRN: [id number] PCP: Georgann Housekeeper, MD      Visit Date:    Universal Protocol:    Date/Time: 12/30/2508:22 AM  Consent Given By: the patient  Position: PRONE  Additional Comments: Vital signs were monitored before and after the procedure. Patient was prepped and draped in the usual sterile fashion. The correct patient, procedure, and site was verified.   Injection Procedure Details:   Procedure diagnoses: Spondylosis without myelopathy or radiculopathy, lumbar region  (primary encounter diagnosis)   Meds Administered: Orders Placed This Encounter     methylPREDNISolone acetate (DEPO-MEDROL) injection 40 mg    Laterality: Left  Location/Site:  L2-L3, L1 and L2 medial branches and L3-L4, L2 and L3 medial branches  Needle: 18 ga.,  10mm active tip, RF Cannula  Needle Placement: Along juncture of superior articular process and transverse pocess  Findings:  -Comments:  Procedure Details: For each desired target nerve, the corresponding transverse process (sacral ala for the L5 dorsal rami) was identified and the fluoroscope was positioned to square off the endplates of the corresponding vertebral body to achieve a true AP midline view.  The beam was then obliqued 15 to 20 degrees and caudally tilted 15 to 20 degrees to line up a trajectory along the target nerves. The skin over the target of the junction of superior articulating process and transverse process (sacral ala for the L5 dorsal rami) was infiltrated with 1ml of 1% Lidocaine without Epinephrine.  The 18 gauge 10mm active tip outer cannula was advanced in trajectory view to the target.  This procedure was repeated for each target nerve.  Then, for all levels, the outer cannula placement was fine-tuned and the position was then confirmed with bi-planar imaging.    Test stimulation was done both at sensory and motor  levels to ensure there was no radicular stimulation. The target tissues were then infiltrated with 1 ml of 1% Lidocaine without Epinephrine. Subsequently, a percutaneous neurotomy was carried out for 90 seconds at 80 degrees Celsius.  After the completion of the lesion, 1 ml of injectate was delivered. It was then repeated for each facet joint nerve mentioned above. Appropriate radiographs were obtained to verify the probe placement during the neurotomy.   Additional Comments:  No complications occurred Dressing: 2 x 2 sterile gauze and Band-Aid    Post-procedure details: Patient was observed during the procedure. Post-procedure instructions were reviewed.  Patient left the clinic in stable condition.

## 2023-12-31 NOTE — Patient Instructions (Signed)

## 2024-01-01 ENCOUNTER — Ambulatory Visit (HOSPITAL_BASED_OUTPATIENT_CLINIC_OR_DEPARTMENT_OTHER): Payer: PPO | Admitting: Physical Therapy

## 2024-01-01 ENCOUNTER — Encounter (HOSPITAL_BASED_OUTPATIENT_CLINIC_OR_DEPARTMENT_OTHER): Payer: Self-pay | Admitting: Physical Therapy

## 2024-01-01 DIAGNOSIS — M5459 Other low back pain: Secondary | ICD-10-CM

## 2024-01-01 DIAGNOSIS — M6281 Muscle weakness (generalized): Secondary | ICD-10-CM
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
## 2024-01-15 ENCOUNTER — Other Ambulatory Visit (HOSPITAL_COMMUNITY): Payer: Self-pay
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
## 2024-01-17 ENCOUNTER — Other Ambulatory Visit: Payer: Self-pay

## 2024-01-17 ENCOUNTER — Other Ambulatory Visit (HOSPITAL_COMMUNITY): Payer: Self-pay

## 2024-01-17 NOTE — Progress Notes (Signed)
Specialty Pharmacy Refill Coordination Note  Paul [name] is a 68 y.o. male contacted today regarding refills of specialty medication(s) Etanercept (Enbrel Mini)   Patient requested (Patient-Rptd) Pickup at [facility] date: (Patient-Rptd) 01/23/24   Medication will be filled on 02.19.25.

## 2024-01-20 ENCOUNTER — Ambulatory Visit: Payer: PPO | Admitting: Cardiovascular Disease

## 2024-01-22 ENCOUNTER — Other Ambulatory Visit: Payer: Self-pay

## 2024-01-29 ENCOUNTER — Ambulatory Visit: Payer: PPO | Attending: Physician Assistant | Admitting: Physician Assistant

## 2024-01-29 ENCOUNTER — Ambulatory Visit: Payer: PPO

## 2024-01-29 ENCOUNTER — Ambulatory Visit (INDEPENDENT_AMBULATORY_CARE_PROVIDER_SITE_OTHER): Payer: PPO

## 2024-01-29 ENCOUNTER — Encounter: Payer: Self-pay | Admitting: Physician Assistant

## 2024-01-29 VITALS — BP 123/78 | HR 62 | Resp 16 | Ht 72.0 in | Wt 177.4 lb

## 2024-01-29 DIAGNOSIS — F32A Depression, unspecified: Secondary | ICD-10-CM

## 2024-01-29 DIAGNOSIS — R748 Abnormal levels of other serum enzymes: Secondary | ICD-10-CM

## 2024-01-29 DIAGNOSIS — M19042 Primary osteoarthritis, left hand: Secondary | ICD-10-CM

## 2024-01-29 DIAGNOSIS — Z8659 Personal history of other mental and behavioral disorders: Secondary | ICD-10-CM

## 2024-01-29 DIAGNOSIS — M19072 Primary osteoarthritis, left ankle and foot: Secondary | ICD-10-CM

## 2024-01-29 DIAGNOSIS — M79641 Pain in right hand: Secondary | ICD-10-CM | POA: Diagnosis not present

## 2024-01-29 DIAGNOSIS — M79671 Pain in right foot: Secondary | ICD-10-CM | POA: Diagnosis not present

## 2024-01-29 DIAGNOSIS — M0600 Rheumatoid arthritis without rheumatoid factor, unspecified site: Secondary | ICD-10-CM

## 2024-01-29 DIAGNOSIS — Z79899 Other long term (current) drug therapy: Secondary | ICD-10-CM

## 2024-01-29 DIAGNOSIS — M19071 Primary osteoarthritis, right ankle and foot: Secondary | ICD-10-CM | POA: Diagnosis not present

## 2024-01-29 DIAGNOSIS — F419 Anxiety disorder, unspecified: Secondary | ICD-10-CM

## 2024-01-29 DIAGNOSIS — M19041 Primary osteoarthritis, right hand: Secondary | ICD-10-CM | POA: Diagnosis not present

## 2024-01-29 DIAGNOSIS — M5030 Other cervical disc degeneration, unspecified cervical region: Secondary | ICD-10-CM

## 2024-01-29 DIAGNOSIS — Z8639 Personal history of other endocrine, nutritional and metabolic disease: Secondary | ICD-10-CM

## 2024-01-29 DIAGNOSIS — M79642 Pain in left hand: Secondary | ICD-10-CM

## 2024-01-29 DIAGNOSIS — Z111 Encounter for screening for respiratory tuberculosis: Secondary | ICD-10-CM

## 2024-01-29 DIAGNOSIS — R5383 Other fatigue: Secondary | ICD-10-CM

## 2024-01-29 DIAGNOSIS — Z8679 Personal history of other diseases of the circulatory system: Secondary | ICD-10-CM

## 2024-01-29 DIAGNOSIS — M79672 Pain in left foot: Secondary | ICD-10-CM | POA: Diagnosis not present

## 2024-01-29 DIAGNOSIS — M51369 Other intervertebral disc degeneration, lumbar region without mention of lumbar back pain or lower extremity pain: Secondary | ICD-10-CM

## 2024-01-29 DIAGNOSIS — Z87438 Personal history of other diseases of male genital organs: Secondary | ICD-10-CM

## 2024-01-29 MED ORDER — PREDNISONE 5 MG PO TABS: ORAL_TABLET | ORAL | 0 refills | Status: DC

## 2024-01-29 NOTE — Progress Notes (Signed)
 X-rays of both hands and both feet are consistent with rheumatoid arthritis and osteoarthritis overlap.  No radiographic progression noted since 2021.  Please notify the patient.

## 2024-02-03 DIAGNOSIS — M0600 Rheumatoid arthritis without rheumatoid factor, unspecified site: Secondary | ICD-10-CM | POA: Diagnosis not present

## 2024-02-04 LAB — C-REACTIVE PROTEIN: CRP: 1 mg/L (ref 0–10)

## 2024-02-04 LAB — SEDIMENTATION RATE: Sed Rate: 8 mm/h (ref 0–30)

## 2024-02-04 NOTE — Progress Notes (Signed)
 ESR and CRP WNL
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
## 2024-02-05 ENCOUNTER — Encounter: Payer: PPO | Attending: Physical Medicine and Rehabilitation | Admitting: Physical Medicine and Rehabilitation

## 2024-02-05 ENCOUNTER — Encounter: Payer: Self-pay | Admitting: Physical Medicine and Rehabilitation

## 2024-02-05 VITALS — BP 116/74 | HR 62 | Ht 72.0 in | Wt 179.0 lb

## 2024-02-05 DIAGNOSIS — M19041 Primary osteoarthritis, right hand: Secondary | ICD-10-CM | POA: Diagnosis not present

## 2024-02-05 DIAGNOSIS — R52 Pain, unspecified: Secondary | ICD-10-CM | POA: Insufficient documentation

## 2024-02-05 DIAGNOSIS — G8929 Other chronic pain: Secondary | ICD-10-CM | POA: Diagnosis not present

## 2024-02-05 DIAGNOSIS — R5382 Chronic fatigue, unspecified: Secondary | ICD-10-CM

## 2024-02-05 DIAGNOSIS — G894 Chronic pain syndrome: Secondary | ICD-10-CM

## 2024-02-05 DIAGNOSIS — R413 Other amnesia: Secondary | ICD-10-CM

## 2024-02-05 DIAGNOSIS — M545 Low back pain, unspecified: Secondary | ICD-10-CM | POA: Diagnosis not present

## 2024-02-05 DIAGNOSIS — M19042 Primary osteoarthritis, left hand: Secondary | ICD-10-CM | POA: Diagnosis not present

## 2024-02-05 DIAGNOSIS — M542 Cervicalgia: Secondary | ICD-10-CM

## 2024-02-05 NOTE — Patient Instructions (Addendum)
 Today, we talked about options to control your chronic pain.  Unfortunately, there are many limiting factors in this.  Given concerns for arrhythmia/prolonged Qtc with buprenorphine, I agree with coming off of this medication at this time, but other opiate medications are generally a higher risk for arrhythmia so I would not replace this with another opiate medication.  Tramadol was discussed and ruled out due to risk of serotonin syndrome.  Tylenol with codeine was discussed and decided this would be more sedating than helpful, also will not pursue this.  Patient is already on duloxetine and getting treatment for his rheumatoid arthritis through Dr. Corliss Skains, his symptoms are generally well-controlled except for some intermittent flares which are responsive to once yearly steroid tapers.  Agree with use of Voltaren gel over-the-counter up to 4 times daily for localized symptoms in his hands, along with as needed Tylenol up to 1000 mg in a single dose and up to 3 times daily total.  Follow-up as needed.

## 2024-02-17 ENCOUNTER — Other Ambulatory Visit: Payer: Self-pay

## 2024-02-17 ENCOUNTER — Other Ambulatory Visit: Payer: Self-pay | Admitting: Rheumatology

## 2024-02-17 ENCOUNTER — Other Ambulatory Visit (HOSPITAL_COMMUNITY): Payer: Self-pay

## 2024-02-17 DIAGNOSIS — M0600 Rheumatoid arthritis without rheumatoid factor, unspecified site: Secondary | ICD-10-CM

## 2024-02-17 MED ORDER — ENBREL MINI 50 MG/ML ~~LOC~~ SOCT
50.0000 mg | SUBCUTANEOUS | 2 refills | Status: DC
  Filled 2024-02-17: qty 4, 28d supply, fill #0
  Filled 2024-03-13: qty 4, 28d supply, fill #1
  Filled 2024-04-10: qty 4, 28d supply, fill #2

## 2024-02-17 NOTE — Telephone Encounter (Signed)
 Last Fill: 12/15/2023   Labs: 12/15/2023 RBC 4.15, HCT 38.9, BMP Glucose 117, Creatinine 1.32, GFR 60  TB Gold: 05/03/2023  TB gold negative   Next Visit: 06/30/2024  Last Visit: 01/29/2024  [id number] rheumatoid arthritis   Current Dose per office note 01/29/2024: Enbrel 50 mg sq injections once weekly.   Okay to refill Enbrel?

## 2024-02-17 NOTE — Progress Notes (Signed)
 Specialty Pharmacy Refill Coordination Note  Paul [name] is a 68 y.o. male contacted today regarding refills of specialty medication(s) Etanercept (Enbrel Mini)   Patient requested Pickup at [facility] at [facility] date: 02/19/24   Medication will be filled on 02/18/24, pending refill approval.

## 2024-02-18 DIAGNOSIS — F331 Major depressive disorder, recurrent, moderate: Secondary | ICD-10-CM | POA: Diagnosis not present

## 2024-02-18 DIAGNOSIS — D84821 Immunodeficiency due to drugs: Secondary | ICD-10-CM | POA: Diagnosis not present

## 2024-02-18 DIAGNOSIS — I700 Atherosclerosis of aorta: Secondary | ICD-10-CM | POA: Diagnosis not present

## 2024-02-18 DIAGNOSIS — I2510 Atherosclerotic heart disease of native coronary artery without angina pectoris: Secondary | ICD-10-CM | POA: Diagnosis not present

## 2024-02-18 DIAGNOSIS — E039 Hypothyroidism, unspecified: Secondary | ICD-10-CM | POA: Diagnosis not present

## 2024-02-18 DIAGNOSIS — N182 Chronic kidney disease, stage 2 (mild): Secondary | ICD-10-CM | POA: Diagnosis not present

## 2024-02-18 DIAGNOSIS — F988 Other specified behavioral and emotional disorders with onset usually occurring in childhood and adolescence: Secondary | ICD-10-CM | POA: Diagnosis not present

## 2024-02-18 DIAGNOSIS — M0600 Rheumatoid arthritis without rheumatoid factor, unspecified site: Secondary | ICD-10-CM | POA: Diagnosis not present

## 2024-02-18 DIAGNOSIS — N400 Enlarged prostate without lower urinary tract symptoms: Secondary | ICD-10-CM | POA: Diagnosis not present

## 2024-02-18 DIAGNOSIS — I471 Supraventricular tachycardia, unspecified: Secondary | ICD-10-CM | POA: Diagnosis not present

## 2024-02-18 DIAGNOSIS — R7303 Prediabetes: Secondary | ICD-10-CM | POA: Diagnosis not present

## 2024-03-11 DIAGNOSIS — R413 Other amnesia: Secondary | ICD-10-CM | POA: Diagnosis not present

## 2024-03-11 DIAGNOSIS — F909 Attention-deficit hyperactivity disorder, unspecified type: Secondary | ICD-10-CM | POA: Diagnosis not present

## 2024-03-11 DIAGNOSIS — G4733 Obstructive sleep apnea (adult) (pediatric): Secondary | ICD-10-CM | POA: Diagnosis not present

## 2024-03-11 DIAGNOSIS — G4710 Hypersomnia, unspecified: Secondary | ICD-10-CM | POA: Diagnosis not present

## 2024-03-13 ENCOUNTER — Other Ambulatory Visit: Payer: Self-pay

## 2024-03-13 NOTE — Progress Notes (Signed)
 Specialty Pharmacy Refill Coordination Note  Paul [name] is a 68 y.o. male contacted today regarding refills of specialty medication(s) Etanercept (Enbrel Mini)   Patient requested (Patient-Rptd) Pickup at [facility] at [facility] date: (Patient-Rptd) 03/14/24   Medication will be filled on 04.14.25, called and patient wife is aware.

## 2024-03-16 ENCOUNTER — Other Ambulatory Visit: Payer: Self-pay

## 2024-04-05 DIAGNOSIS — I959 Hypotension, unspecified: Secondary | ICD-10-CM | POA: Diagnosis not present

## 2024-04-05 DIAGNOSIS — R0689 Other abnormalities of breathing: Secondary | ICD-10-CM | POA: Diagnosis not present

## 2024-04-05 DIAGNOSIS — R000 Tachycardia, unspecified: Secondary | ICD-10-CM | POA: Diagnosis not present

## 2024-04-05 DIAGNOSIS — I471 Supraventricular tachycardia, unspecified: Secondary | ICD-10-CM | POA: Diagnosis not present

## 2024-04-08 ENCOUNTER — Ambulatory Visit: Payer: PPO | Admitting: Cardiovascular Disease

## 2024-04-10 ENCOUNTER — Encounter: Payer: Self-pay | Admitting: Cardiology

## 2024-04-10 ENCOUNTER — Other Ambulatory Visit: Payer: Self-pay

## 2024-04-10 DIAGNOSIS — R413 Other amnesia: Secondary | ICD-10-CM | POA: Diagnosis not present

## 2024-04-10 DIAGNOSIS — G4710 Hypersomnia, unspecified: Secondary | ICD-10-CM | POA: Diagnosis not present

## 2024-04-10 DIAGNOSIS — G4733 Obstructive sleep apnea (adult) (pediatric): Secondary | ICD-10-CM | POA: Diagnosis not present

## 2024-04-10 DIAGNOSIS — F909 Attention-deficit hyperactivity disorder, unspecified type: Secondary | ICD-10-CM | POA: Diagnosis not present

## 2024-04-10 NOTE — Progress Notes (Signed)
 Specialty Pharmacy Refill Coordination Note  Paul [name] is a 68 y.o. male contacted today regarding refills of specialty medication(s) Etanercept  (Enbrel  Mini)   Patient requested [name] at [facility] at [place] date: 04/16/24   Medication will be filled on 04/15/24.

## 2024-04-10 NOTE — Progress Notes (Signed)
 Specialty Pharmacy Ongoing Clinical Assessment Note  Paul [name] is a 68 y.o. male who is being followed by the specialty pharmacy service for RxSp Rheumatoid Arthritis   Patient's specialty medication(s) reviewed today: Etanercept  (Enbrel  Mini)   Missed doses in the last 4 weeks: 0   Patient/Caregiver did not have any additional questions or concerns.   Therapeutic benefit summary: Patient is achieving benefit   Adverse events/side effects summary: No adverse events/side effects   Patient's therapy is appropriate to: Continue    Goals Addressed             This Visit's Progress    Minimize recurrence of flares   On track    Patient is on track. Patient will maintain adherence.  Patent reports that he remains well-controlled.          Follow up: 6 months  Saahas Hidrogo M Enrika Aguado Specialty Pharmacist

## 2024-04-13 ENCOUNTER — Other Ambulatory Visit: Payer: Self-pay

## 2024-04-13 MED ORDER — DILTIAZEM HCL ER COATED BEADS 120 MG PO CP24: 120.0000 mg | ORAL_CAPSULE | Freq: Every day | ORAL | 3 refills | Status: DC

## 2024-04-24 ENCOUNTER — Emergency Department (HOSPITAL_BASED_OUTPATIENT_CLINIC_OR_DEPARTMENT_OTHER): Admitting: Radiology

## 2024-04-24 ENCOUNTER — Encounter (HOSPITAL_BASED_OUTPATIENT_CLINIC_OR_DEPARTMENT_OTHER): Payer: Self-pay | Admitting: Emergency Medicine

## 2024-04-24 ENCOUNTER — Emergency Department (HOSPITAL_BASED_OUTPATIENT_CLINIC_OR_DEPARTMENT_OTHER)
Admission: EM | Admit: 2024-04-24 | Discharge: 2024-04-24 | Disposition: A | Discharge status: Home / Self Care | Attending: Emergency Medicine | Admitting: Emergency Medicine

## 2024-04-24 ENCOUNTER — Other Ambulatory Visit: Payer: Self-pay

## 2024-04-24 DIAGNOSIS — Z720 Tobacco use: Secondary | ICD-10-CM | POA: Insufficient documentation

## 2024-04-24 DIAGNOSIS — R2242 Localized swelling, mass and lump, left lower limb: Secondary | ICD-10-CM | POA: Insufficient documentation

## 2024-04-24 DIAGNOSIS — M7989 Other specified soft tissue disorders: Secondary | ICD-10-CM | POA: Diagnosis not present

## 2024-04-24 DIAGNOSIS — M25469 Effusion, unspecified knee: Secondary | ICD-10-CM

## 2024-04-24 DIAGNOSIS — Z7982 Long term (current) use of aspirin: Secondary | ICD-10-CM | POA: Diagnosis not present

## 2024-04-24 DIAGNOSIS — M25562 Pain in left knee: Secondary | ICD-10-CM | POA: Diagnosis not present

## 2024-04-24 DIAGNOSIS — M25462 Effusion, left knee: Secondary | ICD-10-CM | POA: Diagnosis not present

## 2024-04-24 MED ORDER — PREDNISONE 20 MG PO TABS: 40.0000 mg | ORAL_TABLET | Freq: Every day | ORAL | 0 refills | Status: AC

## 2024-04-24 NOTE — ED Triage Notes (Signed)
 Left knee swelling x 5 days. Some soreness. Ambulatory to triage.

## 2024-04-24 NOTE — ED Provider Notes (Signed)
 [facility] EMERGENCY DEPARTMENT AT [facility] Provider Note   CSN: [id number] Arrival date & time: 04/24/24  1336     History  Chief Complaint  Patient presents with   Joint Swelling    Paul [name] is a 68 y.o. male with a history of rheumatoid arthritis, tachycardia, and BPH who presents the ED today for knee swelling.  Patient endorses swelling to the left anterior knee for the past 5 days.  He has been icing his knee and following RICE therapy without improvement.  Denies any injury or trauma prior to onset of symptoms.  No pain to the knee.  He is able to ambulate independently.  No pain to the calf or thigh. No fevers.  No additional complaints or concerns at this time.    Home Medications Prior to Admission medications   Medication Sig Start Date End Date Taking? Authorizing Provider  predniSONE  (DELTASONE ) 20 MG tablet Take 2 tablets (40 mg total) by mouth daily for 5 days. 04/24/24 04/29/24 Yes Sonnie Dusky, PA-C  aspirin  EC 81 MG tablet Take 1 tablet (81 mg total) by mouth daily. 07/13/16   Loyde Rule, MD  Blood Glucose Monitoring Suppl (CONTOUR NEXT MONITOR) w/Device KIT Test blood sugar as needed 04/28/21   Nishan, Peter C, MD  cholecalciferol (VITAMIN D ) 1000 units tablet Take 2,000 Units by mouth daily.    [provider]  Cyanocobalamin  (VITAMIN B-12 PO) Take by mouth daily.    [provider]  CYMBALTA  30 MG capsule 1 capsule along with 60 mg Orally Once a day (90 mg) for 90 days 01/17/24   [provider]  diazepam  (VALIUM ) 5 MG tablet Take one tablet by mouth with food one hour prior to procedure. May repeat 30 minutes prior if needed. 11/25/23   Williams, Megan E, NP  diclofenac  sodium (VOLTAREN ) 1 % GEL Apply 3 grams to 3 large joints up to 3 times daily Patient taking differently: Apply 4 g topically daily as needed (pain). 12/17/18   Romayne Clubs, PA-C  diltiazem  (CARDIZEM  CD) 120 MG 24 hr capsule Take 1 capsule (120 mg  total) by mouth daily. 04/13/24   Camnitz, Babetta Lesch, MD  diltiazem  (CARDIZEM ) 30 MG tablet Take 1 tablet (30 mg total) by mouth 4 (four) times daily as needed (elevated heart rates). 05/01/23   Camnitz, Babetta Lesch, MD  DULoxetine  (CYMBALTA ) 60 MG capsule Take 60 mg by mouth daily.    [provider]  etanercept  (ENBREL  MINI) 50 MG/ML injection INJECT 50 MG INTO THE SKIN ONCE A WEEK. 02/17/24 02/16/25  Nicholas Bari, MD  Ferrous Sulfate  (IRON ) 325 (65 Fe) MG TABS Take by mouth daily.    [provider]  glucose blood test strip Test blood sugar once daily as needed 05/14/22   Pavero, Veryl Gottron, RPH  levothyroxine  (SYNTHROID ) 88 MCG tablet Take 88 mcg by mouth AC breakfast.    [provider]  Microlet Lancets MISC Use to check blood sugar as needed 04/28/21   Nishan, Peter C, MD  Multiple Vitamin (MULTIVITAMIN) capsule Take 1 capsule by mouth daily.    [provider]  naloxone  (NARCAN ) nasal spray 4 mg/0.1 mL See instructions on packaging 10/16/23   Bea Lime, DO  QELBREE 200 MG 24 hr capsule Take 200 mg by mouth daily. 10/01/22   [provider]  REPATHA  SURECLICK 140 MG/ML SOAJ INJECT 140 MG into THE SKIN EVERY 14 DAYS 12/24/23   Swinyer, Leilani Punter, NP  tamsulosin  (  FLOMAX ) 0.4 MG CAPS capsule Take 0.4 mg by mouth at bedtime.     [provider]      Allergies    Amphetamine-dextroamphet er    Review of Systems   Review of Systems  Musculoskeletal:  Positive for joint swelling.  All other systems reviewed and are negative.   Physical Exam Updated Vital Signs BP (!) 140/88 (BP Location: Left Arm)   Pulse 75   Temp 97.8 F (36.6 C) (Oral)   Resp 18   SpO2 97%  Physical Exam Vitals and nursing note reviewed.  Constitutional:      General: He is not in acute distress.    Appearance: Normal appearance.  HENT:     Head: Normocephalic and atraumatic.     Mouth/Throat:     Mouth: Mucous membranes are moist.  Eyes:      Conjunctiva/sclera: Conjunctivae normal.     Pupils: Pupils are equal, round, and reactive to light.  Cardiovascular:     Rate and Rhythm: Normal rate and regular rhythm.     Pulses: Normal pulses.  Pulmonary:     Effort: Pulmonary effort is normal.     Breath sounds: Normal breath sounds.  Abdominal:     Palpations: Abdomen is soft.     Tenderness: There is no abdominal tenderness.  Musculoskeletal:        General: Swelling present. No tenderness. Normal range of motion.     Cervical back: Normal range of motion.     Comments: Swelling to the medial lateral aspects of left knee with fluctuance and tenderness.  No tenderness, erythema, or impaired ROM.  Skin:    General: Skin is warm and dry.     Findings: No rash.  Neurological:     General: No focal deficit present.     Mental Status: He is alert.  Psychiatric:        Mood and Affect: Mood normal.        Behavior: Behavior normal.     ED Results / Procedures / Treatments   Labs (all labs ordered are listed, but only abnormal results are displayed) Labs Reviewed - No data to display  EKG None  Radiology DG Knee Complete 4 Views Left Result Date: 04/24/2024 CLINICAL DATA:  Knee pain and swelling EXAM: LEFT KNEE - COMPLETE 4 VIEW COMPARISON:  None Available. 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 Minimal osteophytes along the patellofemoral joint. No fracture or dislocation. Small joint effusion on lateral view. Please correlate with symptoms and clinical presentation as this has a differential. IMPRESSION: Chondrocalcinosis. Small joint effusion. Please correlate with clinical presentation such as trauma, infection. Broad differential. Further workup as clinically appropriate. Electronically Signed   By: Adrianna Horde M.D.   On: 04/24/2024 15:38    Procedures Procedures    Medications Ordered in ED Medications - No data to display  ED Course/ Medical Decision Making/ A&P                                  Medical Decision Making Amount and/or Complexity of Data Reviewed Radiology: ordered.  Risk Prescription drug management.   This patient presents to the ED for concern of knee swelling, this involves an extensive number of treatment options, and is a complaint that carries with it a high risk of complications and morbidity.   Differential diagnosis includes: hematoma, contusion, ligamentous injury, overuse injury, fracture, dislocation, joint effusion,  septic arthritis, etc. Low suspicion for septic arthritis - no fevers or impaired ROM   Comorbidities  See HPI above   Additional History  Additional history obtained from prior records   Imaging Studies  I ordered imaging studies including left knee x-ray  I independently visualized and interpreted imaging which showed:  Chondrocalcinosis.  Small joint effusion on lateral view. I agree with the radiologist interpretation   Problem List / ED Course / Critical Interventions / Medication Management  Patient reports swelling to the medial and lateral sides of his left knee for the past 5 days.  Denies any injury or trauma prior to onset of pain.  No pain to the calf or the thigh. Full range of motion of the joint.  No fevers.  Low suspicion for septic arthritis. Patient staffed with my attending, Dr. Leida Puna, who agrees with the plan of giving patient ACE wrap to help with swelling. Prescription of prednisone  sent to the pharmacy for swelling.   Social Determinants of Health  Tobacco use   Test / Admission - Considered  Patient is hemodynamically stable and safe for discharge home. Return precautions given.       Final Clinical Impression(s) / ED Diagnoses Final diagnoses:  Knee swelling    Rx / DC Orders ED Discharge Orders          Ordered    predniSONE  (DELTASONE ) 20 MG tablet  Daily        04/24/24 1708              Sonnie Dusky, PA-C 04/24/24 1727    Almond Army,  MD 04/27/24 [phone]

## 2024-04-24 NOTE — Discharge Instructions (Addendum)
 As discussed, your imaging is reassuring. Wear the ACE wrap to help with swelling. Take prednisone  40 mg for the next 5 days to help with the swelling as well. I have provided information for orthopedics to follow up with if swelling persists.  Get help right away if: Your leg turns pale, cool, or blue.

## 2024-05-06 ENCOUNTER — Encounter: Payer: Self-pay | Admitting: Cardiology

## 2024-05-06 ENCOUNTER — Ambulatory Visit: Attending: Cardiology | Admitting: Cardiology

## 2024-05-06 VITALS — BP 108/64 | HR 56 | Ht 72.0 in | Wt 175.0 lb

## 2024-05-06 DIAGNOSIS — I471 Supraventricular tachycardia, unspecified: Secondary | ICD-10-CM | POA: Diagnosis not present

## 2024-05-06 NOTE — Patient Instructions (Addendum)
 Medication Instructions:  Your physician recommends that you continue on your current medications as directed. Please refer to the Current Medication list given to you today.  *If you need a refill on your cardiac medications before your next appointment, please call your pharmacy*  Lab Work: You will need lab work with in 30 days of procedure  You may go to any [facility] Location for your lab work:  [facility] - [address] ([facility]) - [address] [phone] [address]  [place] - [address]   [place]  - [address]   [place] - [address] - [address] [employer]  - [address] [place] - [address] ([facility]   If you have labs (blood work) drawn today and your tests are completely normal, you will receive your results only by: [employer] (if you have MyChart)  If you have any lab test that is abnormal or we need to change your treatment, we will call you or send a MyChart message to review the results.  Testing/Procedures: SVT ablation to be scheduled for Sept 10th  Follow-Up: At [facility], you and your health needs are our priority.  As part of our continuing mission to provide you with exceptional heart care, we have created designated Provider Care Teams.  These Care Teams include your primary Cardiologist (physician) and Advanced Practice Providers (APPs -  Physician Assistants and Nurse Practitioners) who all work together to provide you with the care you need, when you need it.  Your next appointment:   To be scheduled  The format for your next appointment:   In Person  Provider:   Agatha Horsfall, MD{

## 2024-05-06 NOTE — Progress Notes (Unsigned)
  Electrophysiology Office Note:   Date:  05/06/2024  ID:  Paul [name], DOB Jul 19, 1956, MRN [id number]  Primary Cardiologist: Janelle Mediate, MD Primary Heart Failure: None Electrophysiologist: Jolly Needle, MD (Inactive)  {Click to update primary MD,subspecialty MD or APP then REFRESH:1}    History of Present Illness:   Paul [name] is a 68 y.o. male with h/o hyperlipidemia, SVT, elevated coronary calcium  score seen today for routine electrophysiology followup.   Since last being seen in our clinic the patient reports doing ***.  he denies chest pain, palpitations, dyspnea, PND, orthopnea, nausea, vomiting, dizziness, syncope, edema, weight gain, or early satiety.   Review of systems complete and found to be negative unless listed in HPI.   EP Information / Studies Reviewed:    EKG is ordered today. Personal review as below.        Risk Assessment/Calculations:         Physical Exam:   VS:  There were no vitals taken for this visit.   Wt Readings from Last 3 Encounters:  02/05/24 179 lb (81.2 kg)  01/29/24 177 lb 6.4 oz (80.5 kg)  12/15/23 179 lb 14.3 oz (81.6 kg)     GEN: Well nourished, well developed in no acute distress NECK: No JVD; No carotid bruits CARDIAC: {EPRHYTHM:28826}, no murmurs, rubs, gallops RESPIRATORY:  Clear to auscultation without rales, wheezing or rhonchi  ABDOMEN: Soft, non-tender, non-distended EXTREMITIES:  No edema; No deformity   ASSESSMENT AND PLAN:    1.  SVT: Has been on diltiazem  as needed and using vagal maneuvers.***  Follow up with {EPMDS:28135::"EP Team"} {EPFOLLOW JO:84166}  Signed, Deejay Koppelman Cortland Ding, MD

## 2024-05-07 ENCOUNTER — Encounter: Payer: Self-pay | Admitting: Cardiology

## 2024-05-07 DIAGNOSIS — G4733 Obstructive sleep apnea (adult) (pediatric): Secondary | ICD-10-CM | POA: Diagnosis not present

## 2024-05-11 DIAGNOSIS — G4733 Obstructive sleep apnea (adult) (pediatric): Secondary | ICD-10-CM | POA: Diagnosis not present

## 2024-05-11 DIAGNOSIS — F909 Attention-deficit hyperactivity disorder, unspecified type: Secondary | ICD-10-CM | POA: Diagnosis not present

## 2024-05-11 DIAGNOSIS — R413 Other amnesia: Secondary | ICD-10-CM | POA: Diagnosis not present

## 2024-05-11 DIAGNOSIS — G4710 Hypersomnia, unspecified: Secondary | ICD-10-CM | POA: Diagnosis not present

## 2024-05-12 ENCOUNTER — Other Ambulatory Visit: Payer: Self-pay

## 2024-05-12 ENCOUNTER — Other Ambulatory Visit: Payer: Self-pay | Admitting: Rheumatology

## 2024-05-12 DIAGNOSIS — Z79899 Other long term (current) drug therapy: Secondary | ICD-10-CM

## 2024-05-12 DIAGNOSIS — Z111 Encounter for screening for respiratory tuberculosis: Secondary | ICD-10-CM

## 2024-05-12 DIAGNOSIS — M0600 Rheumatoid arthritis without rheumatoid factor, unspecified site: Secondary | ICD-10-CM

## 2024-05-12 MED ORDER — ENBREL MINI 50 MG/ML ~~LOC~~ SOCT
50.0000 mg | SUBCUTANEOUS | 2 refills | Status: DC
  Filled 2024-05-12 – 2024-05-15 (×2): qty 4, 28d supply, fill #0
  Filled 2024-06-10: qty 4, 28d supply, fill #1
  Filled 2024-07-03: qty 4, 28d supply, fill #2

## 2024-05-12 NOTE — Telephone Encounter (Signed)
 Last Fill: 02/17/2024  Labs:  12/15/2023 RBC 4.15, HCT 38.9, BMP Glucose 117, Creatinine 1.32, GFR 60   TB Gold: 05/03/2023  TB gold negative    Next Visit: 06/30/2024  Last Visit: 01/29/2024   HQ:[id number] rheumatoid arthritis   Current Dose per office note 01/28/19: Enbrel  50 mg sq injections once weekly.   Patient advised he is due to update labs and will update them this week.   Okay to refill Enbrel ?

## 2024-05-13 DIAGNOSIS — Z79899 Other long term (current) drug therapy: Secondary | ICD-10-CM | POA: Diagnosis not present

## 2024-05-13 DIAGNOSIS — Z111 Encounter for screening for respiratory tuberculosis: Secondary | ICD-10-CM | POA: Diagnosis not present

## 2024-05-14 ENCOUNTER — Ambulatory Visit: Payer: Self-pay | Admitting: Rheumatology

## 2024-05-14 NOTE — Progress Notes (Signed)
CBC is normal, CMP shows mildly elevated glucose probably not a fasting sample.

## 2024-05-15 ENCOUNTER — Other Ambulatory Visit: Payer: Self-pay

## 2024-05-15 NOTE — Progress Notes (Signed)
 Specialty Pharmacy Refill Coordination Note  Paul [name] is a 68 y.o. male contacted today regarding refills of specialty medication(s) Etanercept  (Enbrel  Mini)   Patient requested Pickup at [facility] at [place] date: 05/18/24   Medication will be filled on 05/15/24.

## 2024-05-18 ENCOUNTER — Ambulatory Visit: Payer: Self-pay | Admitting: Physician Assistant

## 2024-05-18 LAB — CBC WITH DIFFERENTIAL/PLATELET
Basophils Absolute: 0 10*3/uL (ref 0.0–0.2)
Basos: 1 %
EOS (ABSOLUTE): 0.3 10*3/uL (ref 0.0–0.4)
Eos: 5 %
Hematocrit: 40.6 % (ref 37.5–51.0)
Hemoglobin: 13.3 g/dL (ref 13.0–17.7)
Immature Grans (Abs): 0 10*3/uL (ref 0.0–0.1)
Immature Granulocytes: 0 %
Lymphocytes Absolute: 1.7 10*3/uL (ref 0.7–3.1)
Lymphs: 29 %
MCH: 31.5 pg (ref 26.6–33.0)
MCHC: 32.8 g/dL (ref 31.5–35.7)
MCV: 96 fL (ref 79–97)
Monocytes Absolute: 0.7 10*3/uL (ref 0.1–0.9)
Monocytes: 11 %
Neutrophils Absolute: 3.3 10*3/uL (ref 1.4–7.0)
Neutrophils: 54 %
Platelets: 232 10*3/uL (ref 150–450)
RBC: 4.22 x10E6/uL (ref 4.14–5.80)
RDW: 12.3 % (ref 11.6–15.4)
WBC: 6.1 10*3/uL (ref 3.4–10.8)

## 2024-05-18 LAB — CMP14+EGFR
ALT: 19 IU/L (ref 0–44)
AST: 20 IU/L (ref 0–40)
Albumin: 4.1 g/dL (ref 3.9–4.9)
Alkaline Phosphatase: 91 IU/L (ref 44–121)
BUN/Creatinine Ratio: 16 (ref 10–24)
BUN: 17 mg/dL (ref 8–27)
Bilirubin Total: 0.3 mg/dL (ref 0.0–1.2)
CO2: 22 mmol/L (ref 20–29)
Calcium: 9.2 mg/dL (ref 8.6–10.2)
Chloride: 102 mmol/L (ref 96–106)
Creatinine, Ser: 1.08 mg/dL (ref 0.76–1.27)
Globulin, Total: 2.3 g/dL (ref 1.5–4.5)
Glucose: 135 mg/dL — ABNORMAL HIGH (ref 70–99)
Potassium: 4.4 mmol/L (ref 3.5–5.2)
Sodium: 139 mmol/L (ref 134–144)
Total Protein: 6.4 g/dL (ref 6.0–8.5)
eGFR: 76 mL/min/{1.73_m2} (ref >59)

## 2024-05-18 LAB — QUANTIFERON-TB GOLD PLUS
QuantiFERON Mitogen Value: >10 [IU]/mL
QuantiFERON Nil Value: 0.05 [IU]/mL
QuantiFERON TB1 Ag Value: 0.05 [IU]/mL
QuantiFERON TB2 Ag Value: 0.05 [IU]/mL
QuantiFERON-TB Gold Plus: NEGATIVE

## 2024-05-18 NOTE — Progress Notes (Signed)
 TB gold negative

## 2024-05-18 NOTE — Progress Notes (Signed)
 Glucose is elevated, probably not a fasting sample, CBC normal.  TB Gold negative.

## 2024-06-04 ENCOUNTER — Other Ambulatory Visit: Payer: Self-pay

## 2024-06-04 MED ORDER — DILTIAZEM HCL ER COATED BEADS 120 MG PO CP24: 120.0000 mg | ORAL_CAPSULE | Freq: Every day | ORAL | 3 refills | Status: DC

## 2024-06-09 ENCOUNTER — Other Ambulatory Visit (HOSPITAL_COMMUNITY): Payer: Self-pay

## 2024-06-09 ENCOUNTER — Encounter (HOSPITAL_COMMUNITY): Payer: Self-pay

## 2024-06-10 ENCOUNTER — Other Ambulatory Visit: Payer: Self-pay

## 2024-06-10 ENCOUNTER — Encounter (INDEPENDENT_AMBULATORY_CARE_PROVIDER_SITE_OTHER): Payer: Self-pay

## 2024-06-10 ENCOUNTER — Other Ambulatory Visit: Payer: Self-pay | Admitting: Pharmacy Technician

## 2024-06-10 DIAGNOSIS — F909 Attention-deficit hyperactivity disorder, unspecified type: Secondary | ICD-10-CM | POA: Diagnosis not present

## 2024-06-10 DIAGNOSIS — G4733 Obstructive sleep apnea (adult) (pediatric): Secondary | ICD-10-CM | POA: Diagnosis not present

## 2024-06-10 DIAGNOSIS — G4710 Hypersomnia, unspecified: Secondary | ICD-10-CM | POA: Diagnosis not present

## 2024-06-10 DIAGNOSIS — R413 Other amnesia: Secondary | ICD-10-CM | POA: Diagnosis not present

## 2024-06-10 NOTE — Progress Notes (Signed)
 Specialty Pharmacy Refill Coordination Note  Paul [name] is a 68 y.o. male contacted today regarding refills of specialty medication(s)  Etanercept  (Enbrel  Mini)     Patient requested (Patient-Rptd) Pickup at [facility] at [facility] date: (Patient-Rptd) 06/12/24   Medication will be filled on 06/11/24.
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
## 2024-06-30 ENCOUNTER — Ambulatory Visit: Payer: PPO | Attending: Physician Assistant | Admitting: Physician Assistant

## 2024-06-30 ENCOUNTER — Encounter: Payer: Self-pay | Admitting: Physician Assistant

## 2024-06-30 VITALS — BP 103/64 | HR 71 | Resp 16 | Ht 72.0 in | Wt 182.2 lb

## 2024-06-30 DIAGNOSIS — M19041 Primary osteoarthritis, right hand: Secondary | ICD-10-CM

## 2024-06-30 DIAGNOSIS — F419 Anxiety disorder, unspecified: Secondary | ICD-10-CM | POA: Diagnosis not present

## 2024-06-30 DIAGNOSIS — R5383 Other fatigue: Secondary | ICD-10-CM | POA: Diagnosis not present

## 2024-06-30 DIAGNOSIS — Z8659 Personal history of other mental and behavioral disorders: Secondary | ICD-10-CM | POA: Diagnosis not present

## 2024-06-30 DIAGNOSIS — M0600 Rheumatoid arthritis without rheumatoid factor, unspecified site: Secondary | ICD-10-CM | POA: Diagnosis not present

## 2024-06-30 DIAGNOSIS — Z8679 Personal history of other diseases of the circulatory system: Secondary | ICD-10-CM

## 2024-06-30 DIAGNOSIS — M19071 Primary osteoarthritis, right ankle and foot: Secondary | ICD-10-CM

## 2024-06-30 DIAGNOSIS — Z79899 Other long term (current) drug therapy: Secondary | ICD-10-CM

## 2024-06-30 DIAGNOSIS — Z87438 Personal history of other diseases of male genital organs: Secondary | ICD-10-CM

## 2024-06-30 DIAGNOSIS — M51369 Other intervertebral disc degeneration, lumbar region without mention of lumbar back pain or lower extremity pain: Secondary | ICD-10-CM

## 2024-06-30 DIAGNOSIS — M19042 Primary osteoarthritis, left hand: Secondary | ICD-10-CM

## 2024-06-30 DIAGNOSIS — Z8639 Personal history of other endocrine, nutritional and metabolic disease: Secondary | ICD-10-CM | POA: Diagnosis not present

## 2024-06-30 DIAGNOSIS — F32A Depression, unspecified: Secondary | ICD-10-CM

## 2024-06-30 DIAGNOSIS — R748 Abnormal levels of other serum enzymes: Secondary | ICD-10-CM

## 2024-06-30 DIAGNOSIS — M19072 Primary osteoarthritis, left ankle and foot: Secondary | ICD-10-CM

## 2024-06-30 DIAGNOSIS — M5030 Other cervical disc degeneration, unspecified cervical region: Secondary | ICD-10-CM

## 2024-06-30 NOTE — Patient Instructions (Signed)
 Standing Labs We placed an order today for your standing lab work.   Please have your standing labs drawn in mid-September and every 3 months  Please have your labs drawn 2 weeks prior to your appointment so that the provider can discuss your lab results at your appointment, if possible.  Please note that you may see your imaging and lab results in MyChart before we have reviewed them. We will contact you once all results are reviewed. Please allow our office up to 72 hours to thoroughly review all of the results before contacting the office for clarification of your results.  WALK-IN LAB HOURS  Monday through Thursday from 8:00 am -12:30 pm and 1:00 pm-4:30 pm and Friday from 8:00 am-12:00 pm.  Patients with office visits requiring labs will be seen before walk-in labs.  You may encounter longer than normal wait times. Please allow additional time. Wait times may be shorter on  Monday and Thursday afternoons.  We do not book appointments for walk-in labs. We appreciate your patience and understanding with our staff.   Labs are drawn by [facility]. Please bring your co-pay at the time of your lab draw.  You may receive a bill from [facility] for your lab work.  Please note if you are on Hydroxychloroquine and and an order has been placed for a Hydroxychloroquine level,  you will need to have it drawn 4 hours or more after your last dose.  If you wish to have your labs drawn at another location, please call the office 24 hours in advance so we can fax the orders.  The office is located at [address], [address], [place], [place] [address]   If you have any questions regarding directions or hours of operation,  please call [phone].   As a reminder, please drink plenty of water prior to coming for your lab work. Thanks!

## 2024-07-03 ENCOUNTER — Other Ambulatory Visit: Payer: Self-pay

## 2024-07-03 NOTE — Progress Notes (Signed)
 Specialty Pharmacy Refill Coordination Note  Paul [name] is a 68 y.o. male contacted today regarding refills of specialty medication(s) Etanercept  (Enbrel  Mini)   Patient requested [place] at [facility] at [place] date: 07/10/24   Medication will be filled on 08.07.25.

## 2024-07-11 DIAGNOSIS — F909 Attention-deficit hyperactivity disorder, unspecified type: Secondary | ICD-10-CM | POA: Diagnosis not present

## 2024-07-11 DIAGNOSIS — G4710 Hypersomnia, unspecified: Secondary | ICD-10-CM | POA: Diagnosis not present

## 2024-07-11 DIAGNOSIS — G4733 Obstructive sleep apnea (adult) (pediatric): Secondary | ICD-10-CM | POA: Diagnosis not present

## 2024-07-11 DIAGNOSIS — R413 Other amnesia: Secondary | ICD-10-CM | POA: Diagnosis not present

## 2024-07-15 ENCOUNTER — Other Ambulatory Visit: Payer: Self-pay

## 2024-07-15 DIAGNOSIS — I471 Supraventricular tachycardia, unspecified: Secondary | ICD-10-CM

## 2024-07-24 ENCOUNTER — Encounter: Payer: Self-pay | Admitting: Cardiology

## 2024-07-27 DIAGNOSIS — G4733 Obstructive sleep apnea (adult) (pediatric): Secondary | ICD-10-CM | POA: Diagnosis not present

## 2024-07-28 DIAGNOSIS — I471 Supraventricular tachycardia, unspecified: Secondary | ICD-10-CM | POA: Diagnosis not present

## 2024-07-29 LAB — BASIC METABOLIC PANEL WITH GFR
BUN/Creatinine Ratio: 16 (ref 10–24)
BUN: 18 mg/dL (ref 8–27)
CO2: 24 mmol/L (ref 20–29)
Calcium: 9.6 mg/dL (ref 8.6–10.2)
Chloride: 102 mmol/L (ref 96–106)
Creatinine, Ser: 1.13 mg/dL (ref 0.76–1.27)
Glucose: 110 mg/dL — ABNORMAL HIGH (ref 70–99)
Potassium: 5 mmol/L (ref 3.5–5.2)
Sodium: 140 mmol/L (ref 134–144)
eGFR: 72 mL/min/1.73 (ref >59)

## 2024-07-29 LAB — CBC
Hematocrit: 41.1 % (ref 37.5–51.0)
Hemoglobin: 13.9 g/dL (ref 13.0–17.7)
MCH: 32 pg (ref 26.6–33.0)
MCHC: 33.8 g/dL (ref 31.5–35.7)
MCV: 95 fL (ref 79–97)
Platelets: 234 x10E3/uL (ref 150–450)
RBC: 4.35 x10E6/uL (ref 4.14–5.80)
RDW: 12.3 % (ref 11.6–15.4)
WBC: 5.9 x10E3/uL (ref 3.4–10.8)

## 2024-07-30 ENCOUNTER — Other Ambulatory Visit: Payer: Self-pay

## 2024-07-30 ENCOUNTER — Other Ambulatory Visit (HOSPITAL_COMMUNITY): Payer: Self-pay

## 2024-07-30 ENCOUNTER — Encounter (INDEPENDENT_AMBULATORY_CARE_PROVIDER_SITE_OTHER): Payer: Self-pay

## 2024-07-30 ENCOUNTER — Other Ambulatory Visit: Payer: Self-pay | Admitting: Physician Assistant

## 2024-07-30 DIAGNOSIS — M0600 Rheumatoid arthritis without rheumatoid factor, unspecified site: Secondary | ICD-10-CM

## 2024-07-30 MED ORDER — ENBREL MINI 50 MG/ML ~~LOC~~ SOCT
50.0000 mg | SUBCUTANEOUS | 2 refills | Status: DC
Start: 2024-07-30 — End: 2024-10-27
  Filled 2024-07-30: qty 4, 28d supply, fill #0
  Filled 2024-09-02: qty 4, 28d supply, fill #1
  Filled 2024-09-28 – 2024-09-30 (×2): qty 4, 28d supply, fill #2

## 2024-07-30 NOTE — Telephone Encounter (Signed)
 Last Fill: 05/12/2024  Labs: 07/28/2024 Glucose 110  Rest of CBC an BMP WNL 05/13/2024 CMP  CBC is normal, CMP shows mildly elevated glucose probably not a fasting sample.   TB Gold: 6/11/202025 TB Gold Negative   Next Visit: 12/15/2024  Last Visit: 06/30/2024  DX: Seronegative rheumatoid arthritis   Current Dose per office note 06/30/2024: Enbrel  50 mg sq injections once weekly   Okay to refill Enbrel ?

## 2024-07-30 NOTE — Progress Notes (Signed)
 Specialty Pharmacy Refill Coordination Note  Paul [name] is a 68 y.o. male contacted today regarding refills of specialty medication(s) Etanercept  (Enbrel  Mini)   Patient requested (Patient-Rptd) Pickup at [facility] date: (Patient-Rptd) 08/06/24   Medication will be filled on 08/05/24, pending refill approval.

## 2024-07-31 ENCOUNTER — Other Ambulatory Visit: Payer: Self-pay

## 2024-08-04 DIAGNOSIS — R7303 Prediabetes: Secondary | ICD-10-CM | POA: Diagnosis not present

## 2024-08-04 DIAGNOSIS — Z0000 Encounter for general adult medical examination without abnormal findings: Secondary | ICD-10-CM | POA: Diagnosis not present

## 2024-08-04 DIAGNOSIS — F341 Dysthymic disorder: Secondary | ICD-10-CM | POA: Diagnosis not present

## 2024-08-04 DIAGNOSIS — Z1331 Encounter for screening for depression: Secondary | ICD-10-CM | POA: Diagnosis not present

## 2024-08-04 DIAGNOSIS — E785 Hyperlipidemia, unspecified: Secondary | ICD-10-CM | POA: Diagnosis not present

## 2024-08-04 DIAGNOSIS — R931 Abnormal findings on diagnostic imaging of heart and coronary circulation: Secondary | ICD-10-CM | POA: Diagnosis not present

## 2024-08-04 DIAGNOSIS — G4700 Insomnia, unspecified: Secondary | ICD-10-CM | POA: Diagnosis not present

## 2024-08-04 DIAGNOSIS — N400 Enlarged prostate without lower urinary tract symptoms: Secondary | ICD-10-CM | POA: Diagnosis not present

## 2024-08-04 DIAGNOSIS — I471 Supraventricular tachycardia, unspecified: Secondary | ICD-10-CM | POA: Diagnosis not present

## 2024-08-04 DIAGNOSIS — E039 Hypothyroidism, unspecified: Secondary | ICD-10-CM | POA: Diagnosis not present

## 2024-08-04 DIAGNOSIS — M0600 Rheumatoid arthritis without rheumatoid factor, unspecified site: Secondary | ICD-10-CM | POA: Diagnosis not present

## 2024-08-05 ENCOUNTER — Encounter (HOSPITAL_COMMUNITY): Payer: Self-pay

## 2024-08-10 ENCOUNTER — Other Ambulatory Visit (HOSPITAL_COMMUNITY): Payer: Self-pay

## 2024-08-11 DIAGNOSIS — G4710 Hypersomnia, unspecified: Secondary | ICD-10-CM | POA: Diagnosis not present

## 2024-08-11 DIAGNOSIS — G4733 Obstructive sleep apnea (adult) (pediatric): Secondary | ICD-10-CM | POA: Diagnosis not present

## 2024-08-11 DIAGNOSIS — R413 Other amnesia: Secondary | ICD-10-CM | POA: Diagnosis not present

## 2024-08-11 DIAGNOSIS — F909 Attention-deficit hyperactivity disorder, unspecified type: Secondary | ICD-10-CM | POA: Diagnosis not present

## 2024-08-11 NOTE — Pre-Procedure Instructions (Signed)
 Attempted to call patient regarding procedure instructions.  Left voicemail on the following items: Arrival time 0900- new arrival time Nothing to eat or drink after midnight No meds AM of procedure Responsible person to drive you home and stay with you for 24 hrs

## 2024-08-12 ENCOUNTER — Ambulatory Visit (HOSPITAL_COMMUNITY): Admitting: Anesthesiology

## 2024-08-12 ENCOUNTER — Ambulatory Visit (HOSPITAL_COMMUNITY)
Admission: RE | Admit: 2024-08-12 | Discharge: 2024-08-12 | Disposition: A | Discharge status: Home / Self Care | Attending: Cardiology | Admitting: Cardiology

## 2024-08-12 ENCOUNTER — Ambulatory Visit (HOSPITAL_COMMUNITY)
Admission: RE | Disposition: A | Discharge status: Home / Self Care | Payer: Self-pay | Source: Home / Self Care | Attending: Cardiology

## 2024-08-12 ENCOUNTER — Other Ambulatory Visit: Payer: Self-pay

## 2024-08-12 DIAGNOSIS — E785 Hyperlipidemia, unspecified: Secondary | ICD-10-CM | POA: Insufficient documentation

## 2024-08-12 DIAGNOSIS — E039 Hypothyroidism, unspecified: Secondary | ICD-10-CM

## 2024-08-12 DIAGNOSIS — Z79899 Other long term (current) drug therapy: Secondary | ICD-10-CM | POA: Insufficient documentation

## 2024-08-12 DIAGNOSIS — Z87891 Personal history of nicotine dependence: Secondary | ICD-10-CM | POA: Diagnosis not present

## 2024-08-12 DIAGNOSIS — R7303 Prediabetes: Secondary | ICD-10-CM | POA: Insufficient documentation

## 2024-08-12 DIAGNOSIS — I471 Supraventricular tachycardia, unspecified: Secondary | ICD-10-CM

## 2024-08-12 DIAGNOSIS — F32A Depression, unspecified: Secondary | ICD-10-CM | POA: Diagnosis not present

## 2024-08-12 DIAGNOSIS — F419 Anxiety disorder, unspecified: Secondary | ICD-10-CM | POA: Diagnosis not present

## 2024-08-12 DIAGNOSIS — E119 Type 2 diabetes mellitus without complications: Secondary | ICD-10-CM | POA: Diagnosis not present

## 2024-08-12 DIAGNOSIS — I2510 Atherosclerotic heart disease of native coronary artery without angina pectoris: Secondary | ICD-10-CM

## 2024-08-12 DIAGNOSIS — I4719 Other supraventricular tachycardia: Secondary | ICD-10-CM | POA: Diagnosis not present

## 2024-08-12 HISTORY — PX: SVT ABLATION: EP1225

## 2024-08-12 LAB — NO BLOOD PRODUCTS

## 2024-08-12 MED ORDER — PHENYLEPHRINE HCL-NACL 20-0.9 MG/250ML-% IV SOLN
INTRAVENOUS | Status: AC
  Filled 2024-08-12: qty 250

## 2024-08-12 MED ORDER — SODIUM CHLORIDE 0.9 % IV SOLN: INTRAVENOUS | Status: DC

## 2024-08-12 MED ORDER — ONDANSETRON HCL 4 MG/2ML IJ SOLN: 4.0000 mg | Freq: Four times a day (QID) | INTRAMUSCULAR | Status: DC | PRN

## 2024-08-12 MED ORDER — MIDAZOLAM HCL 2 MG/2ML IJ SOLN
INTRAMUSCULAR | Status: AC
  Filled 2024-08-12: qty 2

## 2024-08-12 MED ORDER — FENTANYL CITRATE (PF) 100 MCG/2ML IJ SOLN
INTRAMUSCULAR | Status: DC | PRN
  Administered 2024-08-12 (×2): 25 ug via INTRAVENOUS
  Administered 2024-08-12: 50 ug via INTRAVENOUS

## 2024-08-12 MED ORDER — PROPOFOL 1000 MG/100ML IV EMUL
INTRAVENOUS | Status: AC
  Filled 2024-08-12: qty 100

## 2024-08-12 MED ORDER — ACETAMINOPHEN 325 MG PO TABS: 650.0000 mg | ORAL_TABLET | ORAL | Status: DC | PRN

## 2024-08-12 MED ORDER — FENTANYL CITRATE (PF) 100 MCG/2ML IJ SOLN
INTRAMUSCULAR | Status: AC
  Filled 2024-08-12: qty 2

## 2024-08-12 MED ORDER — MIDAZOLAM HCL 2 MG/2ML IJ SOLN
INTRAMUSCULAR | Status: DC | PRN
  Administered 2024-08-12 (×2): 1 mg via INTRAVENOUS

## 2024-08-12 MED ORDER — PROPOFOL 500 MG/50ML IV EMUL
INTRAVENOUS | Status: DC | PRN
  Administered 2024-08-12: 100 ug/kg/min via INTRAVENOUS

## 2024-08-12 MED ORDER — SODIUM CHLORIDE 0.9% FLUSH: 3.0000 mL | INTRAVENOUS | Status: DC | PRN

## 2024-08-12 MED ORDER — EPHEDRINE SULFATE-NACL 50-0.9 MG/10ML-% IV SOSY
PREFILLED_SYRINGE | INTRAVENOUS | Status: DC | PRN
  Administered 2024-08-12: 5 mg via INTRAVENOUS

## 2024-08-12 MED ORDER — SODIUM CHLORIDE 0.9% FLUSH: 3.0000 mL | Freq: Two times a day (BID) | INTRAVENOUS | Status: DC

## 2024-08-12 MED ORDER — SODIUM CHLORIDE 0.9 % IV SOLN: 250.0000 mL | INTRAVENOUS | Status: DC | PRN

## 2024-08-12 MED ORDER — PROPOFOL 10 MG/ML IV BOLUS
INTRAVENOUS | Status: DC | PRN
  Administered 2024-08-12: 30 mg via INTRAVENOUS

## 2024-08-12 MED ORDER — BUPIVACAINE HCL (PF) 0.25 % IJ SOLN
INTRAMUSCULAR | Status: AC
  Filled 2024-08-12: qty 30

## 2024-08-12 SURGICAL SUPPLY — 10 items
CATH EZ STEER NAV 4MM D-F CUR (ABLATOR) IMPLANT
CATH JOSEPH QUAD ALLRED 6F REP (CATHETERS) IMPLANT
CATH WEB BI DIR CSDF CRV REPRO (CATHETERS) IMPLANT
CLOSURE MYNX CONTROL 6F/7F (Vascular Products) IMPLANT
PACK EP LF (CUSTOM PROCEDURE TRAY) ×1 IMPLANT
PAD DEFIB RADIO PHYSIO CONN (PAD) ×1 IMPLANT
PATCH CARTO3 (PAD) IMPLANT
SHEATH PINNACLE 6F 10CM (SHEATH) IMPLANT
SHEATH PINNACLE 7F 10CM (SHEATH) IMPLANT
SHEATH PINNACLE 8F 10CM (SHEATH) IMPLANT

## 2024-08-12 NOTE — H&P (Signed)
  Electrophysiology Office Note:   Date:  08/12/2024  ID:  Paul [name], DOB 08-10-1956, MRN [id number]  Primary Cardiologist: Maude Emmer, MD Primary Heart Failure: None Electrophysiologist: Arnice Vanepps [name], MD      History of Present Illness:   Paul [name] is a 68 y.o. male with h/o hyperlipidemia, SVT, elevated coronary calcium  score seen today for routine electrophysiology followup.   Today, denies symptoms of palpitations, chest pain, dyspnea, orthopnea, PND, lower extremity edema, claudication, dizziness, presyncope, syncope, bleeding, or neurologic sequela. The patient is tolerating medications without difficulties. Plan ablation today.   EP Information / Studies Reviewed:    EKG is ordered today. Personal review as below.        Risk Assessment/Calculations:         Physical Exam:   VS:  There were no vitals taken for this visit.   Wt Readings from Last 3 Encounters:  06/30/24 82.6 kg  05/06/24 79.4 kg  02/05/24 81.2 kg    GEN: Well nourished, well developed in no acute distress NECK: No JVD; No carotid bruits CARDIAC: Regular rate and rhythm, no murmurs, rubs, gallops RESPIRATORY:  Clear to auscultation without rales, wheezing or rhonchi  ABDOMEN: Soft, non-tender, non-distended EXTREMITIES:  No edema; No deformity    ASSESSMENT AND PLAN:    1.  SVT: Paul [name] has presented today for surgery, with the diagnosis of AF.  The various methods of treatment have been discussed with the patient and family. After consideration of risks, benefits and other options for treatment, the patient has consented to  Procedure(s): Catheter ablation as a surgical intervention .  Risks include but not limited to complete heart block, stroke, esophageal damage, nerve damage, bleeding, vascular damage, tamponade, perforation, MI, and death. The patient's history has been reviewed, patient examined, no change in status, stable for surgery.  I have reviewed the  patient's chart and labs.  Questions were answered to the patient's satisfaction.    Avalyn Molino Norton, MD 08/12/2024 10:30 AM

## 2024-08-12 NOTE — Transfer of Care (Signed)
 Immediate Anesthesia Transfer of Care Note  Patient: Paul [name]  Procedure(s) Performed: SVT ABLATION  Patient Location: Cath Lab  Anesthesia Type:MAC  Level of Consciousness: awake, alert , oriented, and patient cooperative  Airway & Oxygen Therapy: Patient Spontanous Breathing  Post-op Assessment: Report given to RN and Post -op Vital signs reviewed and stable  Post vital signs: Reviewed and stable  Last Vitals:  Vitals Value Taken Time  BP    Temp    Pulse    Resp    SpO2      Last Pain:  Vitals:   08/12/24 1045  TempSrc: Oral  PainSc:       Patients Stated Pain Goal: 3 (08/12/24 1040)  Complications: There were no known notable events for this encounter.

## 2024-08-12 NOTE — Discharge Instructions (Signed)

## 2024-08-12 NOTE — Anesthesia Preprocedure Evaluation (Addendum)
 Anesthesia Evaluation  Patient identified by MRN, date of birth, ID band Patient awake    Reviewed: Allergy & Precautions, NPO status , Patient's Chart, lab work & pertinent test results  Airway Mallampati: II  TM Distance: >3 FB Neck ROM: Full    Dental  (+) Teeth Intact, Dental Advisory Given   Pulmonary sleep apnea and Continuous Positive Airway Pressure Ventilation , former smoker   Pulmonary exam normal breath sounds clear to auscultation       Cardiovascular Normal cardiovascular exam+ dysrhythmias Supra Ventricular Tachycardia  Rhythm:Regular Rate:Normal  TTE 2024 1. Left ventricular ejection fraction, by estimation, is 60 to 65%. The  left ventricle has normal function. The left ventricle has no regional  wall motion abnormalities. Left ventricular diastolic parameters are  consistent with Grade I diastolic  dysfunction (impaired relaxation).   2. Right ventricular systolic function is normal. The right ventricular  size is normal. Tricuspid regurgitation signal is inadequate for assessing  PA pressure.   3. The mitral valve is grossly normal. Trivial mitral valve  regurgitation. No evidence of mitral stenosis.   4. The aortic valve is tricuspid. Aortic valve regurgitation is not  visualized. No aortic stenosis is present.   5. The inferior vena cava is normal in size with greater than 50%  respiratory variability, suggesting right atrial pressure of 3 mmHg.   Cath 2024 1. No significant CAD. Mild calcification in the proximal LAD 2. Normal LV function 3. Normal LVEDP     Neuro/Psych  PSYCHIATRIC DISORDERS Anxiety Depression    negative neurological ROS     GI/Hepatic negative GI ROS, Neg liver ROS,,,  Endo/Other  diabetes (prediabetes)Hypothyroidism    Renal/GU negative Renal ROS  negative genitourinary   Musculoskeletal  (+) Arthritis , Rheumatoid disorders,    Abdominal   Peds  Hematology  (+)  REFUSES BLOOD PRODUCTS (will accept albumin)  Anesthesia Other Findings   Reproductive/Obstetrics                              Anesthesia Physical Anesthesia Plan  ASA: 3  Anesthesia Plan: MAC   Post-op Pain Management: Minimal or no pain anticipated   Induction: Intravenous  PONV Risk Score and Plan: 1 and Propofol  infusion and Treatment may vary due to age or medical condition  Airway Management Planned: Natural Airway  Additional Equipment:   Intra-op Plan:   Post-operative Plan:   Informed Consent: I have reviewed the patients History and Physical, chart, labs and discussed the procedure including the risks, benefits and alternatives for the proposed anesthesia with the patient or authorized representative who has indicated his/her understanding and acceptance.     Dental advisory given  Plan Discussed with: CRNA  Anesthesia Plan Comments:          Anesthesia Quick Evaluation

## 2024-08-12 NOTE — Progress Notes (Signed)
 Patient ambulated to BR. Bilateral groins are unremarkable.

## 2024-08-13 ENCOUNTER — Telehealth (HOSPITAL_COMMUNITY): Payer: Self-pay

## 2024-08-13 ENCOUNTER — Encounter (HOSPITAL_COMMUNITY): Payer: Self-pay | Admitting: Cardiology

## 2024-08-13 ENCOUNTER — Encounter: Payer: Self-pay | Admitting: Cardiology

## 2024-08-13 MED FILL — Bupivacaine HCl Preservative Free (PF) Inj 0.25%: INTRAMUSCULAR | Qty: 30 | Status: AC

## 2024-08-13 NOTE — Telephone Encounter (Signed)
 Spoke with patient's wife Paul [name] to complete post procedure follow up call/ DPR on file. She reports no complications with groin sites.   Instructions reviewed:  Remove large bandage at puncture site after 24 hours. It is normal to have bruising, tenderness, mild swelling, and a pea or marble sized lump/knot at the groin site which can take up to three months to resolve.  Get help right away if you notice sudden swelling at the puncture site.  Check your puncture site every day for signs of infection: fever, redness, swelling, pus drainage, warmth, foul odor or excessive pain. If this occurs, please call [phone], to speak with the RN Navigator. Get help right away if your puncture site is bleeding and the bleeding does not stop after applying firm pressure to the area.  You may continue to have skipped beats during the first several months after your procedure.   You will follow up with the APP on 09/11/24.   Patient's wife verbalized understanding to all instructions provided and appreciated follow up call.

## 2024-08-18 NOTE — Anesthesia Postprocedure Evaluation (Signed)
 Anesthesia Post Note  Patient: Paul [name]  Procedure(s) Performed: SVT ABLATION     Patient location during evaluation: PACU Anesthesia Type: MAC Level of consciousness: awake and alert Pain management: pain level controlled Vital Signs Assessment: post-procedure vital signs reviewed and stable Respiratory status: spontaneous breathing, nonlabored ventilation and respiratory function stable Cardiovascular status: blood pressure returned to baseline and stable Postop Assessment: no apparent nausea or vomiting Anesthetic complications: no   There were no known notable events for this encounter.                 Boen Sterbenz

## 2024-08-31 ENCOUNTER — Other Ambulatory Visit: Payer: Self-pay

## 2024-09-01 ENCOUNTER — Ambulatory Visit: Payer: PPO | Admitting: Adult Health

## 2024-09-02 ENCOUNTER — Other Ambulatory Visit: Payer: Self-pay

## 2024-09-02 ENCOUNTER — Encounter (INDEPENDENT_AMBULATORY_CARE_PROVIDER_SITE_OTHER): Payer: Self-pay

## 2024-09-02 NOTE — Progress Notes (Signed)
 Specialty Pharmacy Refill Coordination Note  Paul [name] is a 68 y.o. male contacted today regarding refills of specialty medication(s) Etanercept  (Enbrel  Mini)   Patient requested Pickup at [facility] at [place] date: 09/04/24   Medication will be filled on 10.02.25.

## 2024-09-10 ENCOUNTER — Telehealth: Payer: Self-pay | Admitting: Pharmacist

## 2024-09-10 NOTE — Telephone Encounter (Signed)
 Patient's wife reached out about reapplying for grant to me via email. No RA grants are opened. She does believe they would qualify for [employer] patient's assistance program. Patient previously receiving Enbrel  through [employer] but did not qualify last year due to slight income increase.  Patient forms have been mailed to patient's home.  Prescriber form will be completed after return of patient forms.  Sherry Pennant, PharmD, MPH, BCPS, CPP Clinical Pharmacist [facility])

## 2024-09-10 NOTE — Progress Notes (Unsigned)
  Electrophysiology Office Note:   Date:  09/10/2024  ID:  LUNDON VERDEJO, DOB 03-05-1956, MRN [id number]  Primary Cardiologist: Maude Emmer, MD Primary Heart Failure: None Electrophysiologist: Will [name], MD  {Click to update primary MD,subspecialty MD or APP then REFRESH:1}    History of Present Illness:   Paul [name] is a 68 y.o. male with h/o SVT, CAD, HLD, hypothyroidism  seen today for routine electrophysiology follow-up s/p Ablation.  Since last being seen in our clinic the patient reports doing ***.    He***denies chest pain, palpitations, dyspnea, PND, orthopnea, nausea, vomiting, dizziness, syncope, edema, weight gain, or early satiety.    Review of systems complete and found to be negative unless listed in HPI.   EP Information / Studies Reviewed:    EKG is ordered today. Personal review as below.       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  REFRESH note before signing. :1} This indicates a 2.2% annual risk of stroke. The patient's score is based upon: CHF History: 0 HTN History: 0 Diabetes History: 0 Stroke History: 0 Vascular Disease History: 1 Age Score: 1 Gender Score: 0   {This patient has a significant risk of stroke if diagnosed with atrial fibrillation.  Please consider VKA or DOAC agent for anticoagulation if the bleeding risk is acceptable.   You can also use the SmartPhrase .[email or web address] for documentation.   :[id number]} No BP recorded.  {Refresh Note OR Click here to enter BP  :1}***        Physical Exam:   VS:  There were no vitals taken for this visit.   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 Norton {EPFOLLOW LE:71826}  Signed, Daphne Barrack, NP-C, AGACNP-BC [facility] - Electrophysiology  09/10/2024, 9:06 PM

## 2024-09-11 ENCOUNTER — Encounter: Payer: Self-pay | Admitting: Pulmonary Disease

## 2024-09-11 ENCOUNTER — Ambulatory Visit: Attending: Pulmonary Disease | Admitting: Pulmonary Disease

## 2024-09-11 VITALS — BP 108/64 | HR 63 | Ht 72.0 in | Wt 179.0 lb

## 2024-09-11 DIAGNOSIS — I471 Supraventricular tachycardia, unspecified: Secondary | ICD-10-CM

## 2024-09-11 NOTE — Patient Instructions (Signed)
 Medication Instructions:   Your physician recommends that you continue on your current medications as directed. Please refer to the Current Medication list given to you today.   *If you need a refill on your cardiac medications before your next appointment, please call your pharmacy*  Lab Work: NONE ORDERED  TODAY    If you have labs (blood work) drawn today and your tests are completely normal, you will receive your results only by: MyChart Message (if you have MyChart) OR A paper copy in the mail If you have any lab test that is abnormal or we need to change your treatment, we will call you to review the results.  Testing/Procedures: NONE ORDERED  TODAY    Follow-Up: At [facility], you and your health needs are our priority.  As part of our continuing mission to provide you with exceptional heart care, our providers are all part of one team.  This team includes your primary Cardiologist (physician) and Advanced Practice Providers or APPs (Physician Assistants and Nurse Practitioners) who all work together to provide you with the care you need, when you need it.  Your next appointment:   1 year(s)  Provider:   Daphne Barrack, NP    We recommend signing up for the patient portal called MyChart.  Sign up information is provided on this After Visit Summary.  MyChart is used to connect with patients for Virtual Visits (Telemedicine).  Patients are able to view lab/test results, encounter notes, upcoming appointments, etc.  Non-urgent messages can be sent to your provider as well.   To learn more about what you can do with MyChart, go to [email or web address].   Other Instructions

## 2024-09-21 ENCOUNTER — Encounter: Payer: Self-pay | Admitting: Physical Medicine and Rehabilitation

## 2024-09-22 ENCOUNTER — Other Ambulatory Visit: Payer: Self-pay | Admitting: Physical Medicine and Rehabilitation

## 2024-09-22 DIAGNOSIS — M47816 Spondylosis without myelopathy or radiculopathy, lumbar region: Secondary | ICD-10-CM

## 2024-09-22 DIAGNOSIS — M545 Low back pain, unspecified: Secondary | ICD-10-CM

## 2024-09-23 DIAGNOSIS — Z1212 Encounter for screening for malignant neoplasm of rectum: Secondary | ICD-10-CM | POA: Diagnosis not present

## 2024-09-23 DIAGNOSIS — Z1211 Encounter for screening for malignant neoplasm of colon: Secondary | ICD-10-CM | POA: Diagnosis not present

## 2024-09-28 ENCOUNTER — Other Ambulatory Visit (HOSPITAL_COMMUNITY): Payer: Self-pay

## 2024-09-30 ENCOUNTER — Other Ambulatory Visit: Payer: Self-pay

## 2024-09-30 ENCOUNTER — Other Ambulatory Visit: Payer: Self-pay | Admitting: Pharmacy Technician

## 2024-09-30 NOTE — Progress Notes (Signed)
 Specialty Pharmacy Refill Coordination Note  Paul [name] is a 68 y.o. male contacted today regarding refills of specialty medication(s) Etanercept  (Enbrel  Mini)   Patient requested Pickup at [facility] at [place] date: 10/01/24   Medication will be filled on: 09/30/24

## 2024-10-01 NOTE — Telephone Encounter (Signed)
 Received signed patient forms. Prescriber form placed in Dr. Jammie folder for signature  Submitted a Prior Authorization RENEWAL request to [facility] ADVANTAGE/RX ADVANCE for ENBREL  via CoverMyMeds. Will update once we receive a response.  Key: [id number]   Per automated response: Prior Authorization duplicate/approved  Sherry Pennant, PharmD, MPH, BCPS, CPP Clinical Pharmacist [facility])

## 2024-10-05 ENCOUNTER — Encounter: Payer: Self-pay | Admitting: Radiology

## 2024-10-05 NOTE — Telephone Encounter (Signed)
 Received signed provider form. Submission will be after 10/26/24 (once new enrollment form is available on [employer]'s site)

## 2024-10-13 ENCOUNTER — Other Ambulatory Visit: Payer: Self-pay

## 2024-10-13 ENCOUNTER — Ambulatory Visit: Admitting: Physical Medicine and Rehabilitation

## 2024-10-13 VITALS — BP 129/70 | HR 64

## 2024-10-13 DIAGNOSIS — M47816 Spondylosis without myelopathy or radiculopathy, lumbar region: Secondary | ICD-10-CM

## 2024-10-13 MED ORDER — METHYLPREDNISOLONE ACETATE 40 MG/ML IJ SUSP
40.0000 mg | Freq: Once | INTRAMUSCULAR | Status: AC
  Administered 2024-10-13: 40 mg

## 2024-10-13 NOTE — Progress Notes (Signed)
 Pain Scale   Average Pain 5 Patient advising he has lower back pain that is constant and he doesn't get any relief.        +Driver, -BT, -Dye Allergies.

## 2024-10-19 ENCOUNTER — Encounter: Payer: Self-pay | Admitting: Cardiovascular Disease

## 2024-10-20 ENCOUNTER — Telehealth: Payer: Self-pay | Admitting: Pharmacy Technician

## 2024-10-20 NOTE — Telephone Encounter (Signed)
   Will mail application 10/21/24 when in office

## 2024-10-20 NOTE — Procedures (Signed)
 Lumbar Facet Joint Nerve Denervation  Patient: Paul [name]      Date of Birth: [birth year] MRN: [id number] PCP: Ransom Other, MD      Visit Date: 10/13/2024   Universal Protocol:    Date/Time: 11/18/256:56 PM  Consent Given By: the patient  Position: PRONE  Additional Comments: Vital signs were monitored before and after the procedure. Patient was prepped and draped in the usual sterile fashion. The correct patient, procedure, and site was verified.   Injection Procedure Details:   Procedure diagnoses:  1. Spondylosis without myelopathy or radiculopathy, lumbar region      Meds Administered:  Meds ordered this encounter  Medications   methylPREDNISolone  acetate (DEPO-MEDROL ) injection 40 mg     Laterality: Right  Location/Site:  L2-L3, L1 and L2 medial branches and L3-L4, L2 and L3 medial branches  Needle: 18 ga.,  10mm active tip, RF Cannula  Needle Placement: Along juncture of superior articular process and transverse pocess  Findings:  -Comments:  Procedure Details: For each desired target nerve, the corresponding transverse process (sacral ala for the L5 dorsal rami) was identified and the fluoroscope was positioned to square off the endplates of the corresponding vertebral body to achieve a true AP midline view.  The beam was then obliqued 15 to 20 degrees and caudally tilted 15 to 20 degrees to line up a trajectory along the target nerves. The skin over the target of the junction of superior articulating process and transverse process (sacral ala for the L5 dorsal rami) was infiltrated with 1ml of 1% Lidocaine  without Epinephrine.  The 18 gauge 10mm active tip outer cannula was advanced in trajectory view to the target.  This procedure was repeated for each target nerve.  Then, for all levels, the outer cannula placement was fine-tuned and the position was then confirmed with bi-planar imaging.    Test stimulation was done both at sensory and motor  levels to ensure there was no radicular stimulation. The target tissues were then infiltrated with 1 ml of 1% Lidocaine  without Epinephrine. Subsequently, a percutaneous neurotomy was carried out for 90 seconds at 80 degrees Celsius.  After the completion of the lesion, 1 ml of injectate was delivered. It was then repeated for each facet joint nerve mentioned above. Appropriate radiographs were obtained to verify the probe placement during the neurotomy.   Additional Comments:  No complications occurred Dressing: 2 x 2 sterile gauze and Band-Aid    Post-procedure details: Patient was observed during the procedure. Post-procedure instructions were reviewed.  Patient left the clinic in stable condition.

## 2024-10-20 NOTE — Progress Notes (Signed)
 Paul [name] - 68 y.o. male MRN [id number]  Date of birth: 23-Jun-1956  Office Visit Note: Visit Date: 10/13/2024 PCP: Ransom Other, MD Referred by: Ransom Other, MD  Subjective: Chief Complaint  Patient presents with   Lower Back - Pain   HPI:  Paul [name] is a 68 y.o. male who comes in todayfor planned repeat radiofrequency ablation of the Right L2-3 and L3-4  Lumbar facet joints. This would be ablation of the corresponding medial branches and/or dorsal rami.  Patient has had double diagnostic blocks with more than 70% relief.  Subsequent ablation gave them more than 6 months of over 60% relief.  They have had chronic back pain for quite some time, more than 3 months, which has been an ongoing situation with recalcitrant axial back pain.  They have no radicular pain.  Their axial pain is worse with standing and ambulating and on exam today with facet loading.  They have had physical therapy as well as home exercise program.  The imaging noted in the chart below indicated facet pathology. Accordingly they meet all the criteria and qualification for for radiofrequency ablation and we are going to complete this today hopefully for more longer term relief as part of comprehensive management program.   ROS Otherwise per HPI.  Assessment & Plan: Visit Diagnoses:    ICD-10-CM   1. Spondylosis without myelopathy or radiculopathy, lumbar region  M47.816 XR C-ARM NO REPORT    Radiofrequency,Lumbar    methylPREDNISolone  acetate (DEPO-MEDROL ) injection 40 mg      Plan: No additional findings.   Meds & Orders:  Meds ordered this encounter  Medications   methylPREDNISolone  acetate (DEPO-MEDROL ) injection 40 mg    Orders Placed This Encounter  Procedures   Radiofrequency,Lumbar   XR C-ARM NO REPORT    Follow-up: Return if symptoms worsen or fail to improve.   Procedures: No procedures performed  Lumbar Facet Joint Nerve Denervation  Patient: Paul [name]      Date of Birth: 05-Aug-1956 MRN: [id number] PCP: Ransom Other, MD      Visit Date: 10/13/2024   Universal Protocol:    Date/Time: 11/18/256:56 PM  Consent Given By: the patient  Position: PRONE  Additional Comments: Vital signs were monitored before and after the procedure. Patient was prepped and draped in the usual sterile fashion. The correct patient, procedure, and site was verified.   Injection Procedure Details:   Procedure diagnoses:  1. Spondylosis without myelopathy or radiculopathy, lumbar region      Meds Administered:  Meds ordered this encounter  Medications   methylPREDNISolone  acetate (DEPO-MEDROL ) injection 40 mg     Laterality: Right  Location/Site:  L2-L3, L1 and L2 medial branches and L3-L4, L2 and L3 medial branches  Needle: 18 ga.,  10mm active tip, RF Cannula  Needle Placement: Along juncture of superior articular process and transverse pocess  Findings:  -Comments:  Procedure Details: For each desired target nerve, the corresponding transverse process (sacral ala for the L5 dorsal rami) was identified and the fluoroscope was positioned to square off the endplates of the corresponding vertebral body to achieve a true AP midline view.  The beam was then obliqued 15 to 20 degrees and caudally tilted 15 to 20 degrees to line up a trajectory along the target nerves. The skin over the target of the junction of superior articulating process and transverse process (sacral ala for the L5 dorsal rami) was infiltrated with 1ml of 1% Lidocaine  without Epinephrine.  The 18 gauge 10mm active tip outer cannula was advanced in trajectory view to the target.  This procedure was repeated for each target nerve.  Then, for all levels, the outer cannula placement was fine-tuned and the position was then confirmed with bi-planar imaging.    Test stimulation was done both at sensory and motor levels to ensure there was no radicular stimulation. The  target tissues were then infiltrated with 1 ml of 1% Lidocaine  without Epinephrine. Subsequently, a percutaneous neurotomy was carried out for 90 seconds at 80 degrees Celsius.  After the completion of the lesion, 1 ml of injectate was delivered. It was then repeated for each facet joint nerve mentioned above. Appropriate radiographs were obtained to verify the probe placement during the neurotomy.   Additional Comments:  No complications occurred Dressing: 2 x 2 sterile gauze and Band-Aid    Post-procedure details: Patient was observed during the procedure. Post-procedure instructions were reviewed.  Patient left the clinic in stable condition.      Clinical History: CLINICAL DATA:  Low back pain for 6 years.  No known injury.   EXAM: MRI LUMBAR SPINE WITHOUT CONTRAST   TECHNIQUE: Multiplanar, multisequence MR imaging of the lumbar spine was performed. No intravenous contrast was administered.   COMPARISON:  None Available.   FINDINGS: Segmentation:  Standard.   Alignment: 2 mm retrolisthesis of L1 on L2, L2 on L3, L3 on L4 and L4 on L5. mild dextro curvature of the thoracolumbar spine.   Vertebrae: No acute fracture, evidence of discitis, or aggressive bone lesion.   Conus medullaris and cauda equina: Conus extends to the T12-L1 level. Conus and cauda equina appear normal.   Paraspinal and other soft tissues: No acute paraspinal abnormality.   Disc levels:   Disc spaces: Degenerative disease with disc height loss at T11-12, T12-L1, L1-2, L2-3, L4-5 and L5-S1. Severe reactive endplate edema at O8-7.   T12-L1: Mild broad-based disc bulge. No foraminal or central canal stenosis.   L1-L2: Broad-based disc osteophyte complex. Small left paracentral disc protrusion. Mild bilateral facet arthropathy. Moderate-severe left foraminal stenosis. Moderate-severe scratch them moderate right foraminal stenosis. Scratch them moderate right foraminal stenosis. Minimal spinal  stenosis. Bilateral lateral recess stenosis.   L2-L3: Broad-based disc bulge. Mild bilateral facet arthropathy. Mild left foraminal stenosis. No right foraminal stenosis. No spinal stenosis.   L3-L4: Broad-based disc bulge. Moderate bilateral facet arthropathy. Right lateral recess stenosis. Moderate right foraminal stenosis. No left foraminal stenosis. Mild spinal stenosis.   L4-L5: Broad-based disc bulge. Mild bilateral facet arthropathy. Moderate right foraminal stenosis. No left foraminal stenosis. No spinal stenosis.   L5-S1: Broad-based disc bulge with a small right foraminal disc protrusion contacting the right exiting L5 nerve root. Mild bilateral facet arthropathy. Moderate right foraminal stenosis. Mild left foraminal stenosis.   IMPRESSION: 1. At L1-2 there is a broad-based disc osteophyte complex. Small left paracentral disc protrusion. Mild bilateral facet arthropathy. Moderate-severe left foraminal stenosis. Moderate-severe scratch them moderate right foraminal stenosis. Bilateral lateral recess stenosis. 2. At L3-4 there is a broad-based disc bulge. Moderate bilateral facet arthropathy. Right lateral recess stenosis. Moderate right foraminal stenosis. No left foraminal stenosis. Mild spinal stenosis. 3. At L4-5 there is a broad-based disc bulge. Mild bilateral facet arthropathy. Moderate right foraminal stenosis. 4. At L5-S1 there is a broad-based disc bulge with a small right foraminal disc protrusion contacting the right exiting L5 nerve root. Mild bilateral facet arthropathy. Moderate right foraminal stenosis. Mild left foraminal stenosis. 5. No acute osseous injury  of the lumbar spine.     Electronically Signed   By: Julaine Blanch M.D.   On: 10/25/2022 09:09     Objective:  VS:  HT:    WT:   BMI:     BP:129/70  HR:64bpm  TEMP: ( )  RESP:  Physical Exam Vitals and nursing note reviewed.  Constitutional:      General: He is not in acute  distress.    Appearance: Normal appearance. He is not ill-appearing.  HENT:     Head: Normocephalic and atraumatic.     Right Ear: External ear normal.     Left Ear: External ear normal.     Nose: No congestion.  Eyes:     Extraocular Movements: Extraocular movements intact.  Cardiovascular:     Rate and Rhythm: Normal rate.     Pulses: Normal pulses.  Pulmonary:     Effort: Pulmonary effort is normal. No respiratory distress.  Abdominal:     General: There is no distension.     Palpations: Abdomen is soft.  Musculoskeletal:        General: No tenderness or signs of injury.     Cervical back: Neck supple.     Right lower leg: No edema.     Left lower leg: No edema.     Comments: Patient has good distal strength without clonus.  Skin:    Findings: No erythema or rash.  Neurological:     General: No focal deficit present.     Mental Status: He is alert and oriented to person, place, and time.     Sensory: No sensory deficit.     Motor: No weakness or abnormal muscle tone.     Coordination: Coordination normal.  Psychiatric:        Mood and Affect: Mood normal.        Behavior: Behavior normal.      Imaging: No results found.

## 2024-10-21 NOTE — Telephone Encounter (Signed)
 Mailed application for repatha 

## 2024-10-26 ENCOUNTER — Other Ambulatory Visit (HOSPITAL_COMMUNITY): Payer: Self-pay

## 2024-10-26 NOTE — Telephone Encounter (Signed)
 Enbrel  PAP renewal forms transcribed to updated forms. Provider form placed in [name], PA-C's folder for signature. PA active through 12/24/2024  PAP submission pending provider sig  Sherry Pennant, PharmD, MPH, BCPS, CPP Clinical Pharmacist [facility])

## 2024-10-27 ENCOUNTER — Other Ambulatory Visit: Payer: Self-pay

## 2024-10-27 ENCOUNTER — Other Ambulatory Visit: Payer: Self-pay | Admitting: Physician Assistant

## 2024-10-27 ENCOUNTER — Ambulatory Visit: Admitting: Physical Medicine and Rehabilitation

## 2024-10-27 VITALS — BP 130/77 | HR 56

## 2024-10-27 DIAGNOSIS — M0600 Rheumatoid arthritis without rheumatoid factor, unspecified site: Secondary | ICD-10-CM

## 2024-10-27 DIAGNOSIS — Z79899 Other long term (current) drug therapy: Secondary | ICD-10-CM

## 2024-10-27 DIAGNOSIS — M47816 Spondylosis without myelopathy or radiculopathy, lumbar region: Secondary | ICD-10-CM

## 2024-10-27 MED ORDER — ENBREL MINI 50 MG/ML ~~LOC~~ SOCT
50.0000 mg | SUBCUTANEOUS | 2 refills | Status: AC
  Filled 2024-10-27 – 2024-10-30 (×2): qty 4, 28d supply, fill #0
  Filled 2024-11-30: qty 4, 28d supply, fill #1
  Filled 2024-12-25 – 2025-01-06 (×4): qty 4, 28d supply, fill #2

## 2024-10-27 MED ORDER — METHYLPREDNISOLONE ACETATE 40 MG/ML IJ SUSP
40.0000 mg | Freq: Once | INTRAMUSCULAR | Status: AC
  Administered 2024-10-27: 40 mg

## 2024-10-27 NOTE — Telephone Encounter (Signed)
 Received signed provider form from [name], PA-C  Submitted Patient Assistance Application to [employer] for ENBREL  with patient portion, provider portion, insurance card copy, prior authorization approval, and current medication list. Will update patient when we receive a response.  Phone #: [phone] Fax #: [phone]   Patient is filling Enbrel  through PAF grant this year. PAP is for 2026 calendar year

## 2024-10-27 NOTE — Progress Notes (Signed)
 Pain Scale   Average Pain 4 Patient advised he has lower back pain that is constant..        +Driver, -BT, -Dye Allergies.

## 2024-10-27 NOTE — Telephone Encounter (Signed)
 Last Fill: 07/30/2024  Labs: 07/28/2024 Glucose   TB Gold: 05/13/2024 Neg    Next Visit: 12/15/2024  Last Visit: 06/30/2024  DX: Seronegative rheumatoid arthritis   Current Dose per office note 06/30/2024: Enbrel  50 mg sq injections once weekly   Patient's wife advised patient is due to update labs. She states patient will go tomorrow to update. Lab orders released.   Okay to refill Enbrel ?

## 2024-10-27 NOTE — Progress Notes (Signed)
 Paul [name] - 68 y.o. male MRN [id number]  Date of birth: 20-May-1956  Office Visit Note: Visit Date: 10/27/2024 PCP: Ransom Other, MD Referred by: Ransom Other, MD  Subjective: Chief Complaint  Patient presents with   Lower Back - Pain   HPI:  Paul [name] is a 68 y.o. male who comes in todayfor planned radiofrequency ablation of the Left L2-3 and L3-4 Lumbar facet joints. This would be ablation of the corresponding medial branches and/or dorsal rami.  Patient has had double diagnostic blocks with more than 50% relief.  These are documented on pain diary.  They have had chronic back pain for quite some time, more than 3 months, which has been an ongoing situation with recalcitrant axial back pain.  They have no radicular pain.  Their axial pain is worse with standing and ambulating and on exam today with facet loading.  They have had physical therapy as well as home exercise program.  The imaging noted in the chart below indicated facet pathology. Accordingly they meet all the criteria and qualification for for radiofrequency ablation and we are going to complete this today hopefully for more longer term relief as part of comprehensive management program.  He reports some itching sensation on the right side postprocedure.  There is no rash his wife said there is no discoloration.  He really reports no itching prior to that.  This could be some type of dysesthesia from the procedure as this has been reported in the past but she without itching sensation.  Could be dry skin and may be a mild reaction to the ChloraPrep although no rash observed.  Will watch this as it goes along.   ROS Otherwise per HPI.  Assessment & Plan: Visit Diagnoses:    ICD-10-CM   1. Spondylosis without myelopathy or radiculopathy, lumbar region  M47.816 XR C-ARM NO REPORT    Radiofrequency,Lumbar    methylPREDNISolone  acetate (DEPO-MEDROL ) injection 40 mg      Plan: No additional findings.   Meds  & Orders:  Meds ordered this encounter  Medications   methylPREDNISolone  acetate (DEPO-MEDROL ) injection 40 mg    Orders Placed This Encounter  Procedures   Radiofrequency,Lumbar   XR C-ARM NO REPORT    Follow-up: Return for visit to requesting provider as needed.   Procedures: No procedures performed  Lumbar Facet Joint Nerve Denervation  Patient: Paul [name]      Date of Birth: [birth year] MRN: [id number] PCP: Ransom Other, MD      Visit Date: 10/27/2024   Universal Protocol:    Date/Time: 11/25/259:16 PM  Consent Given By: the patient  Position: PRONE  Additional Comments: Vital signs were monitored before and after the procedure. Patient was prepped and draped in the usual sterile fashion. The correct patient, procedure, and site was verified.   Injection Procedure Details:   Procedure diagnoses:  1. Spondylosis without myelopathy or radiculopathy, lumbar region      Meds Administered:  Meds ordered this encounter  Medications   methylPREDNISolone  acetate (DEPO-MEDROL ) injection 40 mg     Laterality: Left  Location/Site:  L2-L3, L1 and L2 medial branches and L3-L4, L2 and L3 medial branches  Needle: 18 ga.,  10mm active tip, RF Cannula  Needle Placement: Along juncture of superior articular process and transverse pocess  Findings:  -Comments:  Procedure Details: For each desired target nerve, the corresponding transverse process (sacral ala for the L5 dorsal rami) was identified and the fluoroscope was positioned to  square off the endplates of the corresponding vertebral body to achieve a true AP midline view.  The beam was then obliqued 15 to 20 degrees and caudally tilted 15 to 20 degrees to line up a trajectory along the target nerves. The skin over the target of the junction of superior articulating process and transverse process (sacral ala for the L5 dorsal rami) was infiltrated with 1ml of 1% Lidocaine  without Epinephrine.  The 18  gauge 10mm active tip outer cannula was advanced in trajectory view to the target.  This procedure was repeated for each target nerve.  Then, for all levels, the outer cannula placement was fine-tuned and the position was then confirmed with bi-planar imaging.    Test stimulation was done both at sensory and motor levels to ensure there was no radicular stimulation. The target tissues were then infiltrated with 1 ml of 1% Lidocaine  without Epinephrine. Subsequently, a percutaneous neurotomy was carried out for 90 seconds at 80 degrees Celsius.  After the completion of the lesion, 1 ml of injectate was delivered. It was then repeated for each facet joint nerve mentioned above. Appropriate radiographs were obtained to verify the probe placement during the neurotomy.   Additional Comments:  The patient tolerated the procedure well Dressing: 2 x 2 sterile gauze and Band-Aid    Post-procedure details: Patient was observed during the procedure. Post-procedure instructions were reviewed.  Patient left the clinic in stable condition.      Clinical History: CLINICAL DATA:  Low back pain for 6 years.  No known injury.   EXAM: MRI LUMBAR SPINE WITHOUT CONTRAST   TECHNIQUE: Multiplanar, multisequence MR imaging of the lumbar spine was performed. No intravenous contrast was administered.   COMPARISON:  None Available.   FINDINGS: Segmentation:  Standard.   Alignment: 2 mm retrolisthesis of L1 on L2, L2 on L3, L3 on L4 and L4 on L5. mild dextro curvature of the thoracolumbar spine.   Vertebrae: No acute fracture, evidence of discitis, or aggressive bone lesion.   Conus medullaris and cauda equina: Conus extends to the T12-L1 level. Conus and cauda equina appear normal.   Paraspinal and other soft tissues: No acute paraspinal abnormality.   Disc levels:   Disc spaces: Degenerative disease with disc height loss at T11-12, T12-L1, L1-2, L2-3, L4-5 and L5-S1. Severe reactive endplate  edema at L1-2.   T12-L1: Mild broad-based disc bulge. No foraminal or central canal stenosis.   L1-L2: Broad-based disc osteophyte complex. Small left paracentral disc protrusion. Mild bilateral facet arthropathy. Moderate-severe left foraminal stenosis. Moderate-severe scratch them moderate right foraminal stenosis. Scratch them moderate right foraminal stenosis. Minimal spinal stenosis. Bilateral lateral recess stenosis.   L2-L3: Broad-based disc bulge. Mild bilateral facet arthropathy. Mild left foraminal stenosis. No right foraminal stenosis. No spinal stenosis.   L3-L4: Broad-based disc bulge. Moderate bilateral facet arthropathy. Right lateral recess stenosis. Moderate right foraminal stenosis. No left foraminal stenosis. Mild spinal stenosis.   L4-L5: Broad-based disc bulge. Mild bilateral facet arthropathy. Moderate right foraminal stenosis. No left foraminal stenosis. No spinal stenosis.   L5-S1: Broad-based disc bulge with a small right foraminal disc protrusion contacting the right exiting L5 nerve root. Mild bilateral facet arthropathy. Moderate right foraminal stenosis. Mild left foraminal stenosis.   IMPRESSION: 1. At L1-2 there is a broad-based disc osteophyte complex. Small left paracentral disc protrusion. Mild bilateral facet arthropathy. Moderate-severe left foraminal stenosis. Moderate-severe scratch them moderate right foraminal stenosis. Bilateral lateral recess stenosis. 2. At L3-4 there is a broad-based disc  bulge. Moderate bilateral facet arthropathy. Right lateral recess stenosis. Moderate right foraminal stenosis. No left foraminal stenosis. Mild spinal stenosis. 3. At L4-5 there is a broad-based disc bulge. Mild bilateral facet arthropathy. Moderate right foraminal stenosis. 4. At L5-S1 there is a broad-based disc bulge with a small right foraminal disc protrusion contacting the right exiting L5 nerve root. Mild bilateral facet arthropathy.  Moderate right foraminal stenosis. Mild left foraminal stenosis. 5. No acute osseous injury of the lumbar spine.     Electronically Signed   By: Julaine Blanch M.D.   On: 10/25/2022 09:09     Objective:  VS:  HT:    WT:   BMI:     BP:130/77  HR:(!) 56bpm  TEMP: ( )  RESP:  Physical Exam Vitals and nursing note reviewed.  Constitutional:      General: He is not in acute distress.    Appearance: Normal appearance. He is not ill-appearing.  HENT:     Head: Normocephalic and atraumatic.     Right Ear: External ear normal.     Left Ear: External ear normal.     Nose: No congestion.  Eyes:     Extraocular Movements: Extraocular movements intact.  Cardiovascular:     Rate and Rhythm: Normal rate.     Pulses: Normal pulses.  Pulmonary:     Effort: Pulmonary effort is normal. No respiratory distress.  Abdominal:     General: There is no distension.     Palpations: Abdomen is soft.  Musculoskeletal:        General: No tenderness or signs of injury.     Cervical back: Neck supple.     Right lower leg: No edema.     Left lower leg: No edema.     Comments: Patient has good distal strength without clonus.  Skin:    Findings: No erythema or rash.  Neurological:     General: No focal deficit present.     Mental Status: He is alert and oriented to person, place, and time.     Sensory: No sensory deficit.     Motor: No weakness or abnormal muscle tone.     Coordination: Coordination normal.  Psychiatric:        Mood and Affect: Mood normal.        Behavior: Behavior normal.      Imaging: XR C-ARM NO REPORT Result Date: 10/27/2024 Please see Notes tab for imaging impression.

## 2024-10-27 NOTE — Procedures (Signed)
 Lumbar Facet Joint Nerve Denervation  Patient: Paul [name]      Date of Birth: 1956/06/07 MRN: [id number] PCP: Ransom Other, MD      Visit Date: 10/27/2024   Universal Protocol:    Date/Time: 11/25/259:16 PM  Consent Given By: the patient  Position: PRONE  Additional Comments: Vital signs were monitored before and after the procedure. Patient was prepped and draped in the usual sterile fashion. The correct patient, procedure, and site was verified.   Injection Procedure Details:   Procedure diagnoses:  1. Spondylosis without myelopathy or radiculopathy, lumbar region      Meds Administered:  Meds ordered this encounter  Medications   methylPREDNISolone  acetate (DEPO-MEDROL ) injection 40 mg     Laterality: Left  Location/Site:  L2-L3, L1 and L2 medial branches and L3-L4, L2 and L3 medial branches  Needle: 18 ga.,  10mm active tip, RF Cannula  Needle Placement: Along juncture of superior articular process and transverse pocess  Findings:  -Comments:  Procedure Details: For each desired target nerve, the corresponding transverse process (sacral ala for the L5 dorsal rami) was identified and the fluoroscope was positioned to square off the endplates of the corresponding vertebral body to achieve a true AP midline view.  The beam was then obliqued 15 to 20 degrees and caudally tilted 15 to 20 degrees to line up a trajectory along the target nerves. The skin over the target of the junction of superior articulating process and transverse process (sacral ala for the L5 dorsal rami) was infiltrated with 1ml of 1% Lidocaine  without Epinephrine.  The 18 gauge 10mm active tip outer cannula was advanced in trajectory view to the target.  This procedure was repeated for each target nerve.  Then, for all levels, the outer cannula placement was fine-tuned and the position was then confirmed with bi-planar imaging.    Test stimulation was done both at sensory and motor  levels to ensure there was no radicular stimulation. The target tissues were then infiltrated with 1 ml of 1% Lidocaine  without Epinephrine. Subsequently, a percutaneous neurotomy was carried out for 90 seconds at 80 degrees Celsius.  After the completion of the lesion, 1 ml of injectate was delivered. It was then repeated for each facet joint nerve mentioned above. Appropriate radiographs were obtained to verify the probe placement during the neurotomy.   Additional Comments:  The patient tolerated the procedure well Dressing: 2 x 2 sterile gauze and Band-Aid    Post-procedure details: Patient was observed during the procedure. Post-procedure instructions were reviewed.  Patient left the clinic in stable condition.

## 2024-10-28 ENCOUNTER — Other Ambulatory Visit (HOSPITAL_COMMUNITY): Payer: Self-pay

## 2024-10-29 LAB — CMP14+EGFR
ALT: 21 IU/L (ref 0–44)
AST: 22 IU/L (ref 0–40)
Albumin: 4.5 g/dL (ref 3.9–4.9)
Alkaline Phosphatase: 99 IU/L (ref 47–123)
BUN/Creatinine Ratio: 15 (ref 10–24)
BUN: 17 mg/dL (ref 8–27)
Bilirubin Total: 0.6 mg/dL (ref 0.0–1.2)
CO2: 24 mmol/L (ref 20–29)
Calcium: 9.5 mg/dL (ref 8.6–10.2)
Chloride: 103 mmol/L (ref 96–106)
Creatinine, Ser: 1.1 mg/dL (ref 0.76–1.27)
Globulin, Total: 2.4 g/dL (ref 1.5–4.5)
Glucose: 109 mg/dL — ABNORMAL HIGH (ref 70–99)
Potassium: 4.9 mmol/L (ref 3.5–5.2)
Sodium: 139 mmol/L (ref 134–144)
Total Protein: 6.9 g/dL (ref 6.0–8.5)
eGFR: 74 mL/min/1.73 (ref >59)

## 2024-10-29 LAB — CBC WITH DIFFERENTIAL/PLATELET
Basophils Absolute: 0.1 x10E3/uL (ref 0.0–0.2)
Basos: 1 %
EOS (ABSOLUTE): 0.1 x10E3/uL (ref 0.0–0.4)
Eos: 1 %
Hematocrit: 42 % (ref 37.5–51.0)
Hemoglobin: 14 g/dL (ref 13.0–17.7)
Immature Grans (Abs): 0 x10E3/uL (ref 0.0–0.1)
Immature Granulocytes: 0 %
Lymphocytes Absolute: 1.2 x10E3/uL (ref 0.7–3.1)
Lymphs: 17 %
MCH: 31.9 pg (ref 26.6–33.0)
MCHC: 33.3 g/dL (ref 31.5–35.7)
MCV: 96 fL (ref 79–97)
Monocytes Absolute: 0.7 x10E3/uL (ref 0.1–0.9)
Monocytes: 10 %
Neutrophils Absolute: 5.3 x10E3/uL (ref 1.4–7.0)
Neutrophils: 71 %
Platelets: 243 x10E3/uL (ref 150–450)
RBC: 4.39 x10E6/uL (ref 4.14–5.80)
RDW: 12.3 % (ref 11.6–15.4)
WBC: 7.4 x10E3/uL (ref 3.4–10.8)

## 2024-10-30 ENCOUNTER — Ambulatory Visit: Payer: Self-pay | Admitting: Rheumatology

## 2024-10-30 ENCOUNTER — Other Ambulatory Visit (HOSPITAL_COMMUNITY): Payer: Self-pay

## 2024-10-30 ENCOUNTER — Other Ambulatory Visit: Payer: Self-pay

## 2024-10-30 NOTE — Telephone Encounter (Signed)
 Hi, can someone please print the [employer] repatha  application that is scanned in media and have the provider sign and either fax back to [phone] or put in the patient assistance mailbox? Thank you!

## 2024-11-02 NOTE — Telephone Encounter (Signed)
 Document printed and placed in Dr Inocencio box as Dr Delford has not seen patient since 2023.

## 2024-11-03 ENCOUNTER — Other Ambulatory Visit (HOSPITAL_COMMUNITY): Payer: Self-pay

## 2024-11-03 ENCOUNTER — Other Ambulatory Visit: Payer: Self-pay

## 2024-11-03 NOTE — Progress Notes (Signed)
 Specialty Pharmacy Refill Coordination Note  Paul [name] is a 68 y.o. male contacted today regarding refills of specialty medication(s) Etanercept  (Enbrel  Mini)   Patient requested Pickup at [facility] at [place] date: 11/09/24   Medication will be filled on: 11/06/24

## 2024-11-05 ENCOUNTER — Other Ambulatory Visit: Payer: Self-pay

## 2024-11-09 ENCOUNTER — Telehealth: Payer: Self-pay | Admitting: *Deleted

## 2024-11-09 ENCOUNTER — Other Ambulatory Visit (HOSPITAL_COMMUNITY): Payer: Self-pay

## 2024-11-09 NOTE — Telephone Encounter (Signed)
 Called and made DPR, Leah aware that paper work for [employer] has been faxed. Rea , DPR verbalized an understanding.

## 2024-11-09 NOTE — Telephone Encounter (Signed)
   I ran a test claim and indeed the repatha  is free on his insurance. I called the patient and left a message and sent a mychart

## 2024-11-18 ENCOUNTER — Telehealth: Payer: Self-pay | Admitting: *Deleted

## 2024-11-18 NOTE — Telephone Encounter (Signed)
 Patient's wife contacted the office to follow up on [employer] patient assistance application.

## 2024-11-19 NOTE — Telephone Encounter (Signed)
 Returned call to pt's wife, advised that we had not yet heard a response from [employer] at this time but that we would let her know once we did. I also requested that they notify us  if they get a response from [employer] before they have heard from us . She verbalized understanding to all.

## 2024-11-30 ENCOUNTER — Other Ambulatory Visit: Payer: Self-pay

## 2024-11-30 ENCOUNTER — Telehealth: Payer: Self-pay | Admitting: Physical Medicine and Rehabilitation

## 2024-11-30 ENCOUNTER — Other Ambulatory Visit (HOSPITAL_COMMUNITY): Payer: Self-pay

## 2024-11-30 NOTE — Progress Notes (Signed)
 Specialty Pharmacy Refill Coordination Note  Paul [name] is a 68 y.o. male contacted today regarding refills of specialty medication(s) Etanercept  (Enbrel  Mini)   Patient requested (Patient-Rptd) Pickup at [facility] at [facility] date: (Patient-Rptd) 12/01/24   Medication will be filled on: 11/30/24

## 2024-11-30 NOTE — Telephone Encounter (Signed)
 Pt's wife Rea asked for a call back concerning pt's pain. Unsure to see [facility] Or Megan. Pt having severe pains in back. Last injection 10/27/24. Pleas call Leah at [phone].

## 2024-12-01 NOTE — Telephone Encounter (Signed)
 Per automated system at [employer], all 2026 applications will be processed in 2026 (determination will be made no sooner than 2026)
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
## 2024-12-08 ENCOUNTER — Other Ambulatory Visit: Payer: Self-pay | Admitting: Nurse Practitioner

## 2024-12-08 ENCOUNTER — Encounter: Payer: Self-pay | Admitting: Physical Medicine and Rehabilitation

## 2024-12-08 ENCOUNTER — Ambulatory Visit: Admitting: Physical Medicine and Rehabilitation

## 2024-12-08 DIAGNOSIS — M5442 Lumbago with sciatica, left side: Secondary | ICD-10-CM | POA: Diagnosis not present

## 2024-12-08 DIAGNOSIS — E785 Hyperlipidemia, unspecified: Secondary | ICD-10-CM

## 2024-12-08 DIAGNOSIS — M5416 Radiculopathy, lumbar region: Secondary | ICD-10-CM | POA: Diagnosis not present

## 2024-12-08 MED ORDER — MELOXICAM 15 MG PO TABS: 15.0000 mg | ORAL_TABLET | Freq: Every day | ORAL | 0 refills | Status: AC

## 2024-12-08 MED ORDER — PREDNISONE 50 MG PO TABS: 50.0000 mg | ORAL_TABLET | Freq: Every day | ORAL | 0 refills | Status: DC

## 2024-12-08 NOTE — Progress Notes (Signed)
 Pain Scale   Average Pain 4 Patient advising he has lower back pain below the area that he has RFA done in November and pain radiates to left lower leg and Hip.        +Driver, -BT, -Dye Allergies.
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
## 2024-12-15 ENCOUNTER — Ambulatory Visit: Attending: Rheumatology | Admitting: Rheumatology

## 2024-12-15 ENCOUNTER — Encounter: Payer: Self-pay | Admitting: Rheumatology

## 2024-12-15 VITALS — BP 117/79 | HR 66 | Temp 98.1°F | Resp 14 | Ht 72.0 in | Wt 183.6 lb

## 2024-12-15 DIAGNOSIS — M51369 Other intervertebral disc degeneration, lumbar region without mention of lumbar back pain or lower extremity pain: Secondary | ICD-10-CM

## 2024-12-15 DIAGNOSIS — M19041 Primary osteoarthritis, right hand: Secondary | ICD-10-CM

## 2024-12-15 DIAGNOSIS — M5030 Other cervical disc degeneration, unspecified cervical region: Secondary | ICD-10-CM

## 2024-12-15 DIAGNOSIS — R5383 Other fatigue: Secondary | ICD-10-CM

## 2024-12-15 DIAGNOSIS — M0600 Rheumatoid arthritis without rheumatoid factor, unspecified site: Secondary | ICD-10-CM | POA: Diagnosis not present

## 2024-12-15 DIAGNOSIS — F32A Depression, unspecified: Secondary | ICD-10-CM

## 2024-12-15 DIAGNOSIS — M19042 Primary osteoarthritis, left hand: Secondary | ICD-10-CM

## 2024-12-15 DIAGNOSIS — Z8639 Personal history of other endocrine, nutritional and metabolic disease: Secondary | ICD-10-CM | POA: Diagnosis not present

## 2024-12-15 DIAGNOSIS — Z8659 Personal history of other mental and behavioral disorders: Secondary | ICD-10-CM | POA: Diagnosis not present

## 2024-12-15 DIAGNOSIS — R748 Abnormal levels of other serum enzymes: Secondary | ICD-10-CM | POA: Diagnosis not present

## 2024-12-15 DIAGNOSIS — Z87438 Personal history of other diseases of male genital organs: Secondary | ICD-10-CM | POA: Diagnosis not present

## 2024-12-15 DIAGNOSIS — M19071 Primary osteoarthritis, right ankle and foot: Secondary | ICD-10-CM | POA: Diagnosis not present

## 2024-12-15 DIAGNOSIS — Z8679 Personal history of other diseases of the circulatory system: Secondary | ICD-10-CM | POA: Diagnosis not present

## 2024-12-15 DIAGNOSIS — Z79899 Other long term (current) drug therapy: Secondary | ICD-10-CM

## 2024-12-15 DIAGNOSIS — M19072 Primary osteoarthritis, left ankle and foot: Secondary | ICD-10-CM

## 2024-12-15 DIAGNOSIS — F419 Anxiety disorder, unspecified: Secondary | ICD-10-CM

## 2024-12-15 NOTE — Telephone Encounter (Signed)
 Patient's wife contacted me today and informed me that, per rep she spoke with today, [employer] is no longer accepting new applicants with Medicare. We discussed alternative options for coverage, she is going to see about getting on to a waitlist for a grant. She will call me back if/when she makes a decision on how to proceed.

## 2024-12-15 NOTE — Patient Instructions (Addendum)
 Standing Labs We placed an order today for your standing lab work.   Please have your standing labs drawn in February and every 3 months  Please have your labs drawn 2 weeks prior to your appointment so that the provider can discuss your lab results at your appointment, if possible.  Please note that you may see your imaging and lab results in MyChart before we have reviewed them. We will contact you once all results are reviewed. Please allow our office up to 72 hours to thoroughly review all of the results before contacting the office for clarification of your results.  WALK-IN LAB HOURS  Monday through Thursday from 8:00 am - 4:30 pm and Friday from 8:00 am-12:00 pm.  Patients with office visits requiring labs will be seen before walk-in labs.  You may encounter longer than normal wait times. Please allow additional time. Wait times may be shorter on  Monday and Thursday afternoons.  We do not book appointments for walk-in labs. We appreciate your patience and understanding with our staff.   Labs are drawn by [facility]. Please bring your co-pay at the time of your lab draw.  You may receive a bill from [facility] for your lab work.  Please note if you are on Hydroxychloroquine  and and an order has been placed for a Hydroxychloroquine  level,  you will need to have it drawn 4 hours or more after your last dose.  If you wish to have your labs drawn at another location, please call the office 24 hours in advance so we can fax the orders.  The office is located at [address], [place], [place] [address]   If you have any questions regarding directions or hours of operation,  please call [phone].   As a reminder, please drink plenty of water prior to coming for your lab work. Thanks!   Vaccines You are taking a medication(s) that can suppress your immune system.  The following immunizations are recommended: Flu annually Covid-19  RSV Td/Tdap (tetanus, diphtheria, pertussis)  every 10 years Pneumonia (Prevnar 15 then Pneumovax 23 at least 1 year apart.  Alternatively, can take Prevnar 20 without needing additional dose) Shingrix: 2 doses from 4 weeks to 6 months apart  Please check with your PCP to make sure you are up to date.   If you have signs or symptoms of an infection or start antibiotics: First, call your PCP for workup of your infection. Hold your medication through the infection, until you complete your antibiotics, and until symptoms resolve if you take the following: Injectable medication (Actemra, Benlysta, Cimzia, Cosentyx, Enbrel , Humira, Kevzara, Orencia, Remicade, Simponi, Stelara, Taltz, Tremfya) Methotrexate  Leflunomide (Arava) Mycophenolate (Cellcept) Xeljanz, Olumiant, or Rinvoq   Please get an annual skin examination to screen for skin cancer.  Please use sunscreen and sun protection.  Low Back Sprain or Strain Rehab Ask your health care provider which exercises are safe for you. Do exercises exactly as told by your health care provider and adjust them as directed. It is normal to feel mild stretching, pulling, tightness, or discomfort as you do these exercises. Stop right away if you feel sudden pain or your pain gets worse. Do not begin these exercises until told by your health care provider. Stretching and range-of-motion exercises These exercises warm up your muscles and joints and improve the movement and flexibility of your back. These exercises also help to relieve pain, numbness, and tingling. Lumbar rotation  Lie on your back on a firm bed  or the floor with your knees bent. Straighten your arms out to your sides so each arm forms a 90-degree angle (right angle) with a side of your body. Slowly move (rotate) both of your knees to one side of your body until you feel a stretch in your lower back (lumbar). Try not to let your shoulders lift off the floor. Hold this position for __________ seconds. Tense your abdominal muscles and  slowly move your knees back to the starting position. Repeat this exercise on the other side of your body. Repeat __________ times. Complete this exercise __________ times a day. Single knee to chest  Lie on your back on a firm bed or the floor with both legs straight. Bend one of your knees. Use your hands to move your knee up toward your chest until you feel a gentle stretch in your lower back and buttock. Hold your leg in this position by holding on to the front of your knee. Keep your other leg as straight as possible. Hold this position for __________ seconds. Slowly return to the starting position. Repeat with your other leg. Repeat __________ times. Complete this exercise __________ times a day.   Pelvic tilt This exercise strengthens the muscles that lie deep in the abdomen. Lie on your back on a firm bed or the floor with your legs extended. Bend your knees so they are pointing toward the ceiling and your feet are flat on the floor. Tighten your lower abdominal muscles to press your lower back against the floor. This motion will tilt your pelvis so your tailbone points up toward the ceiling instead of pointing to your feet or the floor. To help with this exercise, you may place a small towel under your lower back and try to push your back into the towel. Hold this position for __________ seconds. Let your muscles relax completely before you repeat this exercise. Repeat __________ times. Complete this exercise __________ times a day. Alternating arm and leg raises  Get on your hands and knees on a firm surface. If you are on a hard floor, you may want to use padding, such as an exercise mat, to cushion your knees. Line up your arms and legs. Your hands should be directly below your shoulders, and your knees should be directly below your hips. Lift your left leg behind you. At the same time, raise your right arm and straighten it in front of you. Do not lift your leg higher than  your hip. Do not lift your arm higher than your shoulder. Keep your abdominal and back muscles tight. Keep your hips facing the ground. Do not arch your back. Keep your balance carefully, and do not hold your breath. Hold this position for __________ seconds. Slowly return to the starting position. Repeat with your right leg and your left arm. Repeat __________ times. Complete this exercise __________ times a day. Abdominal set with straight leg raise  Lie on your back on a firm bed or the floor. Bend one of your knees and keep your other leg straight. Tense your abdominal muscles and lift your straight leg up, 4-6 inches (10-15 cm) off the ground. Keep your abdominal muscles tight and hold this position for __________ seconds. Do not hold your breath. Do not arch your back. Keep it flat against the ground. Keep your abdominal muscles tense as you slowly lower your leg back to the starting position. Repeat with your other leg. Repeat __________ times. Complete this exercise __________ times a day.  Single leg lower with bent knees Lie on your back on a firm bed or the floor. Tense your abdominal muscles and lift your feet off the floor, one foot at a time, so your knees and hips are bent in 90-degree angles (right angles). Your knees should be over your hips and your lower legs should be parallel to the floor. Keeping your abdominal muscles tense and your knee bent, slowly lower one of your legs so your toe touches the ground. Lift your leg back up to return to the starting position. Do not hold your breath. Do not let your back arch. Keep your back flat against the ground. Repeat with your other leg. Repeat __________ times. Complete this exercise __________ times a day. Posture and body mechanics Good posture and healthy body mechanics can help to relieve stress in your body's tissues and joints. Body mechanics refers to the movements and positions of your body while you do your  daily activities. Posture is part of body mechanics. Good posture means: Your spine is in its natural S-curve position (neutral). Your shoulders are pulled back slightly. Your head is not tipped forward (neutral). Follow these guidelines to improve your posture and body mechanics in your everyday activities. Standing  When standing, keep your spine neutral and your feet about hip-width apart. Keep a slight bend in your knees. Your ears, shoulders, and hips should line up. When you do a task in which you stand in one place for a long time, place one foot up on a stable object that is 2-4 inches (5-10 cm) high, such as a footstool. This helps keep your spine neutral. Sitting  When sitting, keep your spine neutral and keep your feet flat on the floor. Use a footrest, if necessary, and keep your thighs parallel to the floor. Avoid rounding your shoulders, and avoid tilting your head forward. When working at a desk or a computer, keep your desk at a height where your hands are slightly lower than your elbows. Slide your chair under your desk so you are close enough to maintain good posture. When working at a computer, place your monitor at a height where you are looking straight ahead and you do not have to tilt your head forward or downward to look at the screen. Resting When lying down and resting, avoid positions that are most painful for you. If you have pain with activities such as sitting, bending, stooping, or squatting, lie in a position in which your body does not bend very much. For example, avoid curling up on your side with your arms and knees near your chest (fetal position). If you have pain with activities such as standing for a long time or reaching with your arms, lie with your spine in a neutral position and bend your knees slightly. Try the following positions: Lying on your side with a pillow between your knees. Lying on your back with a pillow under your knees. Lifting  When  lifting objects, keep your feet at least shoulder-width apart and tighten your abdominal muscles. Bend your knees and hips and keep your spine neutral. It is important to lift using the strength of your legs, not your back. Do not lock your knees straight out. Always ask for help to lift heavy or awkward objects. This information is not intended to replace advice given to you by your health care provider. Make sure you discuss any questions you have with your health care provider. Document Revised: 03/25/2023 Document Reviewed: 02/06/2021 Elsevier  Patient Education  2024 [employer].

## 2024-12-16 ENCOUNTER — Telehealth: Payer: Self-pay

## 2024-12-16 ENCOUNTER — Ambulatory Visit: Admitting: Physical Therapy

## 2024-12-16 DIAGNOSIS — M25652 Stiffness of left hip, not elsewhere classified: Secondary | ICD-10-CM | POA: Diagnosis not present

## 2024-12-16 DIAGNOSIS — M6281 Muscle weakness (generalized): Secondary | ICD-10-CM | POA: Diagnosis not present

## 2024-12-16 DIAGNOSIS — M5459 Other low back pain: Secondary | ICD-10-CM

## 2024-12-16 DIAGNOSIS — M25651 Stiffness of right hip, not elsewhere classified: Secondary | ICD-10-CM | POA: Diagnosis not present

## 2024-12-16 DIAGNOSIS — M25552 Pain in left hip: Secondary | ICD-10-CM

## 2024-12-16 NOTE — Telephone Encounter (Signed)
 Pt's wife informs me that pt's current PA for Enbrel  will expire on 12/24/24.  Attempted to submit a Prior Authorization request to [employer] for ENBREL  via CoverMyMeds, however request was cancelled stating that there is already an authorization on file. No further information was given, will wait for cancellation letter which should hopefully include approval dates and the ref# of the auth in question.   Key: [id number]
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
## 2024-12-17 ENCOUNTER — Encounter: Payer: Self-pay | Admitting: Cardiovascular Disease

## 2024-12-17 ENCOUNTER — Telehealth: Payer: Self-pay | Admitting: Pharmacy Technician

## 2024-12-17 ENCOUNTER — Other Ambulatory Visit (HOSPITAL_COMMUNITY): Payer: Self-pay

## 2024-12-17 NOTE — Telephone Encounter (Signed)
 Pharmacy Patient Advocate Encounter    Received notification from Patient Advice Request messages that prior authorization for repatha  is required/requested.   Insurance verification completed.   The patient is insured through [facility] [employer].   Per pa:[id number]

## 2024-12-22 ENCOUNTER — Other Ambulatory Visit (HOSPITAL_COMMUNITY): Payer: Self-pay

## 2024-12-24 ENCOUNTER — Ambulatory Visit: Admitting: Rehabilitative and Restorative Service Providers"

## 2024-12-24 ENCOUNTER — Encounter: Payer: Self-pay | Admitting: Rehabilitative and Restorative Service Providers"

## 2024-12-24 DIAGNOSIS — M25651 Stiffness of right hip, not elsewhere classified: Secondary | ICD-10-CM

## 2024-12-24 DIAGNOSIS — M25652 Stiffness of left hip, not elsewhere classified: Secondary | ICD-10-CM

## 2024-12-24 DIAGNOSIS — M6281 Muscle weakness (generalized): Secondary | ICD-10-CM | POA: Diagnosis not present

## 2024-12-24 DIAGNOSIS — M5459 Other low back pain: Secondary | ICD-10-CM
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
## 2024-12-25 ENCOUNTER — Other Ambulatory Visit (HOSPITAL_COMMUNITY): Payer: Self-pay

## 2024-12-25 ENCOUNTER — Other Ambulatory Visit: Payer: Self-pay

## 2024-12-25 NOTE — Progress Notes (Signed)
 Benefits Investigation Started  Reason: PA Required  Routed to: Rx Rheum/Pulm [place])

## 2024-12-28 NOTE — Telephone Encounter (Signed)
 Cancellation letter was never received, however [facility] pharmacy sent notice that patient requires a new authorization for their medication.  Submitted an URGENT Prior Authorization request to [facility] [employer] for ENBREL  via CoverMyMeds. Will update once we receive a response.  Key: [id number]

## 2024-12-29 ENCOUNTER — Other Ambulatory Visit: Payer: Self-pay

## 2024-12-29 NOTE — Telephone Encounter (Signed)
 Received notification from [employer] regarding a prior authorization for ENBREL . Authorization has been APPROVED from 12/28/2024 to 12/28/2025. Approval letter sent to scan center.  Authorization # [id number]   BIV task will be completed in CR and a message will be sent to call center staff.

## 2024-12-30 ENCOUNTER — Encounter: Admitting: Physical Therapy

## 2024-12-31 NOTE — Telephone Encounter (Signed)
 Medication Samples have been provided to the patient.  Drug name: Enbrel  Mini       Strength: 50 mg        Qty: 1  LOT: [id number]  Exp.Date: 07/02/2026  Dosing instructions: Inject one cartridge into skin once weekly.

## 2024-12-31 NOTE — Telephone Encounter (Signed)
 Pt came to clinic and picked up Enbrel  Mini sample, signed out of logbook #1. He also confirmed that he did in fact request a replacement [employer] from [employer]. Signed replacement form faxed in.

## 2025-01-05 ENCOUNTER — Other Ambulatory Visit: Payer: Self-pay

## 2025-01-05 ENCOUNTER — Telehealth: Payer: Self-pay | Admitting: Pharmacy Technician

## 2025-01-05 ENCOUNTER — Other Ambulatory Visit (HOSPITAL_COMMUNITY): Payer: Self-pay

## 2025-01-05 ENCOUNTER — Telehealth: Payer: Self-pay

## 2025-01-05 NOTE — Telephone Encounter (Signed)
" ° °  Pharmacy Patient Advocate Encounter   Received notification from [facility] that prior authorization for repatha  is required/requested.   Insurance verification completed.   The patient is insured through [facility] [employer]VANT[employer]/RX ADVANCE.   Per test claim: PA required; PA submitted to above mentioned insurance via Latent Key/confirmation #/EOC [id number] Status is pending  "

## 2025-01-06 ENCOUNTER — Other Ambulatory Visit: Payer: Self-pay

## 2025-01-06 ENCOUNTER — Encounter: Payer: Self-pay | Admitting: Physical Therapy

## 2025-01-06 ENCOUNTER — Ambulatory Visit: Admitting: Physical Therapy

## 2025-01-06 DIAGNOSIS — M25652 Stiffness of left hip, not elsewhere classified: Secondary | ICD-10-CM

## 2025-01-06 DIAGNOSIS — M5459 Other low back pain: Secondary | ICD-10-CM | POA: Diagnosis not present

## 2025-01-06 DIAGNOSIS — M25651 Stiffness of right hip, not elsewhere classified: Secondary | ICD-10-CM | POA: Diagnosis not present

## 2025-01-06 DIAGNOSIS — M6281 Muscle weakness (generalized): Secondary | ICD-10-CM

## 2025-01-06 DIAGNOSIS — M25552 Pain in left hip: Secondary | ICD-10-CM

## 2025-01-06 NOTE — Progress Notes (Signed)
 Specialty Pharmacy Refill Coordination Note  Paul [name] is a 69 y.o. male contacted today regarding refills of specialty medication(s) Etanercept  (Enbrel  Mini)   Patient requested Pickup at [facility] [place] date: 01/11/25   Medication will be filled on: 01/08/25
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
## 2025-01-06 NOTE — Telephone Encounter (Signed)
 Pharmacy Patient Advocate Encounter  Received notification from [employer] that Prior Authorization for repatha  has been APPROVED from 01/05/25 to 01/05/26   PA #/Case ID/Reference #: [id number]

## 2025-01-08 ENCOUNTER — Other Ambulatory Visit: Payer: Self-pay

## 2025-01-13 ENCOUNTER — Encounter: Admitting: Physical Therapy

## 2025-01-20 ENCOUNTER — Encounter: Admitting: Physical Therapy

## 2025-01-27 ENCOUNTER — Encounter: Admitting: Physical Therapy

## 2025-05-19 ENCOUNTER — Ambulatory Visit: Admitting: Rheumatology
